# Patient Record
Sex: Female | Born: 1990 | Race: Black or African American | Hispanic: No | Marital: Married | Smoking: Current every day smoker
Health system: Southern US, Community
[De-identification: ages and names within clinical notes are randomized; demographics above are authoritative.]

## PROBLEM LIST (undated history)

## (undated) ENCOUNTER — Inpatient Hospital Stay (HOSPITAL_COMMUNITY): Payer: Self-pay

## (undated) ENCOUNTER — Emergency Department (HOSPITAL_COMMUNITY): Admission: EM | Payer: Medicaid Other

## (undated) ENCOUNTER — Emergency Department (HOSPITAL_COMMUNITY): Payer: Medicaid Other

## (undated) DIAGNOSIS — O139 Gestational [pregnancy-induced] hypertension without significant proteinuria, unspecified trimester: Secondary | ICD-10-CM

## (undated) DIAGNOSIS — F32A Depression, unspecified: Secondary | ICD-10-CM

## (undated) DIAGNOSIS — Z8614 Personal history of Methicillin resistant Staphylococcus aureus infection: Secondary | ICD-10-CM

## (undated) DIAGNOSIS — F329 Major depressive disorder, single episode, unspecified: Secondary | ICD-10-CM

---

## 2013-05-08 ENCOUNTER — Emergency Department (INDEPENDENT_AMBULATORY_CARE_PROVIDER_SITE_OTHER)
Admission: EM | Admit: 2013-05-08 | Discharge: 2013-05-08 | Disposition: A | Discharge status: Home / Self Care | Source: Home / Self Care

## 2013-05-08 ENCOUNTER — Encounter (HOSPITAL_COMMUNITY): Payer: Self-pay | Admitting: Emergency Medicine

## 2013-05-08 DIAGNOSIS — L03317 Cellulitis of buttock: Secondary | ICD-10-CM

## 2013-05-08 DIAGNOSIS — L0231 Cutaneous abscess of buttock: Secondary | ICD-10-CM

## 2013-05-08 MED ORDER — HYDROMORPHONE HCL 1 MG/ML IJ SOLN
2.0000 mg | Freq: Once | INTRAMUSCULAR | Status: AC
  Administered 2013-05-08: 2 mg via INTRAMUSCULAR

## 2013-05-08 MED ORDER — HYDROMORPHONE HCL PF 1 MG/ML IJ SOLN
INTRAMUSCULAR | Status: AC
  Filled 2013-05-08: qty 2

## 2013-05-08 MED ORDER — OXYCODONE-ACETAMINOPHEN 10-325 MG PO TABS: 1.0000 | ORAL_TABLET | ORAL | Status: DC | PRN

## 2013-05-08 MED ORDER — SULFAMETHOXAZOLE-TRIMETHOPRIM 800-160 MG PO TABS: 1.0000 | ORAL_TABLET | Freq: Two times a day (BID) | ORAL | Status: AC

## 2013-05-08 MED ORDER — ONDANSETRON 4 MG PO TBDP
ORAL_TABLET | ORAL | Status: AC
  Filled 2013-05-08: qty 1

## 2013-05-08 MED ORDER — ONDANSETRON 4 MG PO TBDP
4.0000 mg | ORAL_TABLET | Freq: Once | ORAL | Status: AC
  Administered 2013-05-08: 4 mg via ORAL

## 2013-05-08 NOTE — ED Provider Notes (Signed)
CSN: [id number]     Arrival date & time 05/08/13  1017 History   First MD Initiated Contact with Patient 05/08/13 1142     Chief Complaint  Patient presents with  . Recurrent Skin Infections   (Consider location/radiation/quality/duration/timing/severity/associated sxs/prior Treatment) HPI Comments: 23 year old female complains of a boil on left buttock for 3 days. Gradually getting worse to clear the last 12 hours. The pain is rated 10 out of 10 she is crying due to the pain.   History reviewed. No pertinent past medical history. History reviewed. No pertinent past surgical history. History reviewed. No pertinent family history. History  Substance Use Topics  . Smoking status: Never Smoker   . Smokeless tobacco: Not on file  . Alcohol Use: No   OB History   Grav Para Term Preterm Abortions TAB SAB Ect Mult Living                 Review of Systems  Constitutional: Positive for activity change. Negative for fever.  HENT: Negative.   Respiratory: Negative.   Genitourinary: Negative.   Skin:       Abscess to the left buttock as above.  Neurological: Negative.     Allergies  Review of patient's allergies indicates no known allergies.  Home Medications   Current Outpatient Rx  Name  Route  Sig  Dispense  Refill  . oxyCODONE-acetaminophen (PERCOCET) 10-325 MG per tablet   Oral   Take 1 tablet by mouth every 4 (four) hours as needed for pain.   15 tablet   0   . sulfamethoxazole-trimethoprim (BACTRIM DS,SEPTRA DS) 800-160 MG per tablet   Oral   Take 1 tablet by mouth 2 (two) times daily.   14 tablet   0    BP 141/89  Pulse 132  Temp(Src) 98.6 F (37 C) (Oral)  Resp 20  SpO2 100%  LMP 05/01/2013 Physical Exam  Vitals reviewed. Constitutional: She is oriented to person, place, and time. She appears well-developed and well-nourished.  Neck: Normal range of motion. Neck supple.  Pulmonary/Chest: Effort normal. No respiratory distress.  Musculoskeletal: She  exhibits no edema and no tenderness.  Neurological: She is alert and oriented to person, place, and time. She exhibits normal muscle tone.  Skin: Skin is warm and dry.  Left buttock wound located adjacent to the anus. It does not involve the anus or extend into the gluteal cleft. There is swelling, severe tenderness measuring 7 x 4 cm.  Psychiatric: She has a normal mood and affect.    ED Course  INCISION AND DRAINAGE Date/Time: 05/08/2013 12:20 PM Performed by: Phineas RealMABE, Joshva Labreck Authorized by: Leslee HomeKELLER, Krystol Rocco C Consent: Verbal consent obtained. Risks and benefits: risks, benefits and alternatives were discussed Consent given by: patient Patient understanding: patient states understanding of the procedure being performed Patient identity confirmed: verbally with patient Type: abscess Body area: lower extremity Location details: left buttock Anesthesia: local infiltration Local anesthetic: lidocaine 2% without epinephrine Anesthetic total: 10 ml Patient sedated: yes Analgesia: hydromorphone Scalpel size: 11 Incision type: single with marsupialization Complexity: complex Drainage: purulent Drainage amount: copious Wound treatment: drain placed Packing material: 1/4 in gauze Comments: This is a large deep abscess under tension that produced a large amount of purulence. Most of the purulence was evacuated under tension and compression. Minor to moderate marsupialization was created with blunt hemostat. The wound was then packed and a dressing applied.   (including critical care time) Labs Review Labs Reviewed  CULTURE, ROUTINE-ABSCESS   Imaging Review  No results found.    MDM   1. Abscess of buttock, left       Incision and drainage of a large abscess Wound culture obtained Percocet 10 mg one every 4-6 hours when necessary pain #15 Septra DS one twice a day for 7 days Warm compresses Try not to pull the packing out. Return to urgent care 2 days for wound check and packing  removal. For worsening new symptoms or problems sooner than 2 days return promptly.    Hayden Rasmussen, NP 05/08/13 1245

## 2013-05-08 NOTE — ED Notes (Signed)
Pt   Reports      Pain       Boil    l   Thigh        X  3     Days  With  Pain  /  Swelling         Present

## 2013-05-08 NOTE — ED Provider Notes (Signed)
Medical screening examination/treatment/procedure(s) were performed by non-physician practitioner and as supervising physician I was immediately available for consultation/collaboration.  Anamaria Dusenbury, M.D.   Merrie Epler C Tanicia Wolaver, MD 05/08/13 1529 

## 2013-05-08 NOTE — Discharge Instructions (Signed)
Abscess °An abscess is an infected area that contains a collection of pus and debris. It can occur in almost any part of the body. An abscess is also known as a furuncle or boil. °CAUSES  °An abscess occurs when tissue gets infected. This can occur from blockage of oil or sweat glands, infection of hair follicles, or a minor injury to the skin. As the body tries to fight the infection, pus collects in the area and creates pressure under the skin. This pressure causes pain. People with weakened immune systems have difficulty fighting infections and get certain abscesses more often.  °SYMPTOMS °Usually an abscess develops on the skin and becomes a painful mass that is red, warm, and tender. If the abscess forms under the skin, you may feel a moveable soft area under the skin. Some abscesses break open (rupture) on their own, but most will continue to get worse without care. The infection can spread deeper into the body and eventually into the bloodstream, causing you to feel ill.  °DIAGNOSIS  °Your caregiver will take your medical history and perform a physical exam. A sample of fluid may also be taken from the abscess to determine what is causing your infection. °TREATMENT  °Your caregiver may prescribe antibiotic medicines to fight the infection. However, taking antibiotics alone usually does not cure an abscess. Your caregiver may need to make a small cut (incision) in the abscess to drain the pus. In some cases, gauze is packed into the abscess to reduce pain and to continue draining the area. °HOME CARE INSTRUCTIONS  °· Only take over-the-counter or prescription medicines for pain, discomfort, or fever as directed by your caregiver. °· If you were prescribed antibiotics, take them as directed. Finish them even if you start to feel better. °· If gauze is used, follow your caregiver's directions for changing the gauze. °· To avoid spreading the infection: °· Keep your draining abscess covered with a  bandage. °· Wash your hands well. °· Do not share personal care items, towels, or whirlpools with others. °· Avoid skin contact with others. °· Keep your skin and clothes clean around the abscess. °· Keep all follow-up appointments as directed by your caregiver. °SEEK MEDICAL CARE IF:  °· You have increased pain, swelling, redness, fluid drainage, or bleeding. °· You have muscle aches, chills, or a general ill feeling. °· You have a fever. °MAKE SURE YOU:  °· Understand these instructions. °· Will watch your condition. °· Will get help right away if you are not doing well or get worse. °Document Released: 01/25/2005 Document Revised: 10/17/2011 Document Reviewed: 06/30/2011 °ExitCare® Patient Information ©2014 [employer]. ° °Community-Associated MRSA °CA-MRSA stands for community-associated methicillin-resistant Staphylococcus aureus. MRSA is a type of bacteria that is resistant to some common antibiotics. It can cause infections in the skin and many other places in the body. Staphylococcus aureus, often called "staph," is a bacteria that normally lives on the skin or in the nose. Staph on the surface of the skin or in the nose does not cause problems. However, if the staph enters the body through a cut, wound, or break in the skin, an infection can happen. °Up until recently, infections with the MRSA type of staph mainly occurred in hospitals and other healthcare settings. There are now increasing problems with MRSA infections in the community as well. Infections with MRSA may be very serious or even life-threatening. °CA-MRSA is becoming more common. It is known to spread in crowded settings, in jails and prisons, and   in situations where there is close skin-to-skin contact, such as during sporting events or in locker rooms. MRSA can be spread through shared items, such as children's toys, razors, towels, or sports equipment.  °CAUSES °All staph, including MRSA, are normally harmless unless they enter the body  through a scratch, cut, or wound, such as with surgery. All staph, including MRSA, can be spread from person-to-person by touching contaminated objects or through direct contact. °· MRSA now causes illness in people who have not been in hospitals or other healthcare facilities. Cases of MRSA diseases in the community have been associated with: °· Recent antibiotic use. °· Sharing contaminated towels or clothes. °· Having active skin diseases. °· Participating in contact sports. °· Living in crowded settings. °· Intravenous (IV) drug use. °· Community-associated MRSA infections are usually skin infections, but may cause other severe illnesses. °· Staph bacteria are one of the most common causes of skin infection. However, they are also a common cause of pneumonia, bone or joint infections, and bloodstream infections. °DIAGNOSIS °Diagnosis of MRSA is done by cultures of fluid samples that may come from: °· Swabs taken from cuts or wounds in infected areas. °· Nasal swabs. °· Saliva or deep cough specimens from the lungs (sputum). °· Urine. °· Blood. °Many people are "colonized" with MRSA but have no signs of infection. This means that people carry the MRSA germ on their skin or in their nose and may never develop MRSA infection.  °TREATMENT  °Treatment varies and is based on how serious, how deep, or how extensive the infection is. For example: °· Some skin infections, such as a small boil or abscess, may be treated by draining yellowish-white fluid (pus) from the site of the infection. °· Deeper or more widespread soft tissue infections are usually treated with surgery to drain pus and with antibiotic medicine given by vein or by mouth. This may be recommended even if you are pregnant. °· Serious infections may require a hospital stay. °If antibiotics are given, they may be needed for several weeks. °PREVENTION °Because many people are colonized with staph, including MRSA, preventing the spread of the bacteria from  person-to-person is most important. The best way to prevent the spread of bacteria and other germs is through proper hand washing or by using alcohol-based hand disinfectants. The following are other ways to help prevent MRSA infection within community settings.  °· Wash your hands frequently with soap and water for at least 15 seconds. Otherwise, use alcohol-based hand disinfectants when soap and water is not available. °· Make sure people who live with you wash their hands often, too. °· Do not share personal items. For example, avoid sharing razors and other personal hygiene items, towels, clothing, and athletic equipment. °· Wash and dry your clothes and bedding at the warmest temperatures recommended on the labels. °· Keep wounds covered. Pus from infected sores may contain MRSA and other bacteria. Keep cuts and abrasions clean and covered with germ-free (sterile), dry bandages until they are healed. °· If you have a wound that appears infected, ask your caregiver if a culture for MRSA and other bacteria should be done. °· If you are breastfeeding, talk to your caregiver about MRSA. You may be asked to temporarily stop breastfeeding. °HOME CARE INSTRUCTIONS  °· Take your antibiotics as directed. Finish them even if you start to feel better. °· Avoid close contact with those around you as much as possible. Do not use towels, razors, toothbrushes, bedding, or other items that   will be used by others. °· To fight the infection, follow your caregiver's instructions for wound care. Wash your hands before and after changing your bandages. °· If you have an intravascular device, such as a catheter, make sure you know how to care for it. °· Be sure to tell any healthcare providers that you have MRSA so they are aware of your infection. °SEEK IMMEDIATE MEDICAL CARE IF: °· The infection appears to be getting worse. Signs include: °· Increased warmth, redness, or tenderness around the wound site. °· A red line that extends  from the infection site. °· A dark color in the area around the infection. °· Wound drainage that is tan, yellow, or green. °· A bad smell coming from the wound. °· You feel sick to your stomach (nauseous) and throw up (vomit) or cannot keep medicine down. °· You have a fever. °· Your baby is older than 3 months with a rectal temperature of 102° F (38.9° C) or higher. °· Your baby is 3 months old or younger with a rectal temperature of 100.4° F (38° C) or higher. °· You have difficulty breathing. °MAKE SURE YOU:  °· Understand these instructions. °· Will watch your condition. °· Will get help right away if you are not doing well or get worse. °Document Released: 07/21/2005 Document Revised: 07/10/2011 Document Reviewed: 07/21/2010 °ExitCare® Patient Information ©2014 [employer]. ° °

## 2013-05-10 ENCOUNTER — Encounter (HOSPITAL_COMMUNITY): Payer: Self-pay | Admitting: Emergency Medicine

## 2013-05-10 ENCOUNTER — Emergency Department (INDEPENDENT_AMBULATORY_CARE_PROVIDER_SITE_OTHER)
Admission: EM | Admit: 2013-05-10 | Discharge: 2013-05-10 | Disposition: A | Discharge status: Home / Self Care | Source: Home / Self Care | Attending: Family Medicine | Admitting: Family Medicine

## 2013-05-10 DIAGNOSIS — Z4800 Encounter for change or removal of nonsurgical wound dressing: Secondary | ICD-10-CM

## 2013-05-10 DIAGNOSIS — L03317 Cellulitis of buttock: Secondary | ICD-10-CM

## 2013-05-10 DIAGNOSIS — L0231 Cutaneous abscess of buttock: Secondary | ICD-10-CM

## 2013-05-10 LAB — CULTURE, ROUTINE-ABSCESS

## 2013-05-10 NOTE — Discharge Instructions (Signed)
Thank you for coming in today. Continue antibiotics. Remove the packing material in the bathtub in 2-3 days. It is okay if it comes out sooner. Come back if getting worse. If not getting better please followup with central  surgery  Abscess Care After An abscess (also called a boil or furuncle) is an infected area that contains a collection of pus. Signs and symptoms of an abscess include pain, tenderness, redness, or hardness, or you may feel a moveable soft area under your skin. An abscess can occur anywhere in the body. The infection may spread to surrounding tissues causing cellulitis. A cut (incision) by the surgeon was made over your abscess and the pus was drained out. Gauze may have been packed into the space to provide a drain that will allow the cavity to heal from the inside outwards. The boil may be painful for 5 to 7 days. Most people with a boil do not have high fevers. Your abscess, if seen early, may not have localized, and may not have been lanced. If not, another appointment may be required for this if it does not get better on its own or with medications. HOME CARE INSTRUCTIONS   Only take over-the-counter or prescription medicines for pain, discomfort, or fever as directed by your caregiver.  When you bathe, soak and then remove gauze or iodoform packs at least daily or as directed by your caregiver. You may then wash the wound gently with mild soapy water. Repack with gauze or do as your caregiver directs. SEEK IMMEDIATE MEDICAL CARE IF:   You develop increased pain, swelling, redness, drainage, or bleeding in the wound site.  You develop signs of generalized infection including muscle aches, chills, fever, or a general ill feeling.  An oral temperature above 102 F (38.9 C) develops, not controlled by medication. See your caregiver for a recheck if you develop any of the symptoms described above. If medications (antibiotics) were prescribed, take them as  directed. Document Released: 11/03/2004 Document Revised: 07/10/2011 Document Reviewed: 07/01/2007 [facility] Patient Information 2014 [place]Care, [place].

## 2013-05-10 NOTE — ED Notes (Signed)
Pt is here for a f/u on abscess on left glut Reports drainage; taking all meds and tolerating well Alert w/no signs of acute distress.

## 2013-05-10 NOTE — ED Provider Notes (Signed)
[name] Young is a 23 y.o. female who presents to Urgent Care today for followup left buttocks abscess. This was incised drained and packed 2 days ago. Culture is pending however preliminary results show staph aureus. Patient notes that her pain is much better however still present. She denies any fevers chills nausea vomiting or diarrhea. She has not changed the dressing herself yet.   History reviewed. No pertinent past medical history. History  Substance Use Topics  . Smoking status: Never Smoker   . Smokeless tobacco: Not on file  . Alcohol Use: No   ROS as above Medications reviewed. No current facility-administered medications for this encounter.   Current Outpatient Prescriptions  Medication Sig Dispense Refill  . oxyCODONE-acetaminophen (PERCOCET) 10-325 MG per tablet Take 1 tablet by mouth every 4 (four) hours as needed for pain.  15 tablet  0  . sulfamethoxazole-trimethoprim (BACTRIM DS,SEPTRA DS) 800-160 MG per tablet Take 1 tablet by mouth 2 (two) times daily.  14 tablet  0    Exam:  BP 137/81  Pulse 93  Temp(Src) 98.1 F (36.7 C) (Oral)  Resp 18  SpO2 100%  LMP 05/01/2013 Gen: Well NAD LEFT BUTTOCKS:  Incision present. Packing material removed in a small amount of pus was expressed as well. The surrounding skin is mildly indurated and erythematous and moderately tender. The abscess was loosely packed with about 3 inches of quarter inch packing material. Patient tolerated procedure well  Assessment and Plan: 23 y.o. female with abscess incision and drainage followup. Patient is doing well but not completely better. Plan to continue antibiotics. The wound is repacked today. Recommend patient remove the packing material herself in 2-3 days. If not getting better recommend followup with [facility] or [facility]   Discussed warning signs or symptoms. Please see discharge instructions. Patient expresses understanding.    Rodolph BongEvan S  Kamry Faraci, MD 05/10/13 [phone]

## 2013-05-14 ENCOUNTER — Telehealth (HOSPITAL_COMMUNITY): Payer: Self-pay | Admitting: *Deleted

## 2013-05-14 NOTE — ED Notes (Signed)
Abscess culture L buttocks: Mod. MRSA. Pt. adequately treated with Bactrim DS.  I called and left a message to call.  Call 1.

## 2013-05-18 NOTE — ED Notes (Signed)
Unable to reach pt. X 3.  Letter sent with result and [facility] MRSA instructions. Vassie MoselleYork, Kohle Winner M 05/18/2013

## 2013-08-14 ENCOUNTER — Encounter: Payer: Self-pay | Admitting: Obstetrics & Gynecology

## 2013-08-14 ENCOUNTER — Ambulatory Visit (INDEPENDENT_AMBULATORY_CARE_PROVIDER_SITE_OTHER): Admitting: Obstetrics & Gynecology

## 2013-08-14 VITALS — BP 135/62 | Temp 98.3°F | Ht 63.0 in | Wt 124.0 lb

## 2013-08-14 DIAGNOSIS — Z3400 Encounter for supervision of normal first pregnancy, unspecified trimester: Secondary | ICD-10-CM

## 2013-08-14 LAB — POCT URINALYSIS DIPSTICK
Bilirubin, UA: NEGATIVE
Glucose, UA: NEGATIVE
KETONES UA: NEGATIVE
Leukocytes, UA: NEGATIVE
Nitrite, UA: NEGATIVE
PH UA: 6
PROTEIN UA: NEGATIVE
RBC UA: NEGATIVE
Spec Grav, UA: 1.02
UROBILINOGEN UA: NEGATIVE

## 2013-08-14 LAB — POCT URINE PREGNANCY: Preg Test, Ur: POSITIVE

## 2013-08-14 NOTE — Progress Notes (Deleted)
, °

## 2013-08-14 NOTE — Progress Notes (Signed)
Pulse- 79  Subjective:    [name] Young is being seen today for her first obstetrical visit.  This is not a planned pregnancy. She is at 8261w4d gestation. Her obstetrical history is significant for none. Relationship with FOB: not together, unsure of involvment. Patient does not intend to breast feed. Pregnancy history fully reviewed.  The information documented in the HPI was reviewed and verified.  Menstrual History: OB History   Grav Para Term Preterm Abortions TAB SAB Ect Mult Living   1               Last Pap: 2013 WNL Menarche age: [age]  Patient's last menstrual period was 06/29/2013.    Past Medical History  Diagnosis Date  . Medical history non-contributory     Past Surgical History  Procedure Laterality Date  . No past surgeries      No current outpatient prescriptions on file.   No current facility-administered medications for this visit.   No Known Allergies  History  Substance Use Topics  . Smoking status: Never Smoker   . Smokeless tobacco: Never Used  . Alcohol Use: No    Family History  Problem Relation Age of Onset  . Hypertension Mother   . Hypertension Father      Review of Systems Constitutional: negative for weight loss Gastrointestinal: negative for vomiting Genitourinary:negative for genital lesions and vaginal discharge and dysuria Musculoskeletal:negative for back pain Behavioral/Psych: negative for abusive relationship, depression, illegal drug usage and tobacco use    Objective:     General Appearance:    Alert, cooperative, no distress, appears stated age  Head:    Normocephalic, without obvious abnormality, atraumatic  Eyes:    PERRL, conjunctiva/corneas clear, EOM's intact, fundi    benign, both eyes  Ears:    Normal TM's and external ear canals, both ears  Nose:   Nares normal, septum midline, mucosa normal, no drainage    or sinus tenderness  Throat:   Lips, mucosa, and tongue normal; teeth and gums normal  Neck:   Supple,  symmetrical, trachea midline, no adenopathy;    thyroid:  no enlargement/tenderness/nodules; no carotid   bruit or JVD  Back:     Symmetric, no curvature, ROM normal, no CVA tenderness  Lungs:     Clear to auscultation bilaterally, respirations unlabored  Chest Wall:    No tenderness or deformity   Heart:    Regular rate and rhythm, S1 and S2 normal, no murmur, rub   or gallop  Breast Exam:    No tenderness, masses, or nipple abnormality  Abdomen:     Soft, non-tender, bowel sounds active all four quadrants,    no masses, no organomegaly  Genitalia:    Normal female without lesion, discharge or tenderness  Extremities:   Extremities normal, atraumatic, no cyanosis or edema  Pulses:   2+ and symmetric all extremities  Skin:   Skin color, texture, turgor normal, no rashes or lesions  Lymph nodes:   Cervical, supraclavicular, and axillary nodes normal  Neurologic:   CNII-XII intact, normal strength, sensation and reflexes    throughout    Informal U/S: cardiac activity present/CRL: 932w1d  Lab Review Urine pregnancy test Labs reviewed no Radiologic studies reviewed yes Assessment:    Pregnancy at 2361w4d weeks    Plan:      Orders Placed This Encounter  Procedures  . Culture, OB Urine  . WET PREP BY MOLECULAR PROBE  . GC/Chlamydia Probe Amp  . Obstetric panel  .  HIV antibody  . Hemoglobinopathy evaluation  . Varicella zoster antibody, IgG  . Vit D  25 hydroxy (rtn osteoporosis monitoring)  . POCT urine pregnancy  . POCT urinalysis dipstick    Prenatal vitamins.  Counseling provided regarding continued use of seat belts, cessation of alcohol consumption, smoking or use of illicit drugs; infection precautions i.e., influenza/TDAP immunizations, toxoplasmosis,CMV, parvovirus, listeria and varicella; workplace safety, exercise during pregnancy; routine dental care, safe medications, sexual activity, hot tubs, saunas, pools, travel, caffeine use, fish and methlymercury, potential  toxins, hair treatments, varicose veins Weight gain recommendations per IOM guidelines reviewed:  normal weight/BMI 18.5 - 24.9--> gain 25 - 35 lbs Problem list reviewed and updated. FIRST/CF mutation testing/QUAD SCREEN/fragile X/Ashkenazi Jewish population testing/Spinal muscular atrophy discussed Role of ultrasound in pregnancy discussed Amniocentesis discussed: not indicated. Follow up in 4 weeks.

## 2013-08-15 DIAGNOSIS — Z3400 Encounter for supervision of normal first pregnancy, unspecified trimester: Secondary | ICD-10-CM | POA: Insufficient documentation

## 2013-08-15 LAB — OBSTETRIC PANEL
Antibody Screen: NEGATIVE
Basophils Absolute: 0.1 10*3/uL (ref 0.0–0.1)
Basophils Relative: 1 % (ref 0–1)
EOS ABS: 0.1 10*3/uL (ref 0.0–0.7)
EOS PCT: 2 % (ref 0–5)
HCT: 33.5 % — ABNORMAL LOW (ref 36.0–46.0)
HEMOGLOBIN: 11.1 g/dL — AB (ref 12.0–15.0)
HEP B S AG: NEGATIVE
LYMPHS ABS: 1.9 10*3/uL (ref 0.7–4.0)
LYMPHS PCT: 29 % (ref 12–46)
MCH: 26.8 pg (ref 26.0–34.0)
MCHC: 33.1 g/dL (ref 30.0–36.0)
MCV: 80.9 fL (ref 78.0–100.0)
Monocytes Absolute: 0.6 10*3/uL (ref 0.1–1.0)
Monocytes Relative: 9 % (ref 3–12)
Neutro Abs: 3.8 10*3/uL (ref 1.7–7.7)
Neutrophils Relative %: 59 % (ref 43–77)
PLATELETS: 328 10*3/uL (ref 150–400)
RBC: 4.14 MIL/uL (ref 3.87–5.11)
RDW: 14.2 % (ref 11.5–15.5)
RUBELLA: 0.81 {index} (ref <0.90)
Rh Type: NEGATIVE
WBC: 6.4 10*3/uL (ref 4.0–10.5)

## 2013-08-15 LAB — CULTURE, OB URINE
Colony Count: NO GROWTH
ORGANISM ID, BACTERIA: NO GROWTH

## 2013-08-15 LAB — WET PREP BY MOLECULAR PROBE
Candida species: NEGATIVE
Gardnerella vaginalis: POSITIVE — AB
Trichomonas vaginosis: NEGATIVE

## 2013-08-15 LAB — GC/CHLAMYDIA PROBE AMP
CT PROBE, AMP APTIMA: NEGATIVE
GC Probe RNA: NEGATIVE

## 2013-08-15 LAB — HIV ANTIBODY (ROUTINE TESTING W REFLEX): HIV: NONREACTIVE

## 2013-08-15 LAB — VITAMIN D 25 HYDROXY (VIT D DEFICIENCY, FRACTURES): VIT D 25 HYDROXY: 10 ng/mL — AB (ref 30–89)

## 2013-08-15 LAB — VARICELLA ZOSTER ANTIBODY, IGG: Varicella IgG: 125.5 Index (ref <135.00)

## 2013-08-15 NOTE — Patient Instructions (Signed)
Pregnancy - First Trimester  During sexual intercourse, millions of sperm go into the vagina. Only 1 sperm will penetrate and fertilize the female egg while it is in the Fallopian tube. One week later, the fertilized egg implants into the wall of the uterus. An embryo begins to develop into a baby. At 6 to 8 weeks, the eyes and face are formed and the heartbeat can be seen on ultrasound. At the end of 12 weeks (first trimester), all the baby's organs are formed. Now that you are pregnant, you will want to do everything you can to have a healthy baby. Two of the most important things are to get good prenatal care and follow your caregiver's instructions. Prenatal care is all the medical care you receive before the baby's birth. It is given to prevent, find, and treat problems during the pregnancy and childbirth.  PRENATAL EXAMS  · During prenatal visits, your weight, blood pressure, and urine are checked. This is done to make sure you are healthy and progressing normally during the pregnancy.  · A pregnant woman should gain 25 to 35 pounds during the pregnancy. However, if you are overweight or underweight, your caregiver will advise you regarding your weight.  · Your caregiver will ask and answer questions for you.  · Blood work, cervical cultures, other necessary tests, and a Pap test are done during your prenatal exams. These tests are done to check on your health and the probable health of your baby. Tests are strongly recommended and done for HIV with your permission. This is the virus that causes AIDS. These tests are done because medicines can be given to help prevent your baby from being born with this infection should you have been infected without knowing it. Blood work is also used to find out your blood type, previous infections, and follow your blood levels (hemoglobin).  · Low hemoglobin (anemia) is common during pregnancy. Iron and vitamins are given to help prevent this. Later in the pregnancy, blood  tests for diabetes will be done along with any other tests if any problems develop.  · You may need other tests to make sure you and the baby are doing well.  CHANGES DURING THE FIRST TRIMESTER   Your body goes through many changes during pregnancy. They vary from person to person. Talk to your caregiver about changes you notice and are concerned about. Changes can include:  · Your menstrual period stops.  · The egg and sperm carry the genes that determine what you look like. Genes from you and your partner are forming a baby. The female genes determine whether the baby is a boy or a girl.  · Your body increases in girth and you may feel bloated.  · Feeling sick to your stomach (nauseous) and throwing up (vomiting). If the vomiting is uncontrollable, call your caregiver.  · Your breasts will begin to enlarge and become tender.  · Your nipples may stick out more and become darker.  · The need to urinate more. Painful urination may mean you have a bladder infection.  · Tiring easily.  · Loss of appetite.  · Cravings for certain kinds of food.  · At first, you may gain or lose a couple of pounds.  · You may have changes in your emotions from day to day (excited to be pregnant or concerned something may go wrong with the pregnancy and baby).  · You may have more vivid and strange dreams.  HOME CARE INSTRUCTIONS   ·   It is very important to avoid all smoking, alcohol and non-prescribed drugs during your pregnancy. These affect the formation and growth of the baby. Avoid chemicals while pregnant to ensure the delivery of a healthy infant.  · Start your prenatal visits by the 12th week of pregnancy. They are usually scheduled monthly at first, then more often in the last 2 months before delivery. Keep your caregiver's appointments. Follow your caregiver's instructions regarding medicine use, blood and lab tests, exercise, and diet.  · During pregnancy, you are providing food for you and your baby. Eat regular, well-balanced  meals. Choose foods such as meat, fish, milk and other low fat dairy products, vegetables, fruits, and whole-grain breads and cereals. Your caregiver will tell you of the ideal weight gain.  · You can help morning sickness by keeping soda crackers at the bedside. Eat a couple before arising in the morning. You may want to use the crackers without salt on them.  · Eating 4 to 5 small meals rather than 3 large meals a day also may help the nausea and vomiting.  · Drinking liquids between meals instead of during meals also seems to help nausea and vomiting.  · A physical sexual relationship may be continued throughout pregnancy if there are no other problems. Problems may be early (premature) leaking of amniotic fluid from the membranes, vaginal bleeding, or belly (abdominal) pain.  · Exercise regularly if there are no restrictions. Check with your caregiver or physical therapist if you are unsure of the safety of some of your exercises. Greater weight gain will occur in the last 2 trimesters of pregnancy. Exercising will help:  · Control your weight.  · Keep you in shape.  · Prepare you for labor and delivery.  · Help you lose your pregnancy weight after you deliver your baby.  · Wear a good support or jogging bra for breast tenderness during pregnancy. This may help if worn during sleep too.  · Ask when prenatal classes are available. Begin classes when they are offered.  · Do not use hot tubs, steam rooms, or saunas.  · Wear your seat belt when driving. This protects you and your baby if you are in an accident.  · Avoid raw meat, uncooked cheese, cat litter boxes, and soil used by cats throughout the pregnancy. These carry germs that can cause birth defects in the baby.  · The first trimester is a good time to visit your dentist for your dental health. Getting your teeth cleaned is okay. Use a softer toothbrush and brush gently during pregnancy.  · Ask for help if you have financial, counseling, or nutritional needs  during pregnancy. Your caregiver will be able to offer counseling for these needs as well as refer you for other special needs.  · Do not take any medicines or herbs unless told by your caregiver.  · Inform your caregiver if there is any mental or physical domestic violence.  · Make a list of emergency phone numbers of family, friends, hospital, and police and fire departments.  · Write down your questions. Take them to your prenatal visit.  · Do not douche.  · Do not cross your legs.  · If you have to stand for long periods of time, rotate you feet or take small steps in a circle.  · You may have more vaginal secretions that may require a sanitary pad. Do not use tampons or scented sanitary pads.  MEDICINES AND DRUG USE IN PREGNANCY  ·   Take prenatal vitamins as directed. The vitamin should contain 1 milligram of folic acid. Keep all vitamins out of reach of children. Only a couple vitamins or tablets containing iron may be fatal to a baby or young child when ingested.  · Avoid use of all medicines, including herbs, over-the-counter medicines, not prescribed or suggested by your caregiver. Only take over-the-counter or prescription medicines for pain, discomfort, or fever as directed by your caregiver. Do not use aspirin, ibuprofen, or naproxen unless directed by your caregiver.  · Let your caregiver also know about herbs you may be using.  · Alcohol is related to a number of birth defects. This includes fetal alcohol syndrome. All alcohol, in any form, should be avoided completely. Smoking will cause low birth rate and premature babies.  · Street or illegal drugs are very harmful to the baby. They are absolutely forbidden. A baby born to an addicted mother will be addicted at birth. The baby will go through the same withdrawal an adult does.  · Let your caregiver know about any medicines that you have to take and for what reason you take them.  SEEK MEDICAL CARE IF:   You have any concerns or worries during your  pregnancy. It is better to call with your questions if you feel they cannot wait, rather than worry about them.  SEEK IMMEDIATE MEDICAL CARE IF:   · An unexplained oral temperature above 102° F (38.9° C) develops, or as your caregiver suggests.  · You have leaking of fluid from the vagina (birth canal). If leaking membranes are suspected, take your temperature and inform your caregiver of this when you call.  · There is vaginal spotting or bleeding. Notify your caregiver of the amount and how many pads are used.  · You develop a bad smelling vaginal discharge with a change in the color.  · You continue to feel sick to your stomach (nauseated) and have no relief from remedies suggested. You vomit blood or coffee ground-like materials.  · You lose more than 2 pounds of weight in 1 week.  · You gain more than 2 pounds of weight in 1 week and you notice swelling of your face, hands, feet, or legs.  · You gain 5 pounds or more in 1 week (even if you do not have swelling of your hands, face, legs, or feet).  · You get exposed to German measles and have never had them.  · You are exposed to fifth disease or chickenpox.  · You develop belly (abdominal) pain. Round ligament discomfort is a common non-cancerous (benign) cause of abdominal pain in pregnancy. Your caregiver still must evaluate this.  · You develop headache, fever, diarrhea, pain with urination, or shortness of breath.  · You fall or are in a car accident or have any kind of trauma.  · There is mental or physical violence in your home.  Document Released: 04/11/2001 Document Revised: 01/10/2012 Document Reviewed: 10/13/2008  [employer]® Patient Information ©2014 [employer].

## 2013-08-18 LAB — HEMOGLOBINOPATHY EVALUATION
HEMOGLOBIN OTHER: 0 %
HGB S QUANTITAION: 0 %
Hgb A2 Quant: 2.6 % (ref 2.2–3.2)
Hgb A: 97 % (ref 96.8–97.8)
Hgb F Quant: 0.4 % (ref 0.0–2.0)

## 2013-08-22 ENCOUNTER — Encounter: Payer: Self-pay | Admitting: Advanced Practice Midwife

## 2013-08-24 ENCOUNTER — Encounter: Payer: Self-pay | Admitting: Obstetrics & Gynecology

## 2013-08-24 DIAGNOSIS — O26899 Other specified pregnancy related conditions, unspecified trimester: Secondary | ICD-10-CM | POA: Insufficient documentation

## 2013-08-24 DIAGNOSIS — E559 Vitamin D deficiency, unspecified: Secondary | ICD-10-CM | POA: Insufficient documentation

## 2013-08-24 DIAGNOSIS — Z6791 Unspecified blood type, Rh negative: Secondary | ICD-10-CM

## 2013-08-24 NOTE — Progress Notes (Signed)
Quick Note:  Needs PNV w/vitamin D. RH neg ______

## 2013-08-29 ENCOUNTER — Other Ambulatory Visit: Payer: Self-pay | Admitting: *Deleted

## 2013-08-29 DIAGNOSIS — N760 Acute vaginitis: Secondary | ICD-10-CM

## 2013-08-29 DIAGNOSIS — Z3400 Encounter for supervision of normal first pregnancy, unspecified trimester: Secondary | ICD-10-CM

## 2013-08-29 DIAGNOSIS — B9689 Other specified bacterial agents as the cause of diseases classified elsewhere: Secondary | ICD-10-CM

## 2013-08-29 MED ORDER — OB COMPLETE ONE 50-1-476 MG PO CAPS: 1.0000 | ORAL_CAPSULE | Freq: Every day | ORAL | Status: DC

## 2013-08-29 MED ORDER — METRONIDAZOLE 500 MG PO TABS: 500.0000 mg | ORAL_TABLET | Freq: Two times a day (BID) | ORAL | Status: DC

## 2013-09-04 ENCOUNTER — Emergency Department (HOSPITAL_COMMUNITY): Payer: Medicaid Other

## 2013-09-04 ENCOUNTER — Encounter (HOSPITAL_COMMUNITY): Payer: Self-pay | Admitting: Emergency Medicine

## 2013-09-04 ENCOUNTER — Emergency Department (HOSPITAL_COMMUNITY)
Admission: EM | Admit: 2013-09-04 | Discharge: 2013-09-04 | Disposition: A | Discharge status: Home / Self Care | Payer: Medicaid Other | Attending: Emergency Medicine | Admitting: Emergency Medicine

## 2013-09-04 DIAGNOSIS — O2390 Unspecified genitourinary tract infection in pregnancy, unspecified trimester: Secondary | ICD-10-CM | POA: Insufficient documentation

## 2013-09-04 DIAGNOSIS — R109 Unspecified abdominal pain: Secondary | ICD-10-CM | POA: Insufficient documentation

## 2013-09-04 DIAGNOSIS — O9989 Other specified diseases and conditions complicating pregnancy, childbirth and the puerperium: Secondary | ICD-10-CM | POA: Insufficient documentation

## 2013-09-04 DIAGNOSIS — Z3490 Encounter for supervision of normal pregnancy, unspecified, unspecified trimester: Secondary | ICD-10-CM

## 2013-09-04 DIAGNOSIS — O26899 Other specified pregnancy related conditions, unspecified trimester: Secondary | ICD-10-CM

## 2013-09-04 DIAGNOSIS — Z6791 Unspecified blood type, Rh negative: Secondary | ICD-10-CM

## 2013-09-04 DIAGNOSIS — R630 Anorexia: Secondary | ICD-10-CM | POA: Insufficient documentation

## 2013-09-04 DIAGNOSIS — B9689 Other specified bacterial agents as the cause of diseases classified elsewhere: Secondary | ICD-10-CM | POA: Insufficient documentation

## 2013-09-04 DIAGNOSIS — A499 Bacterial infection, unspecified: Secondary | ICD-10-CM | POA: Insufficient documentation

## 2013-09-04 DIAGNOSIS — R110 Nausea: Secondary | ICD-10-CM | POA: Insufficient documentation

## 2013-09-04 DIAGNOSIS — N760 Acute vaginitis: Secondary | ICD-10-CM | POA: Insufficient documentation

## 2013-09-04 LAB — BASIC METABOLIC PANEL
BUN: 8 mg/dL (ref 6–23)
CALCIUM: 9 mg/dL (ref 8.4–10.5)
CO2: 23 mEq/L (ref 19–32)
Chloride: 107 mEq/L (ref 96–112)
Creatinine, Ser: 0.71 mg/dL (ref 0.50–1.10)
GFR calc Af Amer: >90 mL/min (ref >90)
Glucose, Bld: 61 mg/dL — ABNORMAL LOW (ref 70–99)
Potassium: 4.2 mEq/L (ref 3.7–5.3)
SODIUM: 141 meq/L (ref 137–147)

## 2013-09-04 LAB — CBC WITH DIFFERENTIAL/PLATELET
BASOS ABS: 0 10*3/uL (ref 0.0–0.1)
Basophils Relative: 0 % (ref 0–1)
EOS PCT: 1 % (ref 0–5)
Eosinophils Absolute: 0.1 10*3/uL (ref 0.0–0.7)
HCT: 34 % — ABNORMAL LOW (ref 36.0–46.0)
Hemoglobin: 10.9 g/dL — ABNORMAL LOW (ref 12.0–15.0)
LYMPHS PCT: 23 % (ref 12–46)
Lymphs Abs: 1.6 10*3/uL (ref 0.7–4.0)
MCH: 26.5 pg (ref 26.0–34.0)
MCHC: 32.1 g/dL (ref 30.0–36.0)
MCV: 82.5 fL (ref 78.0–100.0)
Monocytes Absolute: 0.7 10*3/uL (ref 0.1–1.0)
Monocytes Relative: 10 % (ref 3–12)
NEUTROS PCT: 66 % (ref 43–77)
Neutro Abs: 4.5 10*3/uL (ref 1.7–7.7)
Platelets: 299 10*3/uL (ref 150–400)
RBC: 4.12 MIL/uL (ref 3.87–5.11)
RDW: 13.5 % (ref 11.5–15.5)
WBC: 6.9 10*3/uL (ref 4.0–10.5)

## 2013-09-04 LAB — WET PREP, GENITAL
Trich, Wet Prep: NONE SEEN
Yeast Wet Prep HPF POC: NONE SEEN

## 2013-09-04 LAB — HEPATIC FUNCTION PANEL
ALBUMIN: 3.4 g/dL — AB (ref 3.5–5.2)
ALK PHOS: 48 U/L (ref 39–117)
ALT: 7 U/L (ref 0–35)
AST: 11 U/L (ref 0–37)
Bilirubin, Direct: <0.2 mg/dL (ref 0.0–0.3)
Total Bilirubin: 0.2 mg/dL — ABNORMAL LOW (ref 0.3–1.2)
Total Protein: 6.2 g/dL (ref 6.0–8.3)

## 2013-09-04 LAB — URINALYSIS, ROUTINE W REFLEX MICROSCOPIC
Bilirubin Urine: NEGATIVE
Glucose, UA: NEGATIVE mg/dL
HGB URINE DIPSTICK: NEGATIVE
Ketones, ur: NEGATIVE mg/dL
LEUKOCYTES UA: NEGATIVE
NITRITE: NEGATIVE
Protein, ur: NEGATIVE mg/dL
Specific Gravity, Urine: 1.034 — ABNORMAL HIGH (ref 1.005–1.030)
UROBILINOGEN UA: 0.2 mg/dL (ref 0.0–1.0)
pH: 5.5 (ref 5.0–8.0)

## 2013-09-04 LAB — POC URINE PREG, ED: PREG TEST UR: POSITIVE — AB

## 2013-09-04 LAB — HIV ANTIBODY (ROUTINE TESTING W REFLEX): HIV 1&2 Ab, 4th Generation: NONREACTIVE

## 2013-09-04 LAB — ABO/RH
ABO/RH(D): B NEG
ANTIBODY SCREEN: NEGATIVE

## 2013-09-04 LAB — HCG, QUANTITATIVE, PREGNANCY: HCG, BETA CHAIN, QUANT, S: 46577 m[IU]/mL — AB (ref <5)

## 2013-09-04 LAB — LIPASE, BLOOD: LIPASE: 29 U/L (ref 11–59)

## 2013-09-04 LAB — RPR

## 2013-09-04 MED ORDER — METRONIDAZOLE 500 MG PO TABS: 500.0000 mg | ORAL_TABLET | Freq: Two times a day (BID) | ORAL | Status: DC

## 2013-09-04 NOTE — Discharge Instructions (Signed)
Bacterial Vaginosis Bacterial vaginosis is a vaginal infection that occurs when the normal balance of bacteria in the vagina is disrupted. It results from an overgrowth of certain bacteria. This is the most common vaginal infection in women of childbearing age. Treatment is important to prevent complications, especially in pregnant women, as it can cause a premature delivery. CAUSES  Bacterial vaginosis is caused by an increase in harmful bacteria that are normally present in smaller amounts in the vagina. Several different kinds of bacteria can cause bacterial vaginosis. However, the reason that the condition develops is not fully understood. RISK FACTORS Certain activities or behaviors can put you at an increased risk of developing bacterial vaginosis, including:  Having a new sex partner or multiple sex partners.  Douching.  Using an intrauterine device (IUD) for contraception. Women do not get bacterial vaginosis from toilet seats, bedding, swimming pools, or contact with objects around them. SIGNS AND SYMPTOMS  Some women with bacterial vaginosis have no signs or symptoms. Common symptoms include:  Grey vaginal discharge.  A fishlike odor with discharge, especially after sexual intercourse.  Itching or burning of the vagina and vulva.  Burning or pain with urination. DIAGNOSIS  Your health care provider will take a medical history and examine the vagina for signs of bacterial vaginosis. A sample of vaginal fluid may be taken. Your health care provider will look at this sample under a microscope to check for bacteria and abnormal cells. A vaginal pH test may also be done.  TREATMENT  Bacterial vaginosis may be treated with antibiotic medicines. These may be given in the form of a pill or a vaginal cream. A second round of antibiotics may be prescribed if the condition comes back after treatment.  HOME CARE INSTRUCTIONS   Only take over-the-counter or prescription medicines as  directed by your health care provider.  If antibiotic medicine was prescribed, take it as directed. Make sure you finish it even if you start to feel better.  Do not have sex until treatment is completed.  Tell all sexual partners that you have a vaginal infection. They should see their health care provider and be treated if they have problems, such as a mild rash or itching.  Practice safe sex by using condoms and only having one sex partner. SEEK MEDICAL CARE IF:   Your symptoms are not improving after 3 days of treatment.  You have increased discharge or pain.  You have a fever. MAKE SURE YOU:   Understand these instructions.  Will watch your condition.  Will get help right away if you are not doing well or get worse. FOR MORE INFORMATION  Centers for Disease Control and Prevention, Division of STD Prevention: [employer] American Sexual Health Association (ASHA): www.ashastd.org  Document Released: 04/17/2005 Document Revised: 02/05/2013 Document Reviewed: 11/27/2012 [facility] Patient Information 2014 [place].  Pregnancy - First Trimester During sexual intercourse, millions of sperm go into the vagina. Only 1 sperm will penetrate and fertilize the female egg while it is in the Fallopian tube. One week later, the fertilized egg implants into the wall of the uterus. An embryo begins to develop into a baby. At 6 to 8 weeks, the eyes and face are formed and the heartbeat can be seen on ultrasound. At the end of 12 weeks (first trimester), all the baby's organs are formed. Now that you are pregnant, you will want to do everything you can to have a healthy baby. Two of the most important things are to get good  prenatal care and follow your caregiver's instructions. Prenatal care is all the medical care you receive before the baby's birth. It is given to prevent, find, and treat problems during the pregnancy and childbirth. PRENATAL EXAMS  During prenatal visits, your weight,  blood pressure, and urine are checked. This is done to make sure you are healthy and progressing normally during the pregnancy.  A pregnant woman should gain 25 to 35 pounds during the pregnancy. However, if you are overweight or underweight, your caregiver will advise you regarding your weight.  Your caregiver will ask and answer questions for you.  Blood work, cervical cultures, other necessary tests, and a Pap test are done during your prenatal exams. These tests are done to check on your health and the probable health of your baby. Tests are strongly recommended and done for HIV with your permission. This is the virus that causes AIDS. These tests are done because medicines can be given to help prevent your baby from being born with this infection should you have been infected without knowing it. Blood work is also used to find out your blood type, previous infections, and follow your blood levels (hemoglobin).  Low hemoglobin (anemia) is common during pregnancy. Iron and vitamins are given to help prevent this. Later in the pregnancy, blood tests for diabetes will be done along with any other tests if any problems develop.  You may need other tests to make sure you and the baby are doing well. CHANGES DURING THE FIRST TRIMESTER  Your body goes through many changes during pregnancy. They vary from person to person. Talk to your caregiver about changes you notice and are concerned about. Changes can include:  Your menstrual period stops.  The egg and sperm carry the genes that determine what you look like. Genes from you and your partner are forming a baby. The female genes determine whether the baby is a boy or a girl.  Your body increases in girth and you may feel bloated.  Feeling sick to your stomach (nauseous) and throwing up (vomiting). If the vomiting is uncontrollable, call your caregiver.  Your breasts will begin to enlarge and become tender.  Your nipples may stick out more and  become darker.  The need to urinate more. Painful urination may mean you have a bladder infection.  Tiring easily.  Loss of appetite.  Cravings for certain kinds of food.  At first, you may gain or lose a couple of pounds.  You may have changes in your emotions from day to day (excited to be pregnant or concerned something may go wrong with the pregnancy and baby).  You may have more vivid and strange dreams. HOME CARE INSTRUCTIONS   It is very important to avoid all smoking, alcohol and non-prescribed drugs during your pregnancy. These affect the formation and growth of the baby. Avoid chemicals while pregnant to ensure the delivery of a healthy infant.  Start your prenatal visits by the 12th week of pregnancy. They are usually scheduled monthly at first, then more often in the last 2 months before delivery. Keep your caregiver's appointments. Follow your caregiver's instructions regarding medicine use, blood and lab tests, exercise, and diet.  During pregnancy, you are providing food for you and your baby. Eat regular, well-balanced meals. Choose foods such as meat, fish, milk and other low fat dairy products, vegetables, fruits, and whole-grain breads and cereals. Your caregiver will tell you of the ideal weight gain.  You can help morning sickness by keeping  soda crackers at the bedside. Eat a couple before arising in the morning. You may want to use the crackers without salt on them.  Eating 4 to 5 small meals rather than 3 large meals a day also may help the nausea and vomiting.  Drinking liquids between meals instead of during meals also seems to help nausea and vomiting.  A physical sexual relationship may be continued throughout pregnancy if there are no other problems. Problems may be early (premature) leaking of amniotic fluid from the membranes, vaginal bleeding, or belly (abdominal) pain.  Exercise regularly if there are no restrictions. Check with your caregiver or  physical therapist if you are unsure of the safety of some of your exercises. Greater weight gain will occur in the last 2 trimesters of pregnancy. Exercising will help:  Control your weight.  Keep you in shape.  Prepare you for labor and delivery.  Help you lose your pregnancy weight after you deliver your baby.  Wear a good support or jogging bra for breast tenderness during pregnancy. This may help if worn during sleep too.  Ask when prenatal classes are available. Begin classes when they are offered.  Do not use hot tubs, steam rooms, or saunas.  Wear your seat belt when driving. This protects you and your baby if you are in an accident.  Avoid raw meat, uncooked cheese, cat litter boxes, and soil used by cats throughout the pregnancy. These carry germs that can cause birth defects in the baby.  The first trimester is a good time to visit your dentist for your dental health. Getting your teeth cleaned is okay. Use a softer toothbrush and brush gently during pregnancy.  Ask for help if you have financial, counseling, or nutritional needs during pregnancy. Your caregiver will be able to offer counseling for these needs as well as refer you for other special needs.  Do not take any medicines or herbs unless told by your caregiver.  Inform your caregiver if there is any mental or physical domestic violence.  Make a list of emergency phone numbers of family, friends, hospital, and police and fire departments.  Write down your questions. Take them to your prenatal visit.  Do not douche.  Do not cross your legs.  If you have to stand for long periods of time, rotate you feet or take small steps in a circle.  You may have more vaginal secretions that may require a sanitary pad. Do not use tampons or scented sanitary pads. MEDICINES AND DRUG USE IN PREGNANCY  Take prenatal vitamins as directed. The vitamin should contain 1 milligram of folic acid. Keep all vitamins out of reach of  children. Only a couple vitamins or tablets containing iron may be fatal to a baby or young child when ingested.  Avoid use of all medicines, including herbs, over-the-counter medicines, not prescribed or suggested by your caregiver. Only take over-the-counter or prescription medicines for pain, discomfort, or fever as directed by your caregiver. Do not use aspirin, ibuprofen, or naproxen unless directed by your caregiver.  Let your caregiver also know about herbs you may be using.  Alcohol is related to a number of birth defects. This includes fetal alcohol syndrome. All alcohol, in any form, should be avoided completely. Smoking will cause low birth rate and premature babies.  Street or illegal drugs are very harmful to the baby. They are absolutely forbidden. A baby born to an addicted mother will be addicted at birth. The baby will go through the  same withdrawal an adult does.  Let your caregiver know about any medicines that you have to take and for what reason you take them. SEEK MEDICAL CARE IF:  You have any concerns or worries during your pregnancy. It is better to call with your questions if you feel they cannot wait, rather than worry about them. SEEK IMMEDIATE MEDICAL CARE IF:   An unexplained oral temperature above 102 F (38.9 C) develops, or as your caregiver suggests.  You have leaking of fluid from the vagina (birth canal). If leaking membranes are suspected, take your temperature and inform your caregiver of this when you call.  There is vaginal spotting or bleeding. Notify your caregiver of the amount and how many pads are used.  You develop a bad smelling vaginal discharge with a change in the color.  You continue to feel sick to your stomach (nauseated) and have no relief from remedies suggested. You vomit blood or coffee ground-like materials.  You lose more than 2 pounds of weight in 1 week.  You gain more than 2 pounds of weight in 1 week and you notice swelling of  your face, hands, feet, or legs.  You gain 5 pounds or more in 1 week (even if you do not have swelling of your hands, face, legs, or feet).  You get exposed to [place] measles and have never had them.  You are exposed to fifth disease or chickenpox.  You develop belly (abdominal) pain. Round ligament discomfort is a common non-cancerous (benign) cause of abdominal pain in pregnancy. Your caregiver still must evaluate this.  You develop headache, fever, diarrhea, pain with urination, or shortness of breath.  You fall or are in a car accident or have any kind of trauma.  There is mental or physical violence in your home. Document Released: 04/11/2001 Document Revised: 01/10/2012 Document Reviewed: 10/13/2008 [facility] Patient Information 2014 [place] [employer], [place].  Medicines During Pregnancy During pregnancy, there are medicines that are either safe or unsafe to take. Medicines include prescriptions from your caregiver, over-the-counter medicines, topical creams applied to the skin, and all herbal substances. Medicines are put into either Class A, B, C, or D. Class A and B medicines have been shown to be safe in pregnancy. Class C medicines are also considered to be safe in pregnancy, but these medicines should only be used when necessary. Class D medicines should not be used at all in pregnancy. They can be harmful to a baby.  It is best to take as little medicine as possible while pregnant. However, some medicines are necessary to take for the mother and baby's health. Sometimes, it is more dangerous to stop taking certain medicines than to stay on them. This is often the case for people with long-term (chronic) conditions such as asthma, diabetes, or high blood pressure (hypertension). If you are pregnant and have a chronic illness, call your caregiver right away. Bring a list of your medicines and their doses to your appointments. If you are planning to become pregnant, schedule a doctor's  appointment and discuss your medicines with your caregiver. Lastly, write down the phone number to your pharmacist. They can answer questions regarding a medicine's class and safety. They cannot give advice as to whether you should or should not be on a medicine.  SAFE AND UNSAFE MEDICINES There is a long list of medicines that are considered safe in pregnancy. Below is a shorter list. For specific medicines, ask your caregiver.  AllergyMedicines Loratadine, cetirizine, and chlorpheniramine are safe  to take. Certain nasal steroid sprays are safe. Talk to your caregiver about specific brands that are safe. Analgesics Acetaminophen and acetaminophen with codeine are safe to take. All other nonsteroidal anti-inflammatory drugs (NSAIDS) are not safe. This includes ibuprofen.  Antacids Many over-the-counter antacids are safe to take. Talk to your caregiver about specific brands that are safe. Famotidine, ranitidine, and lansoprazole are safe. Omepresole is considered safe to take in the second trimester. Antibiotic Medicines There are several antibiotics to avoid. These include, but are not limited to, tetracyline, quinolones, and sulfa medications. Talk to your caregiver before taking any antibiotic.  Antihistamines Talk to your caregiver about specific brands that are safe.  Asthma Medicines Most asthma steroid inhalers are safe to take. Talk to your caregiver for specific details. Calcium Calcium supplements are safe to take. Do not take oyster shell calcium.  Cough and Cold Medicines It is safe to take products with guaifenesin or dextromethorphan. Talk to your caregiver about specific brands that are safe. It is not safe to take products that contain aspirin or ibuprofen. Decongestant Medicines Pseudoephedrine-containing products are safe to take in the second and third trimester.  Depression Medicines Talk about these medicines with your caregiver.  Antidiarrheal Medicines It is  safe to take loperamide. Talk to your caregiver about specific brands that are safe. It is not safe to take any antidiarrheal medicine that contains bismuth. Eyedrops Allergy eyedrops should be limited.  Iron It is safe to use certain iron-containing medicines for anemia in pregnancy. They require a prescription.  Antinausea Medicines It is safe to take doxylamine and vitamin B6 as directed. There are other prescription medicines available, if needed.  Sleep aids It is safe to take diphenhydramine and acetaminophen with diphenhydramine.  Steroids Hydrocortisone creams are safe to use as directed. Oral steroids require a prescription. It is not safe to take any hemorrhoid cream with pramoxine or phenylephrine. Stool softener It is safe to take stool softener medicines. Avoid daily or prolonged use of stool softeners. Thyroid Medicine It is important to stay on this thyroid medicine. It needs to be followed by your caregiver.  Vaginal Medicines Your caregiver will prescribe a medicine to you if you have a vaginal infection. Certain antifungal medicines are safe to use if you have a sexually transmitted infection (STI). Talk to your caregiver.  Document Released: 04/17/2005 Document Revised: 07/10/2011 Document Reviewed: 04/18/2011 [facility] Patient Information 2014 [place]Care, [place].

## 2013-09-04 NOTE — ED Provider Notes (Signed)
CSN: [id number]     Arrival date & time 09/04/13  1157 History   First MD Initiated Contact with Patient 09/04/13 1511     Chief Complaint  Patient presents with  . Abdominal Cramping     (Consider location/radiation/quality/duration/timing/severity/associated sxs/prior Treatment) HPI Comments: Patient is 23 year old female who is [redacted] weeks pregnant with her first pregnancy.  She states that over the past 3 days she has been having lower abdominal/suprapubic cramping.  She called her OB's office and they offered to see her but she states that the pain would ease off.  Over the course of the day today she has noted increased pain greater on the left side than right.  She reports nausea but no vomiting which she has had since the beginning of her pregnancy.  She states that today she wiped and noted a "pink tinge" on the toilet paper.  She reports a previous history of chlamydia but no other STD's.  She denies fever, chills, vomiting, upper abdominal pain, vaginal discharge.  Patient is a 23 y.o. female presenting with cramps. The history is provided by the patient. No language interpreter was used.  Abdominal Cramping Pain location:  Suprapubic Pain quality: cramping   Pain radiates to:  Does not radiate Pain severity:  Moderate Onset quality:  Gradual Duration:  3 days Timing:  Intermittent Progression:  Worsening Chronicity:  New Relieved by:  Nothing Worsened by:  Nothing tried Ineffective treatments:  None tried Associated symptoms: anorexia, nausea and vaginal bleeding   Associated symptoms: no vaginal discharge     Past Medical History  Diagnosis Date  . Medical history non-contributory    Past Surgical History  Procedure Laterality Date  . No past surgeries     Family History  Problem Relation Age of Onset  . Hypertension Mother   . Hypertension Father    History  Substance Use Topics  . Smoking status: Never Smoker   . Smokeless tobacco: Never Used  . Alcohol Use:  No   OB History   Grav Para Term Preterm Abortions TAB SAB Ect Mult Living   1    0   0       Review of Systems  Gastrointestinal: Positive for nausea and anorexia.  Genitourinary: Positive for vaginal bleeding. Negative for vaginal discharge.  All other systems reviewed and are negative.     Allergies  Review of patient's allergies indicates no known allergies.  Home Medications   Prior to Admission medications   Not on File   BP 114/72  Pulse 88  Temp(Src) 98.3 F (36.8 C) (Oral)  Resp 18  Ht 5\' 3"  (1.6 m)  Wt 125 lb 11.2 oz (57.017 kg)  BMI 22.27 kg/m2  SpO2 100%  LMP 06/29/2013 Physical Exam  Nursing note and vitals reviewed. Constitutional: She is oriented to person, place, and time. She appears well-developed and well-nourished. No distress.  HENT:  Head: Normocephalic and atraumatic.  Right Ear: External ear normal.  Left Ear: External ear normal.  Nose: Nose normal.  Mouth/Throat: Oropharynx is clear and moist. No oropharyngeal exudate.  Eyes: Conjunctivae are normal. Pupils are equal, round, and reactive to light. No scleral icterus.  Neck: Normal range of motion.  Cardiovascular: Normal rate, regular rhythm and normal heart sounds.   Pulmonary/Chest: Effort normal and breath sounds normal. No respiratory distress. She has no wheezes. She has no rales. She exhibits no tenderness.  Abdominal: Soft. Bowel sounds are normal. She exhibits no distension. There is tenderness.  There is no rebound and no guarding.  Genitourinary: Pelvic exam was performed with patient supine. There is no rash or tenderness on the right labia. There is no rash or tenderness on the left labia. Uterus is not enlarged and not tender. Cervix exhibits discharge. Cervix exhibits no motion tenderness. Right adnexum displays no mass and no tenderness. Left adnexum displays no mass and no tenderness. No tenderness or bleeding around the vagina.  Scant thin white discharge  Musculoskeletal:  Normal range of motion. She exhibits no edema and no tenderness.  Neurological: She is alert and oriented to person, place, and time. She exhibits normal muscle tone. Coordination normal.  Skin: Skin is warm and dry. No rash noted. No erythema. No pallor.  Psychiatric: She has a normal mood and affect. Her behavior is normal. Judgment and thought content normal.    ED Course  Procedures (including critical care time) Labs Review Labs Reviewed  URINALYSIS, ROUTINE W REFLEX MICROSCOPIC - Abnormal; Notable for the following:    APPearance HAZY (*)    Specific Gravity, Urine 1.034 (*)    All other components within normal limits  CBC WITH DIFFERENTIAL - Abnormal; Notable for the following:    Hemoglobin 10.9 (*)    HCT 34.0 (*)    All other components within normal limits  BASIC METABOLIC PANEL - Abnormal; Notable for the following:    Glucose, Bld 61 (*)    All other components within normal limits  HEPATIC FUNCTION PANEL - Abnormal; Notable for the following:    Albumin 3.4 (*)    Total Bilirubin 0.2 (*)    All other components within normal limits  HCG, QUANTITATIVE, PREGNANCY - Abnormal; Notable for the following:    hCG, Beta Chain, Quant, S [id number] (*)    All other components within normal limits  POC URINE PREG, ED - Abnormal; Notable for the following:    Preg Test, Ur POSITIVE (*)    All other components within normal limits  GC/CHLAMYDIA PROBE AMP  WET PREP, GENITAL  LIPASE, BLOOD  RPR  HIV ANTIBODY (ROUTINE TESTING)  ABO/RH    Imaging Review No results found.   EKG Interpretation None     Results for orders placed during the hospital encounter of 09/04/13  WET PREP, GENITAL      Result Value Ref Range   Yeast Wet Prep HPF POC NONE SEEN  NONE SEEN   Trich, Wet Prep NONE SEEN  NONE SEEN   Clue Cells Wet Prep HPF POC FEW (*) NONE SEEN   WBC, Wet Prep HPF POC MODERATE (*) NONE SEEN  URINALYSIS, ROUTINE W REFLEX MICROSCOPIC      Result Value Ref Range   Color,  Urine YELLOW  YELLOW   APPearance HAZY (*) CLEAR   Specific Gravity, Urine 1.034 (*) 1.005 - 1.030   pH 5.5  5.0 - 8.0   Glucose, UA NEGATIVE  NEGATIVE mg/dL   Hgb urine dipstick NEGATIVE  NEGATIVE   Bilirubin Urine NEGATIVE  NEGATIVE   Ketones, ur NEGATIVE  NEGATIVE mg/dL   Protein, ur NEGATIVE  NEGATIVE mg/dL   Urobilinogen, UA 0.2  0.0 - 1.0 mg/dL   Nitrite NEGATIVE  NEGATIVE   Leukocytes, UA NEGATIVE  NEGATIVE  CBC WITH DIFFERENTIAL      Result Value Ref Range   WBC 6.9  4.0 - 10.5 K/uL   RBC 4.12  3.87 - 5.11 MIL/uL   Hemoglobin 10.9 (*) 12.0 - 15.0 g/dL   HCT 16.1 (*) 09.6 -  46.0 %   MCV 82.5  78.0 - 100.0 fL   MCH 26.5  26.0 - 34.0 pg   MCHC 32.1  30.0 - 36.0 g/dL   RDW 14.713.5  82.911.5 - 56.215.5 %   Platelets 299  150 - 400 K/uL   Neutrophils Relative % 66  43 - 77 %   Neutro Abs 4.5  1.7 - 7.7 K/uL   Lymphocytes Relative 23  12 - 46 %   Lymphs Abs 1.6  0.7 - 4.0 K/uL   Monocytes Relative 10  3 - 12 %   Monocytes Absolute 0.7  0.1 - 1.0 K/uL   Eosinophils Relative 1  0 - 5 %   Eosinophils Absolute 0.1  0.0 - 0.7 K/uL   Basophils Relative 0  0 - 1 %   Basophils Absolute 0.0  0.0 - 0.1 K/uL  BASIC METABOLIC PANEL      Result Value Ref Range   Sodium 141  137 - 147 mEq/L   Potassium 4.2  3.7 - 5.3 mEq/L   Chloride 107  96 - 112 mEq/L   CO2 23  19 - 32 mEq/L   Glucose, Bld 61 (*) 70 - 99 mg/dL   BUN 8  6 - 23 mg/dL   Creatinine, Ser 1.300.71  0.50 - 1.10 mg/dL   Calcium 9.0  8.4 - 86.510.5 mg/dL   GFR calc non Af Amer >90  >90 mL/min   GFR calc Af Amer >90  >90 mL/min  HEPATIC FUNCTION PANEL      Result Value Ref Range   Total Protein 6.2  6.0 - 8.3 g/dL   Albumin 3.4 (*) 3.5 - 5.2 g/dL   AST 11  0 - 37 U/L   ALT 7  0 - 35 U/L   Alkaline Phosphatase 48  39 - 117 U/L   Total Bilirubin 0.2 (*) 0.3 - 1.2 mg/dL   Bilirubin, Direct <7.8<0.2  0.0 - 0.3 mg/dL   Indirect Bilirubin NOT CALCULATED  0.3 - 0.9 mg/dL  HCG, QUANTITATIVE, PREGNANCY      Result Value Ref Range   hCG, Beta  Chain, Quant, S [id number] (*) <5 mIU/mL  LIPASE, BLOOD      Result Value Ref Range   Lipase 29  11 - 59 U/L  POC URINE PREG, ED      Result Value Ref Range   Preg Test, Ur POSITIVE (*) NEGATIVE  ABO/RH      Result Value Ref Range   ABO/RH(D) B NEG     Antibody Screen NEG     [place] Ob Comp Less 14 Wks  09/04/2013   CLINICAL DATA:  Nine weeks pregnant. Vaginal bleeding. By LMP the patient is 9 weeks 4 days. EDC is 04/05/2014 by LMP.  EXAM: OBSTETRIC <14 WK US AND TRANSVAGINAL OB US  TECHNIQUE: Both transabdominal and transvaginal ultrasound examinations were performed for complete evaluation of the gestation as well as the maternal uterus, adnexal regions, and pelvic cul-de-sac. Transvaginal technique was performed to assess early pregnancy.  COMPARISON:  None.  FINDINGS: Intrauterine gestational sac: Visualized/normal in shape.  Yolk sac:  Present  Embryo:  Present  Cardiac Activity: Present  Heart Rate:  180 bpm  CRL:   24  mm   9 w 1 d                  US EDC: 04/08/2014  Maternal uterus/adnexae: Right corpus luteum cyst is 2.2 x 1.9 x 1.7 cm. Left ovary has  a normal appearance. No subchorionic hemorrhage identified. No free pelvic fluid.  IMPRESSION: 1. Single living intrauterine embryo. 2. Size correlates well with clinical dating. 3. Clinical EDC of 04/05/2014 is confirmed by this exam.   Electronically Signed   By: Rosalie GumsBeth  Brown M.D.   On: 09/04/2013 16:03   [place] Ob Transvaginal  09/04/2013   CLINICAL DATA:  Nine weeks pregnant. Vaginal bleeding. By LMP the patient is 9 weeks 4 days. EDC is 04/05/2014 by LMP.  EXAM: OBSTETRIC <14 WK US AND TRANSVAGINAL OB US  TECHNIQUE: Both transabdominal and transvaginal ultrasound examinations were performed for complete evaluation of the gestation as well as the maternal uterus, adnexal regions, and pelvic cul-de-sac. Transvaginal technique was performed to assess early pregnancy.  COMPARISON:  None.  FINDINGS: Intrauterine gestational sac: Visualized/normal in shape.   Yolk sac:  Present  Embryo:  Present  Cardiac Activity: Present  Heart Rate:  180 bpm  CRL:   24  mm   9 w 1 d                  US EDC: 04/08/2014  Maternal uterus/adnexae: Right corpus luteum cyst is 2.2 x 1.9 x 1.7 cm. Left ovary has a normal appearance. No subchorionic hemorrhage identified. No free pelvic fluid.  IMPRESSION: 1. Single living intrauterine embryo. 2. Size correlates well with clinical dating. 3. Clinical EDC of 04/05/2014 is confirmed by this exam.   Electronically Signed   By: Rosalie GumsBeth  Brown M.D.   On: 09/04/2013 16:03     MDM  BV IUP Rh negative  Patient here with bilateral lower abdominal pain and scant pink blush to vaginal discharge, no blood noted in vaginal vault and cervical os is closed.  Patient with single living IUP, suspect the pain is related to ligamentous pain.  Patient is Rh negative, and will give a copy of her lab results with her discharge.  She does have clue cells so I will treat for BV - do not suspect cervicitis at this time.  Patient to follow up with OB  Scarlette CalicoFrances C. Marisue HumbleSanford, [place] JerseyPA-C 09/04/13 1853

## 2013-09-04 NOTE — ED Notes (Signed)
Pt at U/S

## 2013-09-04 NOTE — ED Notes (Signed)
Pt reports constant bilateral lower abdominal cramping since yesterday afternoon that is progressively worsening. Pt reports 1 episode of light pink blood. Denies urinary frequency, dysuria. States she is appx [redacted] weeks pregnant. Reports nausea but states "that is normal with my pregnancy." Pt also reports "not eating" x 7 weeks dt lack of appetite.

## 2013-09-05 LAB — GC/CHLAMYDIA PROBE AMP
CT Probe RNA: NEGATIVE
GC Probe RNA: NEGATIVE

## 2013-09-08 NOTE — ED Provider Notes (Signed)
Medical screening examination/treatment/procedure(s) were performed by non-physician practitioner and as supervising physician I was immediately available for consultation/collaboration.   EKG Interpretation None        Shelda JakesScott W. Khadeeja Elden, MD 09/08/13 [phone]

## 2013-09-11 ENCOUNTER — Encounter: Admitting: Obstetrics & Gynecology

## 2013-09-18 ENCOUNTER — Encounter: Admitting: Obstetrics & Gynecology

## 2013-09-20 ENCOUNTER — Emergency Department (HOSPITAL_COMMUNITY)
Admission: EM | Admit: 2013-09-20 | Discharge: 2013-09-20 | Disposition: A | Discharge status: Home / Self Care | Payer: Medicaid Other | Attending: Emergency Medicine | Admitting: Emergency Medicine

## 2013-09-20 ENCOUNTER — Encounter (HOSPITAL_COMMUNITY): Payer: Self-pay | Admitting: Emergency Medicine

## 2013-09-20 ENCOUNTER — Emergency Department (HOSPITAL_COMMUNITY): Payer: Medicaid Other

## 2013-09-20 DIAGNOSIS — Z3490 Encounter for supervision of normal pregnancy, unspecified, unspecified trimester: Secondary | ICD-10-CM

## 2013-09-20 DIAGNOSIS — A499 Bacterial infection, unspecified: Secondary | ICD-10-CM | POA: Insufficient documentation

## 2013-09-20 DIAGNOSIS — N760 Acute vaginitis: Secondary | ICD-10-CM | POA: Insufficient documentation

## 2013-09-20 DIAGNOSIS — B9689 Other specified bacterial agents as the cause of diseases classified elsewhere: Secondary | ICD-10-CM | POA: Insufficient documentation

## 2013-09-20 DIAGNOSIS — O2390 Unspecified genitourinary tract infection in pregnancy, unspecified trimester: Secondary | ICD-10-CM | POA: Insufficient documentation

## 2013-09-20 DIAGNOSIS — Z792 Long term (current) use of antibiotics: Secondary | ICD-10-CM | POA: Insufficient documentation

## 2013-09-20 LAB — I-STAT CHEM 8, ED
BUN: 4 mg/dL — AB (ref 6–23)
CALCIUM ION: 1.24 mmol/L — AB (ref 1.12–1.23)
CREATININE: 0.8 mg/dL (ref 0.50–1.10)
Chloride: 103 mEq/L (ref 96–112)
GLUCOSE: 67 mg/dL — AB (ref 70–99)
HCT: 36 % (ref 36.0–46.0)
Hemoglobin: 12.2 g/dL (ref 12.0–15.0)
Potassium: 3.7 mEq/L (ref 3.7–5.3)
Sodium: 137 mEq/L (ref 137–147)
TCO2: 23 mmol/L (ref 0–100)

## 2013-09-20 LAB — CBC WITH DIFFERENTIAL/PLATELET
Basophils Absolute: 0 10*3/uL (ref 0.0–0.1)
Basophils Relative: 1 % (ref 0–1)
EOS PCT: 1 % (ref 0–5)
Eosinophils Absolute: 0.1 10*3/uL (ref 0.0–0.7)
HEMATOCRIT: 32.3 % — AB (ref 36.0–46.0)
Hemoglobin: 10.5 g/dL — ABNORMAL LOW (ref 12.0–15.0)
LYMPHS ABS: 1.5 10*3/uL (ref 0.7–4.0)
LYMPHS PCT: 27 % (ref 12–46)
MCH: 26.3 pg (ref 26.0–34.0)
MCHC: 32.5 g/dL (ref 30.0–36.0)
MCV: 80.8 fL (ref 78.0–100.0)
Monocytes Absolute: 0.4 10*3/uL (ref 0.1–1.0)
Monocytes Relative: 6 % (ref 3–12)
Neutro Abs: 3.7 10*3/uL (ref 1.7–7.7)
Neutrophils Relative %: 65 % (ref 43–77)
Platelets: 301 10*3/uL (ref 150–400)
RBC: 4 MIL/uL (ref 3.87–5.11)
RDW: 13 % (ref 11.5–15.5)
WBC: 5.6 10*3/uL (ref 4.0–10.5)

## 2013-09-20 LAB — WET PREP, GENITAL
TRICH WET PREP: NONE SEEN
Yeast Wet Prep HPF POC: NONE SEEN

## 2013-09-20 LAB — HCG, QUANTITATIVE, PREGNANCY: hCG, Beta Chain, Quant, S: 34289 m[IU]/mL — ABNORMAL HIGH (ref <5)

## 2013-09-20 MED ORDER — METRONIDAZOLE 1 % EX GEL: Freq: Every day | CUTANEOUS | Status: DC

## 2013-09-20 NOTE — Discharge Instructions (Signed)
Your ultrasound is normal. Apply gel as prescribed for bacterial vaginosis. Follow up with your OB/GYN doctor.   Bacterial Vaginosis Bacterial vaginosis is a vaginal infection that occurs when the normal balance of bacteria in the vagina is disrupted. It results from an overgrowth of certain bacteria. This is the most common vaginal infection in women of childbearing age. Treatment is important to prevent complications, especially in pregnant women, as it can cause a premature delivery. CAUSES  Bacterial vaginosis is caused by an increase in harmful bacteria that are normally present in smaller amounts in the vagina. Several different kinds of bacteria can cause bacterial vaginosis. However, the reason that the condition develops is not fully understood. RISK FACTORS Certain activities or behaviors can put you at an increased risk of developing bacterial vaginosis, including:  Having a new sex partner or multiple sex partners.  Douching.  Using an intrauterine device (IUD) for contraception. Women do not get bacterial vaginosis from toilet seats, bedding, swimming pools, or contact with objects around them. SIGNS AND SYMPTOMS  Some women with bacterial vaginosis have no signs or symptoms. Common symptoms include:  Grey vaginal discharge.  A fishlike odor with discharge, especially after sexual intercourse.  Itching or burning of the vagina and vulva.  Burning or pain with urination. DIAGNOSIS  Your health care provider will take a medical history and examine the vagina for signs of bacterial vaginosis. A sample of vaginal fluid may be taken. Your health care provider will look at this sample under a microscope to check for bacteria and abnormal cells. A vaginal pH test may also be done.  TREATMENT  Bacterial vaginosis may be treated with antibiotic medicines. These may be given in the form of a pill or a vaginal cream. A second round of antibiotics may be prescribed if the condition comes  back after treatment.  HOME CARE INSTRUCTIONS   Only take over-the-counter or prescription medicines as directed by your health care provider.  If antibiotic medicine was prescribed, take it as directed. Make sure you finish it even if you start to feel better.  Do not have sex until treatment is completed.  Tell all sexual partners that you have a vaginal infection. They should see their health care provider and be treated if they have problems, such as a mild rash or itching.  Practice safe sex by using condoms and only having one sex partner. SEEK MEDICAL CARE IF:   Your symptoms are not improving after 3 days of treatment.  You have increased discharge or pain.  You have a fever. MAKE SURE YOU:   Understand these instructions.  Will watch your condition.  Will get help right away if you are not doing well or get worse. FOR MORE INFORMATION  Centers for Disease Control and Prevention, Division of STD Prevention: [employer] American Sexual Health Association (ASHA): www.ashastd.org  Document Released: 04/17/2005 Document Revised: 02/05/2013 Document Reviewed: 11/27/2012 [facility] Patient Information 2014 [place], [place].

## 2013-09-20 NOTE — ED Notes (Signed)
Patient returned from Ultrasound. 

## 2013-09-20 NOTE — ED Provider Notes (Signed)
Medical screening examination/treatment/procedure(s) were performed by non-physician practitioner and as supervising physician I was immediately available for consultation/collaboration.   EKG Interpretation None       Elleanor Guyett, MD 09/20/13 1931 

## 2013-09-20 NOTE — ED Notes (Signed)
Pt was seen here on 5/7 for pregnancy with light vaginal bleeding. All tests came back normal, pt then having increase in vaginal bleeding and cramping this am. Denies heavy bleeding or large clots.

## 2013-09-20 NOTE — ED Notes (Signed)
Pelvic cart @ bedside ready. Pt undressed waist down.

## 2013-09-20 NOTE — ED Notes (Signed)
Patient transported to Ultrasound 

## 2013-09-20 NOTE — ED Provider Notes (Signed)
CSN: [id number]     Arrival date & time 09/20/13  1420 History   First MD Initiated Contact with Patient 09/20/13 1429     Chief Complaint  Patient presents with  . Vaginal Bleeding     (Consider location/radiation/quality/duration/timing/severity/associated sxs/prior Treatment) HPI [name] Young is a 23 y.o. female who presents to ED with complaint of vaginal bleeding. Pt states she is [redacted] wks pregnant, G1P0, with confirmed IUP. States noted some cramping yesterday in her lower abdomen. This morning states she saw bright red blood in her underwear. States bleeding is slowing down. She denies any current pain. States had similar episode 2 wks ago, only lighter bleeding at that time, was seen here and had US done which was normal. Denies any fever, chills, hematuria, problems with bowel movement. No other complaints.   Past Medical History  Diagnosis Date  . Medical history non-contributory    Past Surgical History  Procedure Laterality Date  . No past surgeries     Family History  Problem Relation Age of Onset  . Hypertension Mother   . Hypertension Father    History  Substance Use Topics  . Smoking status: Never Smoker   . Smokeless tobacco: Never Used  . Alcohol Use: No   OB History   Grav Para Term Preterm Abortions TAB SAB Ect Mult Living   1    0   0       Review of Systems  Constitutional: Negative for fever and chills.  Respiratory: Negative for cough, chest tightness and shortness of breath.   Cardiovascular: Negative for chest pain, palpitations and leg swelling.  Gastrointestinal: Negative for nausea, vomiting, abdominal pain and diarrhea.  Genitourinary: Positive for vaginal bleeding. Negative for dysuria, flank pain, vaginal discharge, vaginal pain and pelvic pain.  Musculoskeletal: Negative for arthralgias and myalgias.  Skin: Negative for rash.  Neurological: Negative for dizziness, weakness and headaches.  All other systems reviewed and are  negative.     Allergies  Review of patient's allergies indicates no known allergies.  Home Medications   Prior to Admission medications   Medication Sig Start Date End Date Taking? Authorizing Provider  metroNIDAZOLE (FLAGYL) 500 MG tablet Take 1 tablet (500 mg total) by mouth 2 (two) times daily. 09/04/13   Izola Price. Sanford, PA-C   BP 114/63  Pulse 78  Temp(Src) 98.8 F (37.1 C) (Oral)  Resp 18  Ht 5\' 3"  (1.6 m)  Wt 125 lb (56.7 kg)  BMI 22.15 kg/m2  SpO2 100%  LMP 06/29/2013 Physical Exam  Nursing note and vitals reviewed. Constitutional: She appears well-developed and well-nourished. No distress.  HENT:  Head: Normocephalic.  Eyes: Conjunctivae are normal.  Neck: Neck supple.  Cardiovascular: Normal rate, regular rhythm and normal heart sounds.   Pulmonary/Chest: Effort normal and breath sounds normal. No respiratory distress. She has no wheezes. She has no rales.  Abdominal: Soft. Bowel sounds are normal. She exhibits no distension. There is no tenderness. There is no rebound and no guarding.  Genitourinary:  Normal external genitalia. White thin vaginal discharge. No CMT, no uterine or adnexal tenderness.   Musculoskeletal: She exhibits no edema.  Neurological: She is alert.  Skin: Skin is warm and dry.  Psychiatric: She has a normal mood and affect. Her behavior is normal.    ED Course  Procedures (including critical care time) Labs Review Labs Reviewed  WET PREP, GENITAL - Abnormal; Notable for the following:    Clue Cells Wet Prep HPF POC MODERATE (*)  WBC, Wet Prep HPF POC FEW (*)    All other components within normal limits  CBC WITH DIFFERENTIAL - Abnormal; Notable for the following:    Hemoglobin 10.5 (*)    HCT 32.3 (*)    All other components within normal limits  HCG, QUANTITATIVE, PREGNANCY - Abnormal; Notable for the following:    hCG, Beta Chain, Quant, S [id number] (*)    All other components within normal limits  I-STAT CHEM 8, ED - Abnormal;  Notable for the following:    BUN 4 (*)    Glucose, Bld 67 (*)    Calcium, Ion 1.24 (*)    All other components within normal limits  GC/CHLAMYDIA PROBE AMP    Imaging Review US Ob Comp Less 14 Wks  09/20/2013   CLINICAL DATA:  Vaginal bleeding. Patient should be 11 weeks 6 days by LMP.  EXAM: OBSTETRIC <14 WK ULTRASOUND  TECHNIQUE: Transabdominal ultrasound was performed for evaluation of the gestation as well as the maternal uterus and adnexal regions.  COMPARISON:  Prior ultrasound 09/04/2013.  FINDINGS: Intrauterine gestational sac: Present  Yolk sac:  Present  Embryo:  Present  Cardiac Activity: Present  Heart Rate: 149 bpm  CRL:   51  mm   11 w 6 d                  [place] EDC: 04/05/2014  Maternal uterus/adnexae: Right ovary measures 4.3 x 2.7 x 2.9 cm. It contains a 1.9 cm cyst. The left ovary is not well visualized. No free fluid in the pelvis.  IMPRESSION: Single live intrauterine gestation 11 weeks 6 days by LMP.   Electronically Signed   By: Annia Belt M.D.   On: 09/20/2013 17:10   US Ob Transvaginal  09/20/2013   CLINICAL DATA:  Vaginal bleeding. Patient should be 11 weeks 6 days by LMP.  EXAM: OBSTETRIC <14 WK ULTRASOUND  TECHNIQUE: Transabdominal ultrasound was performed for evaluation of the gestation as well as the maternal uterus and adnexal regions.  COMPARISON:  Prior ultrasound 09/04/2013.  FINDINGS: Intrauterine gestational sac: Present  Yolk sac:  Present  Embryo:  Present  Cardiac Activity: Present  Heart Rate: 149 bpm  CRL:   51  mm   11 w 6 d                  [place] EDC: 04/05/2014  Maternal uterus/adnexae: Right ovary measures 4.3 x 2.7 x 2.9 cm. It contains a 1.9 cm cyst. The left ovary is not well visualized. No free fluid in the pelvis.  IMPRESSION: Single live intrauterine gestation 11 weeks 6 days by LMP.   Electronically Signed   By: Annia Belt M.D.   On: 09/20/2013 17:10     EKG Interpretation None      MDM   Final diagnoses:  Intrauterine pregnancy  BV (bacterial  vaginosis)    Pt with vaginal bleeding, [redacted] wks gestation. No abdominal pain at this time. No bleeding at this time on pelvic exam. Pt very worried, crying. Will get labs, [place]    5:17 PM [place] normal, showing live intrauterine gestation. Pt reassured. Metragel for BV. Will d/c home with pelvic rest and follow up with OB/GYN.    Filed Vitals:   09/20/13 1514 09/20/13 1600 09/20/13 1709 09/20/13 1730  BP: 114/63 112/64 115/83 112/82  Pulse: 78 62  96  Temp:      TempSrc:      Resp: 18 20 16 22   Height:  Weight:      SpO2: 100% 100% 100% 100%       Lottie Musselatyana A Alsha Meland, PA-C 09/20/13 1924

## 2013-09-22 LAB — GC/CHLAMYDIA PROBE AMP
CT Probe RNA: NEGATIVE
GC Probe RNA: NEGATIVE

## 2013-11-14 LAB — OB RESULTS CONSOLE GC/CHLAMYDIA
CHLAMYDIA, DNA PROBE: POSITIVE
Gonorrhea: NEGATIVE

## 2013-11-28 ENCOUNTER — Emergency Department (HOSPITAL_COMMUNITY)
Admission: EM | Admit: 2013-11-28 | Discharge: 2013-11-28 | Disposition: A | Discharge status: Home / Self Care | Payer: Medicaid Other | Attending: Emergency Medicine | Admitting: Emergency Medicine

## 2013-11-28 ENCOUNTER — Encounter (HOSPITAL_COMMUNITY): Payer: Self-pay | Admitting: Emergency Medicine

## 2013-11-28 DIAGNOSIS — B9689 Other specified bacterial agents as the cause of diseases classified elsewhere: Secondary | ICD-10-CM | POA: Insufficient documentation

## 2013-11-28 DIAGNOSIS — N949 Unspecified condition associated with female genital organs and menstrual cycle: Secondary | ICD-10-CM | POA: Insufficient documentation

## 2013-11-28 DIAGNOSIS — N760 Acute vaginitis: Secondary | ICD-10-CM | POA: Diagnosis not present

## 2013-11-28 DIAGNOSIS — N72 Inflammatory disease of cervix uteri: Secondary | ICD-10-CM | POA: Diagnosis not present

## 2013-11-28 DIAGNOSIS — A499 Bacterial infection, unspecified: Secondary | ICD-10-CM | POA: Insufficient documentation

## 2013-11-28 DIAGNOSIS — O9989 Other specified diseases and conditions complicating pregnancy, childbirth and the puerperium: Secondary | ICD-10-CM | POA: Insufficient documentation

## 2013-11-28 DIAGNOSIS — R102 Pelvic and perineal pain: Secondary | ICD-10-CM

## 2013-11-28 DIAGNOSIS — Z79899 Other long term (current) drug therapy: Secondary | ICD-10-CM | POA: Insufficient documentation

## 2013-11-28 DIAGNOSIS — O2390 Unspecified genitourinary tract infection in pregnancy, unspecified trimester: Secondary | ICD-10-CM | POA: Diagnosis not present

## 2013-11-28 DIAGNOSIS — Z3490 Encounter for supervision of normal pregnancy, unspecified, unspecified trimester: Secondary | ICD-10-CM

## 2013-11-28 LAB — COMPREHENSIVE METABOLIC PANEL
ALBUMIN: 2.9 g/dL — AB (ref 3.5–5.2)
ALT: 12 U/L (ref 0–35)
AST: 17 U/L (ref 0–37)
Alkaline Phosphatase: 47 U/L (ref 39–117)
Anion gap: 11 (ref 5–15)
BUN: 5 mg/dL — ABNORMAL LOW (ref 6–23)
CALCIUM: 8.7 mg/dL (ref 8.4–10.5)
CO2: 21 mEq/L (ref 19–32)
CREATININE: 0.59 mg/dL (ref 0.50–1.10)
Chloride: 105 mEq/L (ref 96–112)
GFR calc Af Amer: >90 mL/min (ref >90)
GFR calc non Af Amer: >90 mL/min (ref >90)
Glucose, Bld: 84 mg/dL (ref 70–99)
Potassium: 3.7 mEq/L (ref 3.7–5.3)
Sodium: 137 mEq/L (ref 137–147)
Total Bilirubin: <0.2 mg/dL — ABNORMAL LOW (ref 0.3–1.2)
Total Protein: 5.9 g/dL — ABNORMAL LOW (ref 6.0–8.3)

## 2013-11-28 LAB — CBC WITH DIFFERENTIAL/PLATELET
BASOS PCT: 0 % (ref 0–1)
Basophils Absolute: 0 10*3/uL (ref 0.0–0.1)
Eosinophils Absolute: 0.1 10*3/uL (ref 0.0–0.7)
Eosinophils Relative: 2 % (ref 0–5)
HEMATOCRIT: 31.4 % — AB (ref 36.0–46.0)
Hemoglobin: 10.2 g/dL — ABNORMAL LOW (ref 12.0–15.0)
Lymphocytes Relative: 19 % (ref 12–46)
Lymphs Abs: 1.2 10*3/uL (ref 0.7–4.0)
MCH: 27.9 pg (ref 26.0–34.0)
MCHC: 32.5 g/dL (ref 30.0–36.0)
MCV: 85.8 fL (ref 78.0–100.0)
MONO ABS: 0.3 10*3/uL (ref 0.1–1.0)
Monocytes Relative: 5 % (ref 3–12)
Neutro Abs: 4.7 10*3/uL (ref 1.7–7.7)
Neutrophils Relative %: 74 % (ref 43–77)
Platelets: 238 10*3/uL (ref 150–400)
RBC: 3.66 MIL/uL — ABNORMAL LOW (ref 3.87–5.11)
RDW: 14 % (ref 11.5–15.5)
WBC: 6.4 10*3/uL (ref 4.0–10.5)

## 2013-11-28 LAB — URINALYSIS, ROUTINE W REFLEX MICROSCOPIC
Bilirubin Urine: NEGATIVE
Glucose, UA: NEGATIVE mg/dL
Hgb urine dipstick: NEGATIVE
Ketones, ur: NEGATIVE mg/dL
Leukocytes, UA: NEGATIVE
Nitrite: NEGATIVE
PROTEIN: NEGATIVE mg/dL
Specific Gravity, Urine: 1.024 (ref 1.005–1.030)
UROBILINOGEN UA: 1 mg/dL (ref 0.0–1.0)
pH: 7.5 (ref 5.0–8.0)

## 2013-11-28 LAB — PREGNANCY, URINE: PREG TEST UR: POSITIVE — AB

## 2013-11-28 LAB — WET PREP, GENITAL
Trich, Wet Prep: NONE SEEN
Yeast Wet Prep HPF POC: NONE SEEN

## 2013-11-28 LAB — HIV ANTIBODY (ROUTINE TESTING W REFLEX): HIV 1&2 Ab, 4th Generation: NONREACTIVE

## 2013-11-28 LAB — RPR

## 2013-11-28 MED ORDER — CLINDAMYCIN HCL 150 MG PO CAPS: 300.0000 mg | ORAL_CAPSULE | Freq: Two times a day (BID) | ORAL | Status: AC

## 2013-11-28 MED ORDER — DEXTROSE 5 % IV SOLN
1.0000 g | Freq: Once | INTRAVENOUS | Status: AC
  Administered 2013-11-28: 1 g via INTRAVENOUS
  Filled 2013-11-28: qty 10

## 2013-11-28 MED ORDER — SODIUM CHLORIDE 0.9 % IV BOLUS (SEPSIS)
1000.0000 mL | Freq: Once | INTRAVENOUS | Status: AC
  Administered 2013-11-28: 1000 mL via INTRAVENOUS

## 2013-11-28 MED ORDER — AZITHROMYCIN 250 MG PO TABS
1000.0000 mg | ORAL_TABLET | Freq: Once | ORAL | Status: AC
  Administered 2013-11-28: 1000 mg via ORAL
  Filled 2013-11-28: qty 4

## 2013-11-28 NOTE — Progress Notes (Signed)
Called to see pt, G1P0 [id number] gestation [facility] C/O low abd cramping on and off with some "pressure" at times. Ext monitor applied, baby very active. FH 150 no contractions noted on ext monitor. No report ov vag bleeding but some white discharge at times.

## 2013-11-28 NOTE — ED Notes (Signed)
EDP and RROB RN at the bedside. TOCO monitor at the bedside. No distress noted.

## 2013-11-28 NOTE — ED Notes (Signed)
Pt presents with generalized abd cramping and abnormal white vaginal discharge x2 days. Pt denies abnormal vaginal bleeding. Pt is 5 months pregnant. Pt LNMP 06/2013

## 2013-11-28 NOTE — Discharge Instructions (Signed)
Abdominal Pain During Pregnancy °Belly (abdominal) pain is common during pregnancy. Most of the time, it is not a serious problem. Other times, it can be a sign that something is wrong with the pregnancy. Always tell your doctor if you have belly pain. °HOME CARE °Monitor your belly pain for any changes. The following actions may help you feel better: °· Do not have sex (intercourse) or put anything in your vagina until you feel better. °· Rest until your pain stops. °· Drink clear fluids if you feel sick to your stomach (nauseous). Do not eat solid food until you feel better. °· Only take medicine as told by your doctor. °· Keep all doctor visits as told. °GET HELP RIGHT AWAY IF:  °· You are bleeding, leaking fluid, or pieces of tissue come out of your vagina. °· You have more pain or cramping. °· You keep throwing up (vomiting). °· You have pain when you pee (urinate) or have blood in your pee. °· You have a fever. °· You do not feel your baby moving as much. °· You feel very weak or feel like passing out. °· You have trouble breathing, with or without belly pain. °· You have a very bad headache and belly pain. °· You have fluid leaking from your vagina and belly pain. °· You keep having watery poop (diarrhea). °· Your belly pain does not go away after resting, or the pain gets worse. °MAKE SURE YOU:  °· Understand these instructions. °· Will watch your condition. °· Will get help right away if you are not doing well or get worse. °Document Released: 04/05/2009 Document Revised: 12/18/2012 Document Reviewed: 11/14/2012 °[employer]® Patient Information ©2015 [employer]. This information is not intended to replace advice given to you by your health care provider. Make sure you discuss any questions you have with your health care provider. ° °

## 2013-11-28 NOTE — ED Provider Notes (Signed)
CSN: [id number]     Arrival date & time 11/28/13  1708 History   First MD Initiated Contact with Patient 11/28/13 1735     Chief Complaint  Patient presents with  . Abdominal Pain     (Consider location/radiation/quality/duration/timing/severity/associated sxs/prior Treatment) Patient is a 23 y.o. female presenting with abdominal pain.  Abdominal Pain  This is a 23 year old G2 P0 22 week 5 day gestation presents complaining of one to 2 days of crampy lower abdominal pain. Is waxing and waning in nature. It is in the suprapubic area and she states it feels like menstrual cramping. She has not noted any vaginal bleeding. She has noted some whitish discharge. She denies any history of sexual transmitted diseases or pelvic infections. She was seen here previously at the time that her pregnancy was patent had an ultrasound done in May which is what her dates are obtained from. She is receiving prenatal care at [facility].  She has had some increased frequency of urination but has not had any pain. She denies any fever or chills the Past Medical History  Diagnosis Date  . Medical history non-contributory    Past Surgical History  Procedure Laterality Date  . No past surgeries     Family History  Problem Relation Age of Onset  . Hypertension Mother   . Hypertension Father    History  Substance Use Topics  . Smoking status: Never Smoker   . Smokeless tobacco: Never Used  . Alcohol Use: No   OB History   Grav Para Term Preterm Abortions TAB SAB Ect Mult Living   1    0   0       Review of Systems  Gastrointestinal: Positive for abdominal pain.  All other systems reviewed and are negative.     Allergies  Review of patient's allergies indicates no known allergies.  Home Medications   Prior to Admission medications   Medication Sig Start Date End Date Taking? Authorizing Provider  Prenatal Vit-Fe Fumarate-FA (PRENATAL PO) Take 1 tablet by mouth daily.   Yes Historical Provider, MD    BP 134/89  Pulse 82  Temp(Src) 98.1 F (36.7 C) (Oral)  Resp 18  Wt 137 lb 1 oz (62.171 kg)  SpO2 99%  LMP 06/29/2013 Physical Exam  Nursing note and vitals reviewed. Constitutional: She appears well-developed and well-nourished.  HENT:  Head: Normocephalic and atraumatic.  Right Ear: External ear normal.  Left Ear: External ear normal.  Mouth/Throat: Oropharynx is clear and moist.  Eyes: Conjunctivae and EOM are normal. Pupils are equal, round, and reactive to light.  Neck: Normal range of motion. Neck supple.  Cardiovascular: Normal rate, regular rhythm, normal heart sounds and intact distal pulses.   Pulmonary/Chest: Effort normal and breath sounds normal.  Abdominal: Soft. Bowel sounds are normal.  Genitourinary: Uterus is enlarged. Cervix exhibits discharge. Right adnexum displays no mass and no tenderness. Left adnexum displays no mass and no tenderness. Vaginal discharge found.    ED Course  Procedures (including critical care time) Labs Review Labs Reviewed  WET PREP, GENITAL - Abnormal; Notable for the following:    Clue Cells Wet Prep HPF POC MODERATE (*)    WBC, Wet Prep HPF POC MANY (*)    All other components within normal limits  URINALYSIS, ROUTINE W REFLEX MICROSCOPIC - Abnormal; Notable for the following:    APPearance HAZY (*)    All other components within normal limits  PREGNANCY, URINE - Abnormal; Notable for the following:  Preg Test, Ur POSITIVE (*)    All other components within normal limits  CBC WITH DIFFERENTIAL - Abnormal; Notable for the following:    RBC 3.66 (*)    Hemoglobin 10.2 (*)    HCT 31.4 (*)    All other components within normal limits  COMPREHENSIVE METABOLIC PANEL - Abnormal; Notable for the following:    BUN 5 (*)    Total Protein 5.9 (*)    Albumin 2.9 (*)    Total Bilirubin <0.2 (*)    All other components within normal limits  GC/CHLAMYDIA PROBE AMP  RPR  HIV ANTIBODY (ROUTINE TESTING)    Imaging Review No  results found.   EKG Interpretation None      MDM   Final diagnoses:  Pregnancy  Pelvic cramping  BV (bacterial vaginosis)  Cervicitis    MAU nurse saw and evaluated patient on fetal monitor and consulted with the patient's obstetrician. No contractions were seen and fetal heart rate was as expected per RN report.  Patient with vaginal discharge and some cervical irritation noted GC and Chlamydia sent and patient treated with Rocephin and Zithromax. She is given IV fluids 1 L. She's noted to have clue cells on her wet prep and will be treated for bacterial vaginosis.   Filed Vitals:   11/28/13 1830 11/28/13 1845 11/28/13 1915 11/28/13 1945  BP: 125/77 134/89 136/94 109/66  Pulse: 86 82 77 75  Temp:      TempSrc:      Resp:      Weight:      SpO2: 100% 99% 98% 100%    Patient with several elevated bp- but normal at dishcarge- advised regarding follow up.   Hilario Quarryanielle S Adeyemi Hamad, MD 11/28/13 2001

## 2013-11-28 NOTE — Progress Notes (Signed)
Notified Dr Gaynell FaceMarshall of pt's complaints, status, and treatment plan. FH remains Cat 1, no vag discharge, no uterine activity noted on monitor or palpated. Baby remains very active. Pt cleared obstetrically. Reviewed signs and symptoms of PTL and ROM. [facility]. Instructed to keep regular appointments with OB office.

## 2013-11-29 LAB — GC/CHLAMYDIA PROBE AMP
CT Probe RNA: POSITIVE — AB
GC PROBE AMP APTIMA: NEGATIVE

## 2013-11-30 ENCOUNTER — Telehealth (HOSPITAL_BASED_OUTPATIENT_CLINIC_OR_DEPARTMENT_OTHER): Payer: Self-pay | Admitting: Emergency Medicine

## 2013-11-30 NOTE — Telephone Encounter (Signed)
+  Chlamydia. Patient treated with Rocephin and Zithromax. [employer] faxed. 

## 2013-12-07 ENCOUNTER — Encounter (HOSPITAL_COMMUNITY): Payer: Self-pay | Admitting: *Deleted

## 2013-12-07 ENCOUNTER — Inpatient Hospital Stay (HOSPITAL_COMMUNITY)
Admission: AD | Admit: 2013-12-07 | Discharge: 2013-12-07 | Disposition: A | Discharge status: Home / Self Care | Payer: Medicaid Other | Source: Ambulatory Visit | Attending: Obstetrics & Gynecology | Admitting: Obstetrics & Gynecology

## 2013-12-07 DIAGNOSIS — E559 Vitamin D deficiency, unspecified: Secondary | ICD-10-CM | POA: Diagnosis not present

## 2013-12-07 DIAGNOSIS — R109 Unspecified abdominal pain: Secondary | ICD-10-CM | POA: Insufficient documentation

## 2013-12-07 DIAGNOSIS — A5619 Other chlamydial genitourinary infection: Secondary | ICD-10-CM | POA: Insufficient documentation

## 2013-12-07 DIAGNOSIS — N949 Unspecified condition associated with female genital organs and menstrual cycle: Secondary | ICD-10-CM | POA: Insufficient documentation

## 2013-12-07 DIAGNOSIS — A749 Chlamydial infection, unspecified: Secondary | ICD-10-CM

## 2013-12-07 DIAGNOSIS — N739 Female pelvic inflammatory disease, unspecified: Secondary | ICD-10-CM | POA: Insufficient documentation

## 2013-12-07 DIAGNOSIS — O98319 Other infections with a predominantly sexual mode of transmission complicating pregnancy, unspecified trimester: Secondary | ICD-10-CM | POA: Insufficient documentation

## 2013-12-07 DIAGNOSIS — Z3402 Encounter for supervision of normal first pregnancy, second trimester: Secondary | ICD-10-CM

## 2013-12-07 DIAGNOSIS — O98812 Other maternal infectious and parasitic diseases complicating pregnancy, second trimester: Secondary | ICD-10-CM

## 2013-12-07 LAB — URINALYSIS, ROUTINE W REFLEX MICROSCOPIC
Bilirubin Urine: NEGATIVE
Glucose, UA: NEGATIVE mg/dL
Ketones, ur: 15 mg/dL — AB
Leukocytes, UA: NEGATIVE
Nitrite: NEGATIVE
PROTEIN: NEGATIVE mg/dL
Specific Gravity, Urine: 1.02 (ref 1.005–1.030)
Urobilinogen, UA: 0.2 mg/dL (ref 0.0–1.0)
pH: 6 (ref 5.0–8.0)

## 2013-12-07 LAB — URINE MICROSCOPIC-ADD ON

## 2013-12-07 MED ORDER — AZITHROMYCIN 250 MG PO TABS
1000.0000 mg | ORAL_TABLET | Freq: Once | ORAL | Status: AC
  Administered 2013-12-07: 1000 mg via ORAL
  Filled 2013-12-07: qty 4

## 2013-12-07 NOTE — MAU Note (Signed)
States hasn't had an ultrasound "in a while" & wants one today to check on baby b/c she has been intermittent sharp abdominal pain. Denies LOF or vaginal bleeding.

## 2013-12-07 NOTE — MAU Provider Note (Signed)
  History     CSN: [id number]  Arrival date and time: 12/07/13 2055   First Provider Initiated Contact with Patient 12/07/13 2140      Chief Complaint  Patient presents with  . Abdominal Pain   HPI  [name] Young is a ,23 y.o. G1P0000 at 2636w0d who presents today with sharp lower abdominal pain. She had this pain last week, and [facility] She was treated there with Rocephin, Azithromycin, and sent home with flagyl for bacterial vaginosis. Her results from the ED show a +Chlaymdia. The patient states that she has not had a phone call about [facility]. She states that she was told that she was having "growing pains". She states that she has continued to have the pain. She states that she is would like an ultrasound today because she was hoping to find out the gender of the baby. [facility], and doesn't have one until the end of this month. She denies any bleeding or LOF. She has been feeling fetal movement.   Patient states that she did not know about the +chlamydia, and has been having unprotected intercourse with the same partner since having treatment one week ago. He has not had treatment as he did not know.   Past Medical History  Diagnosis Date  . Medical history non-contributory     Past Surgical History  Procedure Laterality Date  . No past surgeries      Family History  Problem Relation Age of Onset  . Hypertension Mother   . Hypertension Father     History  Substance Use Topics  . Smoking status: Never Smoker   . Smokeless tobacco: Never Used  . Alcohol Use: No    Allergies: No Known Allergies  Prescriptions prior to admission  Medication Sig Dispense Refill  . Prenatal Vit-Fe Fumarate-FA (PRENATAL MULTIVITAMIN) TABS tablet Take 1 tablet by mouth daily at 12 noon.        ROS Physical Exam   Blood pressure 149/89, pulse 80, temperature 99.7 F (37.6 C), temperature source Oral, resp. rate 16, height 5'  3" (1.6 m), weight 60.51 kg (133 lb 6.4 oz), last menstrual period 06/29/2013, SpO2 100.00%.  Physical Exam  Nursing note and vitals reviewed. Constitutional: She is oriented to person, place, and time. She appears well-developed and well-nourished. No distress.  Cardiovascular: Normal rate.   Respiratory: Effort normal.  GI: Soft. There is no tenderness. There is no rebound.  Genitourinary:   Cervix: closed/thick/high   Neurological: She is alert and oriented to person, place, and time.  Skin: Skin is warm and dry.  Psychiatric: She has a normal mood and affect.   FHT: 145, moderate with 10x10 accels, no decels, appropriate for GA Toco: No UCs  MAU Course  Procedures   Assessment and Plan   1. Vitamin D deficiency   2. Encounter for supervision of normal first pregnancy in second trimester   3. Round ligament pain   4. Chlamydia infection affecting pregnancy in second trimester, antepartum    Treated for chlamydia again today due to likely treatment failure Discussed importance of partner treatment FU with the office as planned Return to MAU as needed  Follow-up Information   [facility]. (As scheduled)    Contact information:   [address] [place] [id number] [phone]       Tawnya CrookHogan, Heather Donovan 12/07/2013, 9:46 PM

## 2013-12-07 NOTE — Telephone Encounter (Signed)
Unable to contact patient via phone. Sent letter. °

## 2013-12-07 NOTE — Discharge Instructions (Signed)
Round Ligament Pain During Pregnancy Round ligament pain is a sharp pain or jabbing feeling often felt in the lower belly or groin area on one or both sides. It is one of the most common complaints during pregnancy and is considered a normal part of pregnancy. It is most often felt during the second trimester.  Here is what you need to know about round ligament pain, including some tips to help you feel better.  Causes of Round Ligament Pain  Several thick ligaments surround and support your womb (uterus) as it grows during pregnancy. One of them is called the round ligament.  The round ligament connects the front part of the womb to your groin, the area where your legs attach to your pelvis. The round ligament normally tightens and relaxes slowly.  As your baby and womb grow, the round ligament stretches. That makes it more likely to become strained.  Sudden movements can cause the ligament to tighten quickly, like a rubber band snapping. This causes a sudden and quick jabbing feeling.  Symptoms of Round Ligament Pain  Round ligament pain can be concerning and uncomfortable. But it is considered normal as your body changes during pregnancy.  The symptoms of round ligament pain include a sharp, sudden spasm in the belly. It usually affects the right side, but it may happen on both sides. The pain only lasts a few seconds.  Exercise may cause the pain, as will rapid movements such as:  sneezing coughing laughing rolling over in bed standing up too quickly  Treatment of Round Ligament Pain  Here are some tips that may help reduce your discomfort:  Pain relief. Take over-the-counter acetaminophen for pain, if necessary. Ask your doctor if this is OK.  Exercise. Get plenty of exercise to keep your stomach (core) muscles strong. Doing stretching exercises or prenatal yoga can be helpful. Ask your doctor which exercises are safe for you and your baby.  A helpful exercise involves  putting your hands and knees on the floor, lowering your head, and pushing your backside into the air.  Avoid sudden movements. Change positions slowly (such as standing up or sitting down) to avoid sudden movements that may cause stretching and pain.  Flex your hips. Bend and flex your hips before you cough, sneeze, or laugh to avoid pulling on the ligaments.  Apply warmth. A heating pad or warm bath may be helpful. Ask your doctor if this is OK. Extreme heat can be dangerous to the baby.  You should try to modify your daily activity level and avoid positions that may worsen the condition.  When to Call the Doctor/Midwife  Always tell your doctor or midwife about any type of pain you have during pregnancy. Round ligament pain is quick and doesn't last long.  Call your health care provider immediately if you have:  severe pain fever chills pain on urination difficulty walking  Belly pain during pregnancy can be due to many different causes. It is important for your doctor to rule out more serious conditions, including pregnancy complications such as placenta abruption or non-pregnancy illnesses such as:  inguinal hernia appendicitis stomach, liver, and kidney problems Preterm labor pains may sometimes be mistaken for round ligament pain. Chlamydia Chlamydia is an infection. It is spread through sexual contact. Chlamydia can be in different areas of the body. These areas include the cervix, urethra, throat, or rectum. You may not know you have chlamydia because many people never develop the symptoms. Chlamydia is not difficult to  treat once you know you have it. However, if it is left untreated, chlamydia can lead to more serious health problems.  CAUSES  Chlamydia is caused by bacteria. It is a sexually transmitted disease. It is passed from an infected partner during intimate contact. This contact could be with the genitals, mouth, or rectal area. Chlamydia can also be passed from  mothers to babies during birth. SIGNS AND SYMPTOMS  There may not be any symptoms. This is often the case early in the infection. If symptoms develop, they may include:  Mild pain and discomfort when urinating.  Redness, soreness, and swelling (inflammation) of the rectum.  Vaginal discharge.  Painful intercourse.  Abdominal pain.  Bleeding between menstrual periods. DIAGNOSIS  To diagnose this infection, your health care provider will do a pelvic exam. Cultures will be taken of the vagina, cervix, urine, and possibly the rectum to verify the diagnosis.  TREATMENT You will be given antibiotic medicines. If you are pregnant, certain types of antibiotics will need to be avoided. Any sexual partners should also be treated, even if they do not show symptoms.  HOME CARE INSTRUCTIONS   Take your antibiotic medicine as directed by your health care provider. Finish the antibiotic even if you start to feel better.  Take medicines only as directed by your health care provider.  Inform any sexual partners about the infection. They should also be treated.  Do not have sexual contact until your health care provider tells you it is okay.  Get plenty of rest.  Eat a well-balanced diet.  Drink enough fluids to keep your urine clear or pale yellow.  Keep all follow-up visits as directed by your health care provider. SEEK MEDICAL CARE IF:  You have painful urination.  You have abdominal pain.  You have vaginal discharge.  You have painful sexual intercourse.  You have bleeding between periods and after sex.  You have a fever. SEEK IMMEDIATE MEDICAL CARE IF:   You experience nausea or vomiting.  You experience excessive sweating (diaphoresis).  You have difficulty swallowing. MAKE SURE YOU:   Understand these instructions.  Will watch your condition.  Will get help right away if you are not doing well or get worse. Document Released: 01/25/2005 Document Revised: 09/01/2013  Document Reviewed: 12/23/2012 [facility] Patient Information 2015 [place], [place]. This information is not intended to replace advice given to you by your health care provider. Make sure you discuss any questions you have with your health care provider.

## 2013-12-08 LAB — URINE CULTURE: Colony Count: 30000

## 2014-01-01 ENCOUNTER — Telehealth (HOSPITAL_COMMUNITY): Payer: Self-pay

## 2014-01-01 NOTE — ED Notes (Signed)
Unable to contact pt by mail or telephone. Unable to communicate lab results or treatment changes. 

## 2014-02-15 ENCOUNTER — Encounter (HOSPITAL_COMMUNITY): Payer: Self-pay | Admitting: Emergency Medicine

## 2014-02-15 ENCOUNTER — Inpatient Hospital Stay (HOSPITAL_COMMUNITY)
Admission: EM | Admit: 2014-02-15 | Discharge: 2014-02-21 | DRG: 766 | Disposition: A | Discharge status: Home / Self Care | Payer: Medicaid Other | Attending: Obstetrics & Gynecology | Admitting: Obstetrics & Gynecology

## 2014-02-15 ENCOUNTER — Inpatient Hospital Stay (HOSPITAL_COMMUNITY): Payer: Medicaid Other

## 2014-02-15 DIAGNOSIS — O133 Gestational [pregnancy-induced] hypertension without significant proteinuria, third trimester: Secondary | ICD-10-CM

## 2014-02-15 DIAGNOSIS — R609 Edema, unspecified: Secondary | ICD-10-CM

## 2014-02-15 DIAGNOSIS — N289 Disorder of kidney and ureter, unspecified: Secondary | ICD-10-CM | POA: Diagnosis present

## 2014-02-15 DIAGNOSIS — O9081 Anemia of the puerperium: Secondary | ICD-10-CM | POA: Diagnosis present

## 2014-02-15 DIAGNOSIS — O0930 Supervision of pregnancy with insufficient antenatal care, unspecified trimester: Secondary | ICD-10-CM

## 2014-02-15 DIAGNOSIS — Z3A33 33 weeks gestation of pregnancy: Secondary | ICD-10-CM | POA: Diagnosis present

## 2014-02-15 DIAGNOSIS — O1490 Unspecified pre-eclampsia, unspecified trimester: Secondary | ICD-10-CM

## 2014-02-15 DIAGNOSIS — O0933 Supervision of pregnancy with insufficient antenatal care, third trimester: Secondary | ICD-10-CM

## 2014-02-15 DIAGNOSIS — O1493 Unspecified pre-eclampsia, third trimester: Secondary | ICD-10-CM

## 2014-02-15 DIAGNOSIS — O1414 Severe pre-eclampsia complicating childbirth: Secondary | ICD-10-CM | POA: Diagnosis present

## 2014-02-15 DIAGNOSIS — O1413 Severe pre-eclampsia, third trimester: Principal | ICD-10-CM | POA: Diagnosis present

## 2014-02-15 DIAGNOSIS — O09299 Supervision of pregnancy with other poor reproductive or obstetric history, unspecified trimester: Secondary | ICD-10-CM | POA: Diagnosis present

## 2014-02-15 HISTORY — DX: Gestational (pregnancy-induced) hypertension without significant proteinuria, unspecified trimester: O13.9

## 2014-02-15 LAB — URINALYSIS, ROUTINE W REFLEX MICROSCOPIC
Bilirubin Urine: NEGATIVE
GLUCOSE, UA: NEGATIVE mg/dL
KETONES UR: NEGATIVE mg/dL
LEUKOCYTES UA: NEGATIVE
Nitrite: NEGATIVE
Specific Gravity, Urine: 1.021 (ref 1.005–1.030)
UROBILINOGEN UA: 0.2 mg/dL (ref 0.0–1.0)
pH: 6 (ref 5.0–8.0)

## 2014-02-15 LAB — URINE MICROSCOPIC-ADD ON

## 2014-02-15 LAB — COMPREHENSIVE METABOLIC PANEL
ALK PHOS: 113 U/L (ref 39–117)
ALT: 12 U/L (ref 0–35)
ALT: 11 U/L (ref 0–35)
AST: 22 U/L (ref 0–37)
AST: 17 U/L (ref 0–37)
Albumin: 2.2 g/dL — ABNORMAL LOW (ref 3.5–5.2)
Albumin: 2.1 g/dL — ABNORMAL LOW (ref 3.5–5.2)
Alkaline Phosphatase: 120 U/L — ABNORMAL HIGH (ref 39–117)
Anion gap: 13 (ref 5–15)
Anion gap: 15 (ref 5–15)
BILIRUBIN TOTAL: 0.2 mg/dL — AB (ref 0.3–1.2)
BUN: 16 mg/dL (ref 6–23)
BUN: 17 mg/dL (ref 6–23)
CALCIUM: 7.7 mg/dL — AB (ref 8.4–10.5)
CO2: 18 meq/L — AB (ref 19–32)
CO2: 17 mEq/L — ABNORMAL LOW (ref 19–32)
CREATININE: 1.09 mg/dL (ref 0.50–1.10)
Calcium: 8.2 mg/dL — ABNORMAL LOW (ref 8.4–10.5)
Chloride: 106 mEq/L (ref 96–112)
Chloride: 107 mEq/L (ref 96–112)
Creatinine, Ser: 1.13 mg/dL — ABNORMAL HIGH (ref 0.50–1.10)
GFR calc Af Amer: 80 mL/min — ABNORMAL LOW (ref >90)
GFR, EST AFRICAN AMERICAN: 83 mL/min — AB (ref >90)
GFR, EST NON AFRICAN AMERICAN: 72 mL/min — AB (ref >90)
GFR, EST NON AFRICAN AMERICAN: 69 mL/min — AB (ref >90)
GLUCOSE: 65 mg/dL — AB (ref 70–99)
GLUCOSE: 131 mg/dL — AB (ref 70–99)
POTASSIUM: 3.5 meq/L — AB (ref 3.7–5.3)
Potassium: 4.2 mEq/L (ref 3.7–5.3)
Sodium: 137 mEq/L (ref 137–147)
Sodium: 139 mEq/L (ref 137–147)
Total Bilirubin: <0.2 mg/dL — ABNORMAL LOW (ref 0.3–1.2)
Total Protein: 5.8 g/dL — ABNORMAL LOW (ref 6.0–8.3)
Total Protein: 5.4 g/dL — ABNORMAL LOW (ref 6.0–8.3)

## 2014-02-15 LAB — CBC WITH DIFFERENTIAL/PLATELET
BASOS PCT: 1 % (ref 0–1)
Basophils Absolute: 0.1 10*3/uL (ref 0.0–0.1)
EOS PCT: 1 % (ref 0–5)
Eosinophils Absolute: 0.1 10*3/uL (ref 0.0–0.7)
HCT: 34 % — ABNORMAL LOW (ref 36.0–46.0)
Hemoglobin: 11.4 g/dL — ABNORMAL LOW (ref 12.0–15.0)
LYMPHS ABS: 1.7 10*3/uL (ref 0.7–4.0)
Lymphocytes Relative: 19 % (ref 12–46)
MCH: 27.4 pg (ref 26.0–34.0)
MCHC: 33.5 g/dL (ref 30.0–36.0)
MCV: 81.7 fL (ref 78.0–100.0)
MONOS PCT: 9 % (ref 3–12)
Monocytes Absolute: 0.8 10*3/uL (ref 0.1–1.0)
Neutro Abs: 6.2 10*3/uL (ref 1.7–7.7)
Neutrophils Relative %: 70 % (ref 43–77)
Platelets: ADEQUATE 10*3/uL (ref 150–400)
RBC: 4.16 MIL/uL (ref 3.87–5.11)
RDW: 13.7 % (ref 11.5–15.5)
WBC: 8.9 10*3/uL (ref 4.0–10.5)

## 2014-02-15 LAB — TYPE AND SCREEN
ABO/RH(D): B NEG
Antibody Screen: NEGATIVE

## 2014-02-15 LAB — CBC
HCT: 32.1 % — ABNORMAL LOW (ref 36.0–46.0)
HEMOGLOBIN: 10.7 g/dL — AB (ref 12.0–15.0)
MCH: 27.4 pg (ref 26.0–34.0)
MCHC: 33.3 g/dL (ref 30.0–36.0)
MCV: 82.1 fL (ref 78.0–100.0)
PLATELETS: 250 10*3/uL (ref 150–400)
RBC: 3.91 MIL/uL (ref 3.87–5.11)
RDW: 13.9 % (ref 11.5–15.5)
WBC: 10.7 10*3/uL — AB (ref 4.0–10.5)

## 2014-02-15 LAB — PLATELET COUNT: PLATELETS: 241 10*3/uL (ref 150–400)

## 2014-02-15 LAB — GLUCOSE TOLERANCE, 1 HOUR: Glucose, 1 Hour GTT: 132 mg/dL (ref 70–140)

## 2014-02-15 LAB — PROTEIN / CREATININE RATIO, URINE
Creatinine, Urine: 127.07 mg/dL
PROTEIN CREATININE RATIO: 15.56 — AB (ref 0.00–0.15)
Total Protein, Urine: 1977.6 mg/dL

## 2014-02-15 LAB — ABO/RH: ABO/RH(D): B NEG

## 2014-02-15 LAB — MAGNESIUM: Magnesium: 2 mg/dL (ref 1.5–2.5)

## 2014-02-15 MED ORDER — HYDRALAZINE HCL 20 MG/ML IJ SOLN
10.0000 mg | Freq: Once | INTRAMUSCULAR | Status: AC
  Administered 2014-02-15 (×2): 10 mg via INTRAVENOUS
  Filled 2014-02-15: qty 1

## 2014-02-15 MED ORDER — LABETALOL HCL 5 MG/ML IV SOLN
20.0000 mg | INTRAVENOUS | Status: DC | PRN
  Administered 2014-02-15 (×2): 20 mg via INTRAVENOUS
  Filled 2014-02-15 (×2): qty 4

## 2014-02-15 MED ORDER — LABETALOL HCL 5 MG/ML IV SOLN
20.0000 mg | Freq: Once | INTRAVENOUS | Status: AC
  Administered 2014-02-15: 20 mg via INTRAVENOUS
  Filled 2014-02-15: qty 4

## 2014-02-15 MED ORDER — LABETALOL HCL 200 MG PO TABS
200.0000 mg | ORAL_TABLET | Freq: Once | ORAL | Status: AC
  Administered 2014-02-15: 200 mg via ORAL
  Filled 2014-02-15: qty 1

## 2014-02-15 MED ORDER — ZOLPIDEM TARTRATE 5 MG PO TABS
5.0000 mg | ORAL_TABLET | Freq: Every evening | ORAL | Status: DC | PRN
Start: 2014-02-15 — End: 2014-02-17
  Administered 2014-02-16: 5 mg via ORAL
  Filled 2014-02-15: qty 1

## 2014-02-15 MED ORDER — LABETALOL HCL 200 MG PO TABS
200.0000 mg | ORAL_TABLET | Freq: Two times a day (BID) | ORAL | Status: DC
  Administered 2014-02-15 (×2): 200 mg via ORAL
  Filled 2014-02-15 (×3): qty 1

## 2014-02-15 MED ORDER — MAGNESIUM SULFATE 40 G IN LACTATED RINGERS - SIMPLE
2.0000 g/h | INTRAVENOUS | Status: DC
Start: 2014-02-15 — End: 2014-02-17
  Administered 2014-02-16 – 2014-02-17 (×2): 2 g/h via INTRAVENOUS
  Filled 2014-02-15 (×3): qty 500

## 2014-02-15 MED ORDER — BETAMETHASONE SOD PHOS & ACET 6 (3-3) MG/ML IJ SUSP
12.0000 mg | INTRAMUSCULAR | Status: AC
  Administered 2014-02-15 – 2014-02-16 (×2): 12 mg via INTRAMUSCULAR
  Filled 2014-02-15 (×2): qty 2

## 2014-02-15 MED ORDER — PRENATAL MULTIVITAMIN CH
1.0000 | ORAL_TABLET | Freq: Every day | ORAL | Status: DC
  Administered 2014-02-15: 1 via ORAL
  Filled 2014-02-15: qty 1

## 2014-02-15 MED ORDER — CALCIUM CARBONATE ANTACID 500 MG PO CHEW: 2.0000 | CHEWABLE_TABLET | ORAL | Status: DC | PRN

## 2014-02-15 MED ORDER — LABETALOL HCL 200 MG PO TABS
400.0000 mg | ORAL_TABLET | Freq: Two times a day (BID) | ORAL | Status: DC
  Administered 2014-02-16 – 2014-02-19 (×8): 400 mg via ORAL
  Filled 2014-02-15 (×9): qty 2

## 2014-02-15 MED ORDER — DOCUSATE SODIUM 100 MG PO CAPS
100.0000 mg | ORAL_CAPSULE | Freq: Every day | ORAL | Status: DC
  Administered 2014-02-15: 100 mg via ORAL
  Filled 2014-02-15: qty 1

## 2014-02-15 MED ORDER — LACTATED RINGERS IV SOLN
INTRAVENOUS | Status: DC
  Administered 2014-02-15 – 2014-02-17 (×6): via INTRAVENOUS

## 2014-02-15 MED ORDER — ACETAMINOPHEN 325 MG PO TABS: 650.0000 mg | ORAL_TABLET | ORAL | Status: DC | PRN

## 2014-02-15 MED ORDER — MAGNESIUM SULFATE BOLUS VIA INFUSION
4.0000 g | Freq: Once | INTRAVENOUS | Status: AC
  Administered 2014-02-15: 4 g via INTRAVENOUS
  Filled 2014-02-15: qty 500

## 2014-02-15 NOTE — ED Notes (Signed)
Report given to Nicole, RN

## 2014-02-15 NOTE — Progress Notes (Signed)
This note also relates to the following rows which could not be included: Pulse Rate - Cannot attach notes to unvalidated device data Resp - Cannot attach notes to unvalidated device data SpO2 - Cannot attach notes to unvalidated device data   fhr monitored on paper for about 10 minutes prior to capturing in obix

## 2014-02-15 NOTE — Progress Notes (Signed)
This note also relates to the following rows which could not be included: Pulse Rate - Cannot attach notes to unvalidated device data Resp - Cannot attach notes to unvalidated device data SpO2 - Cannot attach notes to unvalidated device data   RROB spoke with Dr Preston FleetingGlick and told about pt s/s; RROB called Dr Jolayne Pantheronstant and told of pt's s/s asked for orders, orders for cbc, cmet, urine protein/creatinine ratio, labetalol 20mg  ivp, pt to be transferred to [facility]

## 2014-02-15 NOTE — ED Notes (Signed)
OB rapid response RN's at bedside.

## 2014-02-15 NOTE — ED Provider Notes (Signed)
CSN: [id number]     Arrival date & time 02/15/14  0127 History   First MD Initiated Contact with Patient 02/15/14 [phone]     Chief Complaint  Patient presents with  . Leg Swelling     (Consider location/radiation/quality/duration/timing/severity/associated sxs/prior Treatment) The history is provided by the patient.   23 year old female who is [redacted] weeks pregnant G1, P0, A0 comes in because of leg swelling and difficulty breathing. She has noted foot swelling over about the last 6 weeks but over the last 2 weeks, it has spread to involve her lower leg. Over the last 3 days, she has noted that she gets out of breath if she lays flat and wakes up during the middle of the night with difficulty breathing. She denies any headache. There's been no nausea or vomiting. Of note, she has not been receiving prenatal care.  Past Medical History  Diagnosis Date  . Medical history non-contributory    Past Surgical History  Procedure Laterality Date  . No past surgeries     Family History  Problem Relation Age of Onset  . Hypertension Mother   . Hypertension Father    History  Substance Use Topics  . Smoking status: Never Smoker   . Smokeless tobacco: Never Used  . Alcohol Use: No   OB History   Grav Para Term Preterm Abortions TAB SAB Ect Mult Living   1    0   0       Review of Systems  All other systems reviewed and are negative.     Allergies  Review of patient's allergies indicates no known allergies.  Home Medications   Prior to Admission medications   Medication Sig Start Date End Date Taking? Authorizing Provider  Prenatal Vit-Fe Fumarate-FA (PRENATAL MULTIVITAMIN) TABS tablet Take 1 tablet by mouth daily at 12 noon.    Historical Provider, MD   BP 174/123  Pulse 92  Temp(Src) 98.2 F (36.8 C) (Oral)  Resp 23  Wt 140 lb (63.504 kg)  SpO2 100%  LMP 06/29/2013 Physical Exam  Nursing note and vitals reviewed.  23 year old female, resting comfortably and in no acute  distress. Vital signs are significant for severe hypertension, and tachypnea. Oxygen saturation is 100%, which is normal. Head is normocephalic and atraumatic. PERRLA, EOMI. Oropharynx is clear. Fundi are normal. Neck is nontender and supple without adenopathy or JVD. Back is nontender and there is no CVA tenderness. There is 2+ presacral edema. Lungs are clear without rales, wheezes, or rhonchi. Chest is nontender. Heart has regular rate and rhythm without murmur. Abdomen: Gravid uterus is present and nontender. There are no other masses or hepatosplenomegaly and peristalsis is normoactive. Extremities have 2-3+ edema, full range of motion is present. Skin is warm and dry without rash. Neurologic: Mental status is normal, cranial nerves are intact, there are no motor or sensory deficits.  ED Course  Procedures (including critical care time) Labs Review Results for orders placed during the hospital encounter of 02/15/14  CBC WITH DIFFERENTIAL      Result Value Ref Range   WBC 8.9  4.0 - 10.5 K/uL   RBC 4.16  3.87 - 5.11 MIL/uL   Hemoglobin 11.4 (*) 12.0 - 15.0 g/dL   HCT 44.0 (*) 34.7 - 42.5 %   MCV 81.7  78.0 - 100.0 fL   MCH 27.4  26.0 - 34.0 pg   MCHC 33.5  30.0 - 36.0 g/dL   RDW 95.6  38.7 - 56.4 %  Platelets    150 - 400 K/uL   Value: PLATELET CLUMPS NOTED ON SMEAR, COUNT APPEARS ADEQUATE   Neutrophils Relative % 70  43 - 77 %   Lymphocytes Relative 19  12 - 46 %   Monocytes Relative 9  3 - 12 %   Eosinophils Relative 1  0 - 5 %   Basophils Relative 1  0 - 1 %   Neutro Abs 6.2  1.7 - 7.7 K/uL   Lymphs Abs 1.7  0.7 - 4.0 K/uL   Monocytes Absolute 0.8  0.1 - 1.0 K/uL   Eosinophils Absolute 0.1  0.0 - 0.7 K/uL   Basophils Absolute 0.1  0.0 - 0.1 K/uL  COMPREHENSIVE METABOLIC PANEL      Result Value Ref Range   Sodium 137  137 - 147 mEq/L   Potassium 4.2  3.7 - 5.3 mEq/L   Chloride 106  96 - 112 mEq/L   CO2 18 (*) 19 - 32 mEq/L   Glucose, Bld 65 (*) 70 - 99 mg/dL   BUN  16  6 - 23 mg/dL   Creatinine, Ser 9.601.09  0.50 - 1.10 mg/dL   Calcium 8.2 (*) 8.4 - 10.5 mg/dL   Total Protein 5.8 (*) 6.0 - 8.3 g/dL   Albumin 2.2 (*) 3.5 - 5.2 g/dL   AST 22  0 - 37 U/L   ALT 12  0 - 35 U/L   Alkaline Phosphatase 120 (*) 39 - 117 U/L   Total Bilirubin 0.2 (*) 0.3 - 1.2 mg/dL   GFR calc non Af Amer 72 (*) >90 mL/min   GFR calc Af Amer 83 (*) >90 mL/min   Anion gap 13  5 - 15  URINALYSIS, ROUTINE W REFLEX MICROSCOPIC      Result Value Ref Range   Color, Urine YELLOW  YELLOW   APPearance CLOUDY (*) CLEAR   Specific Gravity, Urine 1.021  1.005 - 1.030   pH 6.0  5.0 - 8.0   Glucose, UA NEGATIVE  NEGATIVE mg/dL   Hgb urine dipstick MODERATE (*) NEGATIVE   Bilirubin Urine NEGATIVE  NEGATIVE   Ketones, ur NEGATIVE  NEGATIVE mg/dL   Protein, ur >454>300 (*) NEGATIVE mg/dL   Urobilinogen, UA 0.2  0.0 - 1.0 mg/dL   Nitrite NEGATIVE  NEGATIVE   Leukocytes, UA NEGATIVE  NEGATIVE  MAGNESIUM      Result Value Ref Range   Magnesium 2.0  1.5 - 2.5 mg/dL  URINE MICROSCOPIC-ADD ON      Result Value Ref Range   Squamous Epithelial / LPF FEW (*) RARE   WBC, UA 0-2  <3 WBC/hpf   RBC / HPF 3-6  <3 RBC/hpf   Bacteria, UA FEW (*) RARE   Casts HYALINE CASTS (*) NEGATIVE    EKG Interpretation   Date/Time:  Sunday February 15 2014 01:33:35 EDT Ventricular Rate:  93 PR Interval:  140 QRS Duration: 66 QT Interval:  386 QTC Calculation: 479 R Axis:   90 Text Interpretation:  Normal sinus rhythm Rightward axis Cannot rule out  Anterior infarct , age undetermined Abnormal ECG No old tracing to compare  Confirmed by [facility]  MD, Dorian Duval ([id number]) on 02/15/2014 2:25:34 AM      CRITICAL CARE Performed by: BJYNW,GNFAOGLICK,Josedaniel Haye Total critical care time: 35 minutes Critical care time was exclusive of separately billable procedures and treating other patients. Critical care was necessary to treat or prevent imminent or life-threatening deterioration. Critical care was time spent personally by me on  the following activities: development of treatment plan with patient and/or surrogate as well as nursing, discussions with consultants, evaluation of patient's response to treatment, examination of patient, obtaining history from patient or surrogate, ordering and performing treatments and interventions, ordering and review of laboratory studies, ordering and review of radiographic studies, pulse oximetry and re-evaluation of patient's condition.  MDM   Final diagnoses:  Pregnancy induced hypertension, third trimester  Peripheral edema    Pregnancy-induced hypertension with peripheral edema. Old records are reviewed and she had been seen on August 9 and July 31 at which time blood pressure was mildly elevated. Because of severity of her blood pressure and the fact that she has not had prenatal care, she will need to be observed in the surgical unit. The case has been discussed with Dr. Jolayne Pantheronstant [facility] who agrees to accept the patient in transfer. She is given a dose of labetalol in the ED prior to transfer.    Dione Boozeavid Marquisa Salih, MD 02/15/14 [phone]

## 2014-02-15 NOTE — Progress Notes (Signed)
[facility]. 162 via wc

## 2014-02-15 NOTE — ED Notes (Signed)
Patient placed on fetal monitor. 

## 2014-02-15 NOTE — ED Notes (Signed)
Pt [redacted] weeks pregnant. Complaining of bilateral lower leg swelling. Has not had regular prenatal care. Denies leg cramping or abdominal cramping. Rapid Response OB RN at bedside.

## 2014-02-15 NOTE — Plan of Care (Signed)
Problem: Phase I Progression Outcomes Goal: Other Phase I Outcomes/Goals Outcome: Progressing Patient has been admitted for  Pregnancy induced hypertension very swollen.  Patient on Labetalol and magnesium , monitoring blood pressure closely

## 2014-02-15 NOTE — Progress Notes (Signed)
Sharen CounterLisa Leftwich-Kirby CNM notified of pt's admission and status. [facility]ware [facility]. Aware of elevated B/Ps with swelling. [facility] with hx of swelling and difficulty breathing. Will see pt

## 2014-02-15 NOTE — Plan of Care (Signed)
Problem: Phase I Progression Outcomes Goal: Diabetic Assessment Outcome: Progressing Pt had 1 hour glucola today with education . Goal: Hemodynamically stable Discussed signs and symptoms of preeclampsia: worsening headache, blurred vision, epigastric pain. Pt encouraged to drink po fluids normally. She has been self restricting all water and po fluids because of her "swelling". Discussed nature and physiology of preeclampsia and that it is a disease of pregnancy that can occur at any gestation. Also discussed the reasons for magnesium and the plan to use labetalol to control bp's on a daily basis to gain more time in pregnancy. Pt states understanding.

## 2014-02-15 NOTE — H&P (Signed)
Attestation of Attending Supervision of Advanced Practitioner (CNM/NP): Evaluation and management procedures were performed by the Advanced Practitioner under my supervision and collaboration.  I have reviewed the Advanced Practitioner's note and chart, and I agree with the management and plan.  Arlys Scatena 02/15/2014 7:51 AM   

## 2014-02-15 NOTE — Plan of Care (Signed)
Problem: Phase I Progression Outcomes Goal: Other Phase I Outcomes/Goals Outcome: Progressing  Patient is 33 weeks .Magnesium started immediately patient got to the unit. 4 grams bolus and 2 grams / hour continuous. Labetalol 200 mg oral 2 times daily. Blood pressures  Monitored closely. Limited prenatal care

## 2014-02-15 NOTE — Plan of Care (Signed)
Problem: Consults Goal: Birthing Suites Patient Information Press F2 to bring up selections list   Pt < [redacted] weeks EGA, PIH (Pregnancy induced hypertension) and Antenatal Patient (< 37 weeks)

## 2014-02-15 NOTE — H&P (Signed)
[name] [name] is a 23 y.o. female G1P0000 @[redacted]w[redacted]d  [facility] leg/ankle swelling, and shortness of breath when lying down.  Upon arrival in ED, BPs found to be severe range 160s/100s.  BPs continued in severe range despite doses of IV Labetalol Pt has had limited prenatal care, with one initial visit with [facility] but was told that they no longer accept her [employer] insurance.  She applied for pregnancy Medicaid and was approved but was told they will not see her with Medicaid either.  She was given phone numbers for [facility] and did call but was told there were no appointments for 2 months.  She is concerned that she has not had care and worried about the pregnancy and the baby.  She reports good fetal movement, denies LOF, vaginal bleeding, vaginal itching/burning, urinary symptoms, h/a, epigastric pain, visual disturbances, dizziness, n/v, or fever/chills.     Maternal Medical History:  Reason for admission: Nausea.  Contractions: Frequency: rare.   Perceived severity is mild.    Fetal activity: Perceived fetal activity is normal.   Last perceived fetal movement was within the past hour.      OB History   Grav Para Term Preterm Abortions TAB SAB Ect Mult Living   1    0   0       Past Medical History  Diagnosis Date  . Medical history non-contributory   . Pregnancy induced hypertension    Past Surgical History  Procedure Laterality Date  . No past surgeries     Family History: family history includes Hypertension in her father and mother. Social History:  reports that she has never smoked. She has never used smokeless tobacco. She reports that she does not drink alcohol or use illicit drugs.   Prenatal Transfer Tool  Maternal Diabetes: No Genetic Screening: Declined Maternal Ultrasounds/Referrals: Abnormal:  Findings:   Other: Fetal Ultrasounds or other Referrals:  None Maternal Substance Abuse:  No Significant Maternal Medications:  None Significant Maternal Lab  Results:  Lab values include: Other:  Other Comments:  No second or third trimester U/S in pregnancy, Limited prenatal care, GBS unknown  Review of Systems  Constitutional: Negative for fever, chills and malaise/fatigue.  Eyes: Negative for blurred vision.  Respiratory: Positive for shortness of breath. Negative for cough.   Cardiovascular: Positive for leg swelling. Negative for chest pain.  Gastrointestinal: Negative for heartburn, nausea, vomiting and abdominal pain.  Genitourinary: Negative for dysuria, urgency and frequency.  Musculoskeletal: Negative.   Neurological: Negative for dizziness and headaches.  Psychiatric/Behavioral: Negative for depression.      Blood pressure 150/101, pulse 103, temperature 97.7 F (36.5 C), temperature source Oral, resp. rate 20, weight 63.504 kg (140 lb), last menstrual period 06/29/2013, SpO2 100.00%. Maternal Exam:  Uterine Assessment: Contraction frequency is rare.      Fetal Exam Fetal Monitor Review: Mode: ultrasound.   Baseline rate: 135.  Variability: moderate (6-25 bpm).   Pattern: accelerations present and no decelerations.    Fetal State Assessment: Category I - tracings are normal.     Physical Exam  Nursing note and vitals reviewed. Constitutional: She is oriented to person, place, and time. She appears well-developed and well-nourished.  Neck: Normal range of motion.  Cardiovascular: Normal rate, regular rhythm and normal heart sounds.   Respiratory: Effort normal and breath sounds normal.  GI: Soft.  Musculoskeletal: Normal range of motion.  Neurological: She is alert and oriented to person, place, and time. She displays abnormal reflex.  Brisk reflexes  Skin: Skin is warm and dry.  Psychiatric: She has a normal mood and affect. Her behavior is normal. Judgment and thought content normal.    Prenatal labs: ABO, Rh: --/--/B NEG (05/07 1524) Antibody: NEG (05/07 1524) Rubella: 0.81 (04/16 1426) RPR: NON REAC (07/31  1750)  HBsAg: NEGATIVE (04/16 1426)  HIV: NONREACTIVE (07/31 1750)  GBS:   Unknown  Assessment/Plan: 23 y.o. G1P0000 @[redacted]w[redacted]d   1. Pregnancy induced hypertension, third trimester   2. Peripheral edema   3. Limited prenatal care, unspecified trimester   4. Preeclampsia, third trimester    Consult Dr Jolayne Pantheronstant Admit to antepartum Mag sulfate 4g bolus then 2g/hour 1 hour glucose screen before BMZ BMZ x 2 in 24 hours Labetalol IV PRN Labetalol PO 200 mg BID scheduled Anatomy U/S in am  [name], [name] [name] 02/15/2014, 5:08 AM

## 2014-02-15 NOTE — Progress Notes (Signed)
This note also relates to the following rows which could not be included: Pulse Rate - Cannot attach notes to unvalidated device data Resp - Cannot attach notes to unvalidated device data SpO2 - Cannot attach notes to unvalidated device data   20mg  labetalol ivp

## 2014-02-15 NOTE — Progress Notes (Signed)
RROB updated Dr Jolayne Pantheronstant on pt's most recent bp- orders received

## 2014-02-15 NOTE — Plan of Care (Signed)
Problem: Consults Goal: Birthing Suites Patient Information Press F2 to bring up selections list  Outcome: Progressing  PIH (Pregnancy induced hypertension)

## 2014-02-15 NOTE — Plan of Care (Signed)
Problem: Phase I Progression Outcomes Goal: Hemodynamically stable PREECLAMPSIA handout given to patient.

## 2014-02-15 NOTE — ED Notes (Addendum)
C/o feet calf and thigh swelling. Worse when lying down. "seems to come up to my chest, back and neck when lying down. Lasts about 2 hrs after getting up. Also reports nausea. Last [facility] 2 hrs ago(soft[facility] Last appt in July. EDD 12/6. Alert, NAD, calm, interactive, resps e/u, speaking in clear complete sentences.  Last felt baby move 1 hr ago.

## 2014-02-16 ENCOUNTER — Encounter (HOSPITAL_COMMUNITY): Payer: Self-pay | Admitting: *Deleted

## 2014-02-16 LAB — OB RESULTS CONSOLE GBS: GBS: NEGATIVE

## 2014-02-16 LAB — GROUP B STREP BY PCR: Group B strep by PCR: NEGATIVE

## 2014-02-16 LAB — MRSA PCR SCREENING: MRSA BY PCR: NEGATIVE

## 2014-02-16 MED ORDER — OXYCODONE-ACETAMINOPHEN 5-325 MG PO TABS: 2.0000 | ORAL_TABLET | ORAL | Status: DC | PRN

## 2014-02-16 MED ORDER — CITRIC ACID-SODIUM CITRATE 334-500 MG/5ML PO SOLN
30.0000 mL | Freq: Once | ORAL | Status: AC
  Administered 2014-02-16: 30 mL via ORAL
  Filled 2014-02-16: qty 15

## 2014-02-16 MED ORDER — ACETAMINOPHEN 325 MG PO TABS: 650.0000 mg | ORAL_TABLET | ORAL | Status: DC | PRN

## 2014-02-16 MED ORDER — OXYTOCIN 40 UNITS IN LACTATED RINGERS INFUSION - SIMPLE MED: 62.5000 mL/h | INTRAVENOUS | Status: DC

## 2014-02-16 MED ORDER — ONDANSETRON HCL 4 MG/2ML IJ SOLN: 4.0000 mg | Freq: Four times a day (QID) | INTRAMUSCULAR | Status: DC | PRN

## 2014-02-16 MED ORDER — FLEET ENEMA 7-19 GM/118ML RE ENEM: 1.0000 | ENEMA | RECTAL | Status: DC | PRN

## 2014-02-16 MED ORDER — OXYTOCIN BOLUS FROM INFUSION: 500.0000 mL | INTRAVENOUS | Status: DC

## 2014-02-16 MED ORDER — TERBUTALINE SULFATE 1 MG/ML IJ SOLN: 0.2500 mg | Freq: Once | INTRAMUSCULAR | Status: AC | PRN

## 2014-02-16 MED ORDER — LIDOCAINE HCL (PF) 1 % IJ SOLN: 30.0000 mL | INTRAMUSCULAR | Status: DC | PRN

## 2014-02-16 MED ORDER — LACTATED RINGERS IV SOLN
INTRAVENOUS | Status: DC
  Administered 2014-02-16: 21:00:00 via INTRAVENOUS

## 2014-02-16 MED ORDER — LACTATED RINGERS IV SOLN: 500.0000 mL | INTRAVENOUS | Status: DC | PRN

## 2014-02-16 MED ORDER — OXYCODONE-ACETAMINOPHEN 5-325 MG PO TABS: 1.0000 | ORAL_TABLET | ORAL | Status: DC | PRN

## 2014-02-16 MED ORDER — MISOPROSTOL 25 MCG QUARTER TABLET
50.0000 ug | ORAL_TABLET | ORAL | Status: DC | PRN
  Administered 2014-02-16 – 2014-02-17 (×4): 50 ug via ORAL
  Filled 2014-02-16 (×2): qty 0.5
  Filled 2014-02-16: qty 1
  Filled 2014-02-16: qty 0.5

## 2014-02-16 NOTE — Progress Notes (Signed)
LABOR PROGRESS NOTE  [name] Young is a 23 y.o. G1P0000 at 511w1d  admitted for induction of labor due to severe pre-eclampsia with worsening renal function.  Subjective: No complaints. Mild cramping since placement of cytotec. Denies headache, scotoma, chest pain, shortness of breath, RUQ abdominal pain, edema or other concerning symptoms.   Objective: BP 157/95  Pulse 93  Temp(Src) 98.2 F (36.8 C) (Oral)  Resp 18  Ht 5\' 3"  (1.6 m)  Wt 140 lb (63.504 kg)  BMI 24.81 kg/m2  SpO2 98%  LMP 06/29/2013 or  Filed Vitals:   02/16/14 1141 02/16/14 1230 02/16/14 1353 02/16/14 1505  BP:  157/95    Pulse:  93    Temp:  98.2 F (36.8 C)    TempSrc:  Oral    Resp: 20 18 20 18   Height:      Weight:      SpO2:        Total I/O In: 1360 [P.O.:360; I.V.:1000] Out: 400 [Urine:400]  FHT:  FHR: 140 bpm, variability: moderate,  accelerations:  Present,  decelerations:  Absent UC:   Rare SVE:   Dilation: Fingertip Effacement (%): Thick Station: -3 Exam by:: J.Cox, RN  Dilation: Fingertip Effacement (%): Thick Cervical Position: Posterior Station: -3 Presentation: Vertex Exam by:: J.Cox, RN  Labs: Lab Results  Component Value Date   WBC 10.7* 02/15/2014   HGB 10.7* 02/15/2014   HCT 32.1* 02/15/2014   MCV 82.1 02/15/2014   PLT 250 02/15/2014   PLT 241 02/15/2014    Assessment / Plan: Induction of labor due to preeclampsia  Labor: Staged induction of labor. Cytotec x1. Continue every 4 hours until cervix ripe enough for FB placement.  Severe Preeclamsia with worsening creatinine: Blood pressure stable. Continue magnesium. Continue to monitor blood pressures, urine output. Fetal Wellbeing:  Category I Pain Control:  Labor support without medications. Considering epidural.  Anticipated MOD:  NSVD  [name] Young, [name] Dilday, MD 02/16/2014, 4:08 PM

## 2014-02-16 NOTE — Progress Notes (Signed)
Patient ID: [name] Young, female   DOB: Oct 16, 1990, 23 y.o.   MRN: [id number] S: denies headache, blurred vision or epigastric pain. O: BP diastolic in 90's, heart RRR, LCTAB, abd soft, gravid and non tender. FHR pattern reassurring, cat 1 tracing. DTR's 2+, no clonus. No calf tenderness, sweeling or pain in lower extremities. A: preg at 33.1 wks pre-eclampsia P: Give 2nd dose of BMX and use expectant management

## 2014-02-16 NOTE — Plan of Care (Signed)
Phone call received from Infection Prevention stating pt is postive for MRSA. This was not flagged on pt chart. Infection Control aware. Stated just to do MRSA PCR swab and then place pt on proper precautions if comes back postive.

## 2014-02-16 NOTE — Consult Note (Signed)
Asked by Dr Adrian BlackwaterStinson to speak to Ms Berline ChoughByas to discuss outcome of preterm pregnancy at 33 weeks. Chart reviewed. She is 33 1/7 weeks, with intact membranes. She was admitted for IOL for preeclampsia.  She has received 2 dose of betamethasone. Currently on Labetalol. EFW based on FUS was 1502 gms, 11%, with echogenic focus on LV.  I discussed presence of NICU Team at delivery and resuscitation.  I also talked about good survival rate and common morbidities associated with this gestation such as possible RDS/TTN with possible need for respiratory support.  Discussed need for temp support, IVF and gavage feeding initially. I also discussed estimated LOS.   She has not decided on breastfeeding but I encouraged her and talked about the benefits of this especially to a preterm baby.   I answered her questions to her satisfaction.   Thank you for inviting [facility] to counsel this mom before birth.   I spent 20 minutes with this consult.   Lucillie Garfinkelita Q Amar Sippel, MD  Neonatologist

## 2014-02-17 ENCOUNTER — Encounter (HOSPITAL_COMMUNITY): Payer: Self-pay | Admitting: Anesthesiology

## 2014-02-17 ENCOUNTER — Inpatient Hospital Stay (HOSPITAL_COMMUNITY): Payer: Medicaid Other | Admitting: Anesthesiology

## 2014-02-17 ENCOUNTER — Encounter (HOSPITAL_COMMUNITY): Payer: Medicaid Other | Admitting: Anesthesiology

## 2014-02-17 ENCOUNTER — Encounter (HOSPITAL_COMMUNITY)
Admission: EM | Disposition: A | Discharge status: Home / Self Care | Payer: Self-pay | Source: Home / Self Care | Attending: Obstetrics & Gynecology

## 2014-02-17 DIAGNOSIS — Z3A33 33 weeks gestation of pregnancy: Secondary | ICD-10-CM

## 2014-02-17 DIAGNOSIS — O1413 Severe pre-eclampsia, third trimester: Secondary | ICD-10-CM

## 2014-02-17 DIAGNOSIS — O1414 Severe pre-eclampsia complicating childbirth: Secondary | ICD-10-CM | POA: Diagnosis present

## 2014-02-17 LAB — COMPREHENSIVE METABOLIC PANEL
ALT: 14 U/L (ref 0–35)
AST: 22 U/L (ref 0–37)
Albumin: 1.8 g/dL — ABNORMAL LOW (ref 3.5–5.2)
Alkaline Phosphatase: 103 U/L (ref 39–117)
Anion gap: 11 (ref 5–15)
BUN: 23 mg/dL (ref 6–23)
CALCIUM: 7.6 mg/dL — AB (ref 8.4–10.5)
CO2: 19 mEq/L (ref 19–32)
Chloride: 105 mEq/L (ref 96–112)
Creatinine, Ser: 1.14 mg/dL — ABNORMAL HIGH (ref 0.50–1.10)
GFR, EST AFRICAN AMERICAN: 79 mL/min — AB (ref >90)
GFR, EST NON AFRICAN AMERICAN: 68 mL/min — AB (ref >90)
GLUCOSE: 103 mg/dL — AB (ref 70–99)
Potassium: 4.9 mEq/L (ref 3.7–5.3)
Sodium: 135 mEq/L — ABNORMAL LOW (ref 137–147)
Total Bilirubin: <0.2 mg/dL — ABNORMAL LOW (ref 0.3–1.2)
Total Protein: 5 g/dL — ABNORMAL LOW (ref 6.0–8.3)

## 2014-02-17 LAB — CBC
HCT: 31.1 % — ABNORMAL LOW (ref 36.0–46.0)
HEMOGLOBIN: 10.4 g/dL — AB (ref 12.0–15.0)
MCH: 27.9 pg (ref 26.0–34.0)
MCHC: 33.4 g/dL (ref 30.0–36.0)
MCV: 83.4 fL (ref 78.0–100.0)
PLATELETS: 315 10*3/uL (ref 150–400)
RBC: 3.73 MIL/uL — ABNORMAL LOW (ref 3.87–5.11)
RDW: 14.3 % (ref 11.5–15.5)
WBC: 16.3 10*3/uL — ABNORMAL HIGH (ref 4.0–10.5)

## 2014-02-17 LAB — RPR

## 2014-02-17 SURGERY — Surgical Case
Anesthesia: Spinal

## 2014-02-17 MED ORDER — DIPHENHYDRAMINE HCL 50 MG/ML IJ SOLN
12.5000 mg | INTRAMUSCULAR | Status: DC | PRN
  Administered 2014-02-18: 12.5 mg via INTRAVENOUS
  Filled 2014-02-17: qty 1

## 2014-02-17 MED ORDER — SIMETHICONE 80 MG PO CHEW
80.0000 mg | CHEWABLE_TABLET | ORAL | Status: DC
  Administered 2014-02-17 – 2014-02-20 (×4): 80 mg via ORAL
  Filled 2014-02-17 (×4): qty 1

## 2014-02-17 MED ORDER — NALBUPHINE HCL 10 MG/ML IJ SOLN: 5.0000 mg | Freq: Once | INTRAMUSCULAR | Status: AC | PRN

## 2014-02-17 MED ORDER — DEXTROSE 5 % IV SOLN
2.0000 g | Freq: Once | INTRAVENOUS | Status: AC
  Administered 2014-02-17: 2 g via INTRAVENOUS
  Filled 2014-02-17: qty 2

## 2014-02-17 MED ORDER — FENTANYL CITRATE 0.05 MG/ML IJ SOLN
25.0000 ug | INTRAMUSCULAR | Status: DC | PRN
  Administered 2014-02-17: 50 ug via INTRAVENOUS
  Administered 2014-02-17: 25 ug via INTRAVENOUS

## 2014-02-17 MED ORDER — ONDANSETRON HCL 4 MG/2ML IJ SOLN
INTRAMUSCULAR | Status: AC
  Filled 2014-02-17: qty 2

## 2014-02-17 MED ORDER — FUROSEMIDE 10 MG/ML IJ SOLN
40.0000 mg | Freq: Four times a day (QID) | INTRAMUSCULAR | Status: AC
  Administered 2014-02-17 – 2014-02-18 (×2): 40 mg via INTRAVENOUS
  Filled 2014-02-17 (×3): qty 4

## 2014-02-17 MED ORDER — HYDRALAZINE HCL 20 MG/ML IJ SOLN
10.0000 mg | INTRAMUSCULAR | Status: DC | PRN
  Administered 2014-02-17: 10 mg via INTRAVENOUS
  Filled 2014-02-17: qty 1

## 2014-02-17 MED ORDER — MEASLES, MUMPS & RUBELLA VAC ~~LOC~~ INJ
0.5000 mL | INJECTION | Freq: Once | SUBCUTANEOUS | Status: DC
Start: 2014-02-18 — End: 2014-02-21
  Filled 2014-02-17: qty 0.5

## 2014-02-17 MED ORDER — METOCLOPRAMIDE HCL 5 MG/ML IJ SOLN: 10.0000 mg | Freq: Once | INTRAMUSCULAR | Status: DC | PRN

## 2014-02-17 MED ORDER — LACTATED RINGERS IV SOLN: INTRAVENOUS | Status: DC

## 2014-02-17 MED ORDER — ONDANSETRON HCL 4 MG/2ML IJ SOLN: 4.0000 mg | INTRAMUSCULAR | Status: DC | PRN

## 2014-02-17 MED ORDER — FENTANYL CITRATE 0.05 MG/ML IJ SOLN
INTRAMUSCULAR | Status: AC
  Filled 2014-02-17: qty 2

## 2014-02-17 MED ORDER — BUPIVACAINE HCL (PF) 0.5 % IJ SOLN
INTRAMUSCULAR | Status: DC | PRN
  Administered 2014-02-17: 30 mL

## 2014-02-17 MED ORDER — MEPERIDINE HCL 25 MG/ML IJ SOLN: 6.2500 mg | INTRAMUSCULAR | Status: DC | PRN

## 2014-02-17 MED ORDER — OXYTOCIN 40 UNITS IN LACTATED RINGERS INFUSION - SIMPLE MED: 62.5000 mL/h | INTRAVENOUS | Status: AC

## 2014-02-17 MED ORDER — DIPHENHYDRAMINE HCL 25 MG PO CAPS: 25.0000 mg | ORAL_CAPSULE | ORAL | Status: DC | PRN

## 2014-02-17 MED ORDER — MAGNESIUM HYDROXIDE 400 MG/5ML PO SUSP
30.0000 mL | ORAL | Status: DC | PRN
  Filled 2014-02-17: qty 30

## 2014-02-17 MED ORDER — DEXAMETHASONE SODIUM PHOSPHATE 4 MG/ML IJ SOLN
INTRAMUSCULAR | Status: AC
  Filled 2014-02-17: qty 1

## 2014-02-17 MED ORDER — MAGNESIUM SULFATE 40 G IN LACTATED RINGERS - SIMPLE
2.0000 g/h | INTRAVENOUS | Status: AC
  Administered 2014-02-18: 2 g/h via INTRAVENOUS
  Filled 2014-02-17 (×2): qty 500

## 2014-02-17 MED ORDER — SCOPOLAMINE 1 MG/3DAYS TD PT72
MEDICATED_PATCH | TRANSDERMAL | Status: AC
  Filled 2014-02-17: qty 1

## 2014-02-17 MED ORDER — FUROSEMIDE 10 MG/ML IJ SOLN
40.0000 mg | Freq: Three times a day (TID) | INTRAMUSCULAR | Status: DC
  Administered 2014-02-17: 40 mg via INTRAVENOUS
  Filled 2014-02-17: qty 4

## 2014-02-17 MED ORDER — OXYCODONE-ACETAMINOPHEN 5-325 MG PO TABS
2.0000 | ORAL_TABLET | ORAL | Status: DC | PRN
Start: 2014-02-17 — End: 2014-02-20

## 2014-02-17 MED ORDER — SODIUM CHLORIDE 0.9 % IJ SOLN: 3.0000 mL | INTRAMUSCULAR | Status: DC | PRN

## 2014-02-17 MED ORDER — OXYCODONE-ACETAMINOPHEN 5-325 MG PO TABS
1.0000 | ORAL_TABLET | ORAL | Status: DC | PRN
  Administered 2014-02-19 – 2014-02-20 (×2): 1 via ORAL
  Filled 2014-02-17 (×2): qty 1

## 2014-02-17 MED ORDER — POTASSIUM CHLORIDE CRYS ER 20 MEQ PO TBCR
40.0000 meq | EXTENDED_RELEASE_TABLET | Freq: Two times a day (BID) | ORAL | Status: DC
  Administered 2014-02-17 – 2014-02-18 (×2): 40 meq via ORAL
  Filled 2014-02-17 (×4): qty 2

## 2014-02-17 MED ORDER — NALBUPHINE HCL 10 MG/ML IJ SOLN: 5.0000 mg | INTRAMUSCULAR | Status: DC | PRN

## 2014-02-17 MED ORDER — FENTANYL CITRATE 0.05 MG/ML IJ SOLN
INTRAMUSCULAR | Status: DC | PRN
  Administered 2014-02-17: 12.5 ug via INTRATHECAL
  Administered 2014-02-17: 87.5 ug via INTRAVENOUS

## 2014-02-17 MED ORDER — PHENYLEPHRINE 8 MG IN D5W 100 ML (0.08MG/ML) PREMIX OPTIME
INJECTION | INTRAVENOUS | Status: AC
  Filled 2014-02-17: qty 100

## 2014-02-17 MED ORDER — MORPHINE SULFATE (PF) 0.5 MG/ML IJ SOLN
INTRAMUSCULAR | Status: DC | PRN
  Administered 2014-02-17: .25 mg via INTRATHECAL

## 2014-02-17 MED ORDER — SCOPOLAMINE 1 MG/3DAYS TD PT72
1.0000 | MEDICATED_PATCH | Freq: Once | TRANSDERMAL | Status: DC
  Filled 2014-02-17: qty 1

## 2014-02-17 MED ORDER — FENTANYL CITRATE 0.05 MG/ML IJ SOLN
INTRAMUSCULAR | Status: AC
  Administered 2014-02-17: 25 ug via INTRAVENOUS
  Filled 2014-02-17: qty 2

## 2014-02-17 MED ORDER — IBUPROFEN 600 MG PO TABS
600.0000 mg | ORAL_TABLET | Freq: Four times a day (QID) | ORAL | Status: DC
  Administered 2014-02-17 – 2014-02-21 (×14): 600 mg via ORAL
  Filled 2014-02-17 (×14): qty 1

## 2014-02-17 MED ORDER — ZOLPIDEM TARTRATE 5 MG PO TABS
5.0000 mg | ORAL_TABLET | Freq: Every evening | ORAL | Status: DC | PRN
  Administered 2014-02-19 (×2): 5 mg via ORAL
  Filled 2014-02-17 (×2): qty 1

## 2014-02-17 MED ORDER — DIPHENHYDRAMINE HCL 25 MG PO CAPS: 25.0000 mg | ORAL_CAPSULE | Freq: Four times a day (QID) | ORAL | Status: DC | PRN

## 2014-02-17 MED ORDER — WITCH HAZEL-GLYCERIN EX PADS: 1.0000 "application " | MEDICATED_PAD | CUTANEOUS | Status: DC | PRN

## 2014-02-17 MED ORDER — NALOXONE HCL 0.4 MG/ML IJ SOLN: 0.4000 mg | INTRAMUSCULAR | Status: DC | PRN

## 2014-02-17 MED ORDER — IBUPROFEN 600 MG PO TABS: 600.0000 mg | ORAL_TABLET | Freq: Four times a day (QID) | ORAL | Status: DC

## 2014-02-17 MED ORDER — ONDANSETRON HCL 4 MG PO TABS: 4.0000 mg | ORAL_TABLET | ORAL | Status: DC | PRN

## 2014-02-17 MED ORDER — BUPIVACAINE IN DEXTROSE 0.75-8.25 % IT SOLN
INTRATHECAL | Status: DC | PRN
  Administered 2014-02-17: 1.6 mL via INTRATHECAL

## 2014-02-17 MED ORDER — LANOLIN HYDROUS EX OINT: 1.0000 "application " | TOPICAL_OINTMENT | CUTANEOUS | Status: DC | PRN

## 2014-02-17 MED ORDER — PHENYLEPHRINE 8 MG IN D5W 100 ML (0.08MG/ML) PREMIX OPTIME
INJECTION | INTRAVENOUS | Status: DC | PRN
  Administered 2014-02-17: 60 ug/min via INTRAVENOUS

## 2014-02-17 MED ORDER — ONDANSETRON HCL 4 MG/2ML IJ SOLN
INTRAMUSCULAR | Status: DC | PRN
  Administered 2014-02-17: 4 mg via INTRAVENOUS

## 2014-02-17 MED ORDER — SCOPOLAMINE 1 MG/3DAYS TD PT72
MEDICATED_PATCH | TRANSDERMAL | Status: DC | PRN
  Administered 2014-02-17: 1 via TRANSDERMAL

## 2014-02-17 MED ORDER — OXYTOCIN 10 UNIT/ML IJ SOLN
40.0000 [IU] | INTRAVENOUS | Status: DC | PRN
  Administered 2014-02-17: 40 [IU] via INTRAVENOUS

## 2014-02-17 MED ORDER — BUPIVACAINE HCL (PF) 0.5 % IJ SOLN
INTRAMUSCULAR | Status: AC
  Filled 2014-02-17: qty 30

## 2014-02-17 MED ORDER — MORPHINE SULFATE 0.5 MG/ML IJ SOLN
INTRAMUSCULAR | Status: AC
  Filled 2014-02-17: qty 10

## 2014-02-17 MED ORDER — TETANUS-DIPHTH-ACELL PERTUSSIS 5-2.5-18.5 LF-MCG/0.5 IM SUSP
0.5000 mL | Freq: Once | INTRAMUSCULAR | Status: DC
  Filled 2014-02-17: qty 0.5

## 2014-02-17 MED ORDER — CITRIC ACID-SODIUM CITRATE 334-500 MG/5ML PO SOLN
30.0000 mL | ORAL | Status: DC | PRN
  Administered 2014-02-17: 30 mL via ORAL
  Filled 2014-02-17 (×2): qty 15

## 2014-02-17 MED ORDER — PRENATAL MULTIVITAMIN CH
1.0000 | ORAL_TABLET | Freq: Every day | ORAL | Status: DC
  Administered 2014-02-18 – 2014-02-20 (×3): 1 via ORAL
  Filled 2014-02-17 (×3): qty 1

## 2014-02-17 MED ORDER — MENTHOL 3 MG MT LOZG: 1.0000 | LOZENGE | OROMUCOSAL | Status: DC | PRN

## 2014-02-17 MED ORDER — ONDANSETRON HCL 4 MG/2ML IJ SOLN: 4.0000 mg | Freq: Three times a day (TID) | INTRAMUSCULAR | Status: DC | PRN

## 2014-02-17 MED ORDER — NALOXONE HCL 1 MG/ML IJ SOLN
1.0000 ug/kg/h | INTRAVENOUS | Status: DC | PRN
  Filled 2014-02-17: qty 2

## 2014-02-17 MED ORDER — SENNOSIDES-DOCUSATE SODIUM 8.6-50 MG PO TABS
2.0000 | ORAL_TABLET | ORAL | Status: DC
  Administered 2014-02-17 – 2014-02-20 (×3): 2 via ORAL
  Filled 2014-02-17 (×4): qty 2

## 2014-02-17 MED ORDER — DIBUCAINE 1 % RE OINT: 1.0000 "application " | TOPICAL_OINTMENT | RECTAL | Status: DC | PRN

## 2014-02-17 MED ORDER — OXYTOCIN 10 UNIT/ML IJ SOLN
INTRAMUSCULAR | Status: AC
  Filled 2014-02-17: qty 4

## 2014-02-17 MED ORDER — SIMETHICONE 80 MG PO CHEW
80.0000 mg | CHEWABLE_TABLET | ORAL | Status: DC | PRN
  Administered 2014-02-19 (×2): 80 mg via ORAL
  Filled 2014-02-17: qty 1

## 2014-02-17 SURGICAL SUPPLY — 36 items
CLAMP CORD UMBIL (MISCELLANEOUS) IMPLANT
CLOSURE WOUND 1/2 X4 (GAUZE/BANDAGES/DRESSINGS) ×1
CLOTH BEACON ORANGE TIMEOUT ST (SAFETY) ×3 IMPLANT
DRAPE SHEET LG 3/4 BI-LAMINATE (DRAPES) IMPLANT
DRSG OPSITE POSTOP 4X10 (GAUZE/BANDAGES/DRESSINGS) ×3 IMPLANT
DRSG TELFA 3X8 NADH (GAUZE/BANDAGES/DRESSINGS) ×6 IMPLANT
DURAPREP 26ML APPLICATOR (WOUND CARE) ×3 IMPLANT
ELECT REM PT RETURN 9FT ADLT (ELECTROSURGICAL) ×3
ELECTRODE REM PT RTRN 9FT ADLT (ELECTROSURGICAL) ×1 IMPLANT
EXTRACTOR VACUUM M CUP 4 TUBE (SUCTIONS) IMPLANT
EXTRACTOR VACUUM M CUP 4' TUBE (SUCTIONS)
GLOVE BIOGEL PI IND STRL 7.0 (GLOVE) ×1 IMPLANT
GLOVE BIOGEL PI INDICATOR 7.0 (GLOVE) ×2
GLOVE ECLIPSE 7.0 STRL STRAW (GLOVE) ×3 IMPLANT
GOWN STRL REUS W/TWL LRG LVL3 (GOWN DISPOSABLE) ×6 IMPLANT
KIT ABG SYR 3ML LUER SLIP (SYRINGE) IMPLANT
NEEDLE HYPO 22GX1.5 SAFETY (NEEDLE) ×3 IMPLANT
NEEDLE HYPO 25X5/8 SAFETYGLIDE (NEEDLE) ×3 IMPLANT
NS IRRIG 1000ML POUR BTL (IV SOLUTION) ×3 IMPLANT
PACK C SECTION WH (CUSTOM PROCEDURE TRAY) ×3 IMPLANT
PAD ABD 7.5X8 STRL (GAUZE/BANDAGES/DRESSINGS) ×3 IMPLANT
PAD OB MATERNITY 4.3X12.25 (PERSONAL CARE ITEMS) ×3 IMPLANT
RTRCTR C-SECT PINK 25CM LRG (MISCELLANEOUS) IMPLANT
STAPLER VISISTAT 35W (STAPLE) IMPLANT
STRIP CLOSURE SKIN 1/2X4 (GAUZE/BANDAGES/DRESSINGS) ×2 IMPLANT
SUT PDS AB 0 CT1 27 (SUTURE) IMPLANT
SUT PDS AB 0 CTX 36 PDP370T (SUTURE) IMPLANT
SUT PLAIN 2 0 XLH (SUTURE) IMPLANT
SUT VIC AB 0 CT1 36 (SUTURE) ×6 IMPLANT
SUT VIC AB 0 CTX 36 (SUTURE) ×4
SUT VIC AB 0 CTX36XBRD ANBCTRL (SUTURE) ×2 IMPLANT
SUT VIC AB 4-0 KS 27 (SUTURE) ×6 IMPLANT
SYR CONTROL 10ML LL (SYRINGE) ×3 IMPLANT
TOWEL OR 17X24 6PK STRL BLUE (TOWEL DISPOSABLE) ×3 IMPLANT
TRAY FOLEY CATH 14FR (SET/KITS/TRAYS/PACK) ×3 IMPLANT
WATER STERILE IRR 1000ML POUR (IV SOLUTION) ×3 IMPLANT

## 2014-02-17 NOTE — Progress Notes (Signed)
Dr Alycia RossettiKoch updated on critical Mag level of 7.1. No new orders received.  Continue current POC.

## 2014-02-17 NOTE — Progress Notes (Signed)
UR chart review completed.  

## 2014-02-17 NOTE — Anesthesia Preprocedure Evaluation (Signed)
Anesthesia Evaluation  Patient identified by MRN, date of birth, ID band Patient awake    Reviewed: Allergy & Precautions, H&P , NPO status , Patient's Chart, lab work & pertinent test results, reviewed documented beta blocker date and time   Airway Mallampati: II TM Distance: >3 FB Neck ROM: Full    Dental no notable dental hx. (+) Teeth Intact   Pulmonary neg pulmonary ROS,  breath sounds clear to auscultation  Pulmonary exam normal       Cardiovascular hypertension, Pt. on medications and Pt. on home beta blockers Rhythm:Regular Rate:Normal     Neuro/Psych  Neuromuscular disease negative psych ROS   GI/Hepatic negative GI ROS, Neg liver ROS, GERD-  Medicated,  Endo/Other  negative endocrine ROS  Renal/GU negative Renal ROS  negative genitourinary   Musculoskeletal negative musculoskeletal ROS (+)   Abdominal   Peds  Hematology  (+) anemia ,   Anesthesia Other Findings   Reproductive/Obstetrics (+) Pregnancy Pre eclampsia Non reassuring FHR tracing [redacted] weeks gestation                           Anesthesia Physical Anesthesia Plan  ASA: III and emergent  Anesthesia Plan: Spinal   Post-op Pain Management:    Induction:   Airway Management Planned: Natural Airway  Additional Equipment:   Intra-op Plan:   Post-operative Plan:   Informed Consent: I have reviewed the patients History and Physical, chart, labs and discussed the procedure including the risks, benefits and alternatives for the proposed anesthesia with the patient or authorized representative who has indicated his/her understanding and acceptance.     Plan Discussed with: CRNA, Anesthesiologist and Surgeon  Anesthesia Plan Comments:         Anesthesia Quick Evaluation

## 2014-02-17 NOTE — Progress Notes (Signed)
Pt in bathroom attempting to have a BM.  On phone.  Encourage pt to get off toilet so this RN can place her back on the monitor to assess fetal status.

## 2014-02-17 NOTE — Op Note (Signed)
[name] Young PROCEDURE DATE: 02/17/2014  PREOPERATIVE DIAGNOSES: Intrauterine pregnancy at 1026w2d weeks gestation; non-reassuring fetal status and severe preeclampsia  POSTOPERATIVE DIAGNOSES: The same  PROCEDURE: Primary Low Transverse Cesarean Section  SURGEON:  Dr. Jaynie CollinsUgonna Clarity Ciszek  ASSISTANT:  Dr. Fredirick LatheKristy Acosta  ANESTHESIOLOGIST: Dr. Leilani AbleFranklin Hatchett  INDICATIONS: [name] Young is a 23 y.o. G1P0100 at 4726w2d here for cesarean section secondary to the indications listed under preoperative diagnoses; please see preoperative note for further details.  The risks of cesarean section were discussed with the patient including but were not limited to: bleeding which may require transfusion or reoperation; infection which may require antibiotics; injury to bowel, bladder, ureters or other surrounding organs; injury to the fetus; need for additional procedures including hysterectomy in the event of a life-threatening hemorrhage; placental abnormalities wth subsequent pregnancies, incisional problems, thromboembolic phenomenon and other postoperative/anesthesia complications.   The patient concurred with the proposed plan, giving informed written consent for the procedure.    FINDINGS:  Viable female infant in cephalic presentation.  Apgars 2/5/7, weight 1300 g. Arterial cord pH 7.33.  Clear amniotic fluid.  Intact placenta, three vessel cord.  Normal uterus, fallopian tubes and ovaries bilaterally.  ANESTHESIA: Spinal INTRAVENOUS FLUIDS: 1200 ml ESTIMATED BLOOD LOSS: 600 ml URINE OUTPUT:  100 ml SPECIMENS: Placenta sent to pathology COMPLICATIONS: None immediate  PROCEDURE IN DETAIL:  The patient preoperatively received intravenous antibiotics and had sequential compression devices applied to her lower extremities.  She was then taken to the operating room where spinal anesthesia was administered and was found to be adequate. She was then placed in a dorsal supine position with a leftward tilt, and  prepped and draped in a sterile manner.  A foley catheter was placed into her bladder and attached to constant gravity.  After an adequate timeout was performed, a Pfannenstiel skin incision was made with scalpel and carried through to the underlying layer of fascia. The fascia was incised in the midline, and this incision was extended bilaterally using the Mayo scissors.  Kocher clamps were applied to the superior aspect of the fascial incision and the underlying rectus muscles were dissected off bluntly. A similar process was carried out on the inferior aspect of the fascial incision. The rectus muscles were separated in the midline bluntly and the peritoneum was entered bluntly. Attention was turned to the lower uterine segment where a low transverse hysterotomy was made with a scalpel and extended bilaterally bluntly.  The infant was successfully delivered, the cord was clamped and cut and the infant was handed over to awaiting neonatology team. Uterine massage was then administered, and the placenta delivered intact with a three-vessel cord. The uterus was then cleared of clot and debris.  The hysterotomy was closed with 0 Vicryl in a running locked fashion, and an imbricating layer was also placed with 0 Vicryl. A few figure of eight stitches were placed on the serosal layer for hemostasis. The pelvis was cleared of all clot and debris. Hemostasis was confirmed on all surfaces.  The peritoneum and the muscles were reapproximated using 0 Vicryl interrupted stitches. The fascia was then closed using 0 Vicryl in a running fashion.  The subcutaneous layer was irrigated, and 30 ml of 0.5% Marcaine was injected subcutaneously around the incision.  The skin was closed with a 4-0 Vicryl subcuticular stitch. The patient tolerated the procedure well. Sponge, lap, instrument and needle counts were correct x 2.  She was taken to the recovery room in stable condition.    [name] Young  [name] LargeANYANWU, MD, FACOG Attending  Obstetrician & Gynecologist Faculty Practice, [facility] [place]

## 2014-02-17 NOTE — Progress Notes (Signed)
LABOR PROGRESS NOTE  [name] Young is a 23 y.o. G1P0000 at 3224w1d  admitted for induction of labor due to severe pre-eclampsia with worsening renal function.  Subjective: No complaints. Mild cramping. Denies headache, scotoma, chest pain, shortness of breath, RUQ abdominal pain, edema or other concerning symptoms.   Objective: BP 130/83  Pulse 95  Temp(Src) 98.3 F (36.8 C) (Oral)  Resp 20  Ht 5\' 3"  (1.6 m)  Wt 140 lb (63.504 kg)  BMI 24.81 kg/m2  SpO2 98%  LMP 06/29/2013 or  Filed Vitals:   02/16/14 1950 02/16/14 2107 02/16/14 2217 02/16/14 2328  BP: 153/98 165/101 146/84 130/83  Pulse: 92 91 91 95  Temp: 98.1 F (36.7 C)   98.3 F (36.8 C)  TempSrc: Oral   Oral  Resp: 20 20 18 20   Height:      Weight:      SpO2:        Total I/O In: 620 [P.O.:120; I.V.:500] Out: 275 [Urine:275]  FHT:  FHR: 140 bpm, variability: moderate,  accelerations:  Abscent,  decelerations:  Absent UC:   Rare SVE:   Dilation: Fingertip Effacement (%): Thick Station: -3 Exam by:: h stone rnc  Dilation: Fingertip Effacement (%): Thick Cervical Position: Posterior Station: -3 Presentation: Vertex Exam by:: h stone rnc  Labs: Lab Results  Component Value Date   WBC 10.7* 02/15/2014   HGB 10.7* 02/15/2014   HCT 32.1* 02/15/2014   MCV 82.1 02/15/2014   PLT 250 02/15/2014   PLT 241 02/15/2014    Assessment / Plan: Induction of labor due to preeclampsia  Labor: Staged induction of labor. Cytotec x3. Cervix softening, but minimal change otherwise. Foley bulb placed 0000.  Severe Preeclamsia with worsening creatinine: Blood pressure stable. Continue home labetalol. Continue magnesium. Continue to monitor blood pressures, urine output. Fetal Wellbeing:  Category I and Category II Pain Control:  Labor support without medications. Considering epidural.  Anticipated MOD:  NSVD  William DaltonMcEachern, [name] Votaw, MD 02/17/2014, 12:14 AM

## 2014-02-17 NOTE — Progress Notes (Signed)
CRITICAL VALUE ALERT  Critical value received:  Magnesium 7.1  Date of notification:  02/17/14  Time of notification:  1028  Critical value read back:Yes.    Nurse who received alert:  Ferne CoeS Jadalyn Oliveri [facility]  MD notified (1st page):  1028  Time of first page: 1028  MD notified (2nd page):  Time of second page:  Responding MD:  Alycia RossettiKoch  Time MD responded:  1 min

## 2014-02-17 NOTE — Progress Notes (Signed)
LABOR PROGRESS NOTE  [name] Young is Young 23 y.o. G1P0000 at 3316w2d  admitted for induction of labor due to severe pre-eclampsia with worsening renal function.  Subjective: No complaints. Mild cramping. Denies headache, scotoma, chest pain, shortness of breath, RUQ abdominal pain, edema or other concerning symptoms.   Objective: BP 138/97  Pulse 81  Temp(Src) 98.7 F (37.1 C) (Oral)  Resp 18  Ht 5\' 3"  (1.6 m)  Wt 140 lb (63.504 kg)  BMI 24.81 kg/m2  SpO2 100%  LMP 06/29/2013 or  Filed Vitals:   02/17/14 0906 02/17/14 1000 02/17/14 1100 02/17/14 1200  BP: 153/127 148/100 138/97   Pulse: 97 84 81   Temp:      TempSrc:      Resp: 20 20 20 18   Height:      Weight:      SpO2:        Total I/O In: 615 [P.O.:240; I.V.:375] Out: 350 [Urine:350]  FHT:  FHR: 140 bpm, variability: minimal,  accelerations:  absent,  decelerations:  absent UC:   Rare  Dilation: Fingertip Effacement (%): Thick Cervical Position: Posterior Station: -3 Presentation: Vertex Exam by:: h stone rnc  Labs: Results for orders placed during the hospital encounter of 02/15/14 (from the past 24 hour(s))  MRSA PCR SCREENING     Status: None   Collection Time    02/16/14 12:35 PM      Result Value Ref Range   MRSA by PCR NEGATIVE  NEGATIVE  GROUP B STREP BY PCR     Status: None   Collection Time    02/16/14 12:35 PM      Result Value Ref Range   Group B strep by PCR NEGATIVE  NEGATIVE  CBC     Status: Abnormal   Collection Time    02/17/14  5:30 AM      Result Value Ref Range   WBC 16.3 (*) 4.0 - 10.5 K/uL   RBC 3.73 (*) 3.87 - 5.11 MIL/uL   Hemoglobin 10.4 (*) 12.0 - 15.0 g/dL   HCT 13.031.1 (*) 86.536.0 - 78.446.0 %   MCV 83.4  78.0 - 100.0 fL   MCH 27.9  26.0 - 34.0 pg   MCHC 33.4  30.0 - 36.0 g/dL   RDW 69.614.3  29.511.5 - 28.415.5 %   Platelets 315  150 - 400 K/uL  COMPREHENSIVE METABOLIC PANEL     Status: Abnormal   Collection Time    02/17/14  5:30 AM      Result Value Ref Range   Sodium 135 (*) 137 - 147  mEq/L   Potassium 4.9  3.7 - 5.3 mEq/L   Chloride 105  96 - 112 mEq/L   CO2 19  19 - 32 mEq/L   Glucose, Bld 103 (*) 70 - 99 mg/dL   BUN 23  6 - 23 mg/dL   Creatinine, Ser 1.321.14 (*) 0.50 - 1.10 mg/dL   Calcium 7.6 (*) 8.4 - 10.5 mg/dL   Total Protein 5.0 (*) 6.0 - 8.3 g/dL   Albumin 1.8 (*) 3.5 - 5.2 g/dL   AST 22  0 - 37 U/L   ALT 14  0 - 35 U/L   Alkaline Phosphatase 103  39 - 117 U/L   Total Bilirubin <0.2 (*) 0.3 - 1.2 mg/dL   GFR calc non Af Amer 68 (*) >90 mL/min   GFR calc Af Amer 79 (*) >90 mL/min   Anion gap 11  5 - 15  MAGNESIUM  Status: Abnormal   Collection Time    02/17/14  5:30 AM      Result Value Ref Range   Magnesium 7.1 (*) 1.5 - 2.5 mg/dL    Assessment / Plan: Induction of labor due to severe preeclampsia at 6924w2d with elevated Cr.  Patient has had minimal variability for several hours, nonreassuring overall FHR rhythm, and very remote from vagina delivery. Recommended cesarean delivery with patient, FOB and her mother.  The risks of cesarean section discussed with the patient included but were not limited to: bleeding which may require transfusion or reoperation; infection which may require antibiotics; injury to bowel, bladder, ureters or other surrounding organs; injury to the fetus; need for additional procedures including hysterectomy in the event of Young life-threatening hemorrhage; placental abnormalities wth subsequent pregnancies, incisional problems, thromboembolic phenomenon and other postoperative/anesthesia complications. The patient concurred with the proposed plan, giving informed written consent for the procedure.   Patient has been NPO since last night she will remain NPO for procedure. Anesthesia and OR aware. Preoperative prophylactic antibiotics and SCDs ordered on call to the OR.  To OR when ready.   Tereso NewcomerANYANWU,[name] Everett A, MD 02/17/2014, 12:32 PM

## 2014-02-17 NOTE — Anesthesia Procedure Notes (Signed)
Spinal  Patient location during procedure: OR Start time: 02/17/2014 1:15 PM End time: 02/17/2014 1:20 PM Preanesthetic Checklist Completed: patient identified, surgical consent, pre-op evaluation, timeout performed, IV checked, risks and benefits discussed and monitors and equipment checked Spinal Block Patient position: sitting Prep: DuraPrep Patient monitoring: heart rate, cardiac monitor, continuous pulse ox and blood pressure Approach: midline Location: L3-4 Injection technique: single-shot Needle Needle type: Pencan  Needle gauge: 24 G Needle length: 9 cm Assessment Sensory level: T4

## 2014-02-17 NOTE — Progress Notes (Signed)
In O.R. 

## 2014-02-17 NOTE — Progress Notes (Signed)
POC for urgent C/S initiated.  Consent obtained.

## 2014-02-17 NOTE — Transfer of Care (Addendum)
Immediate Anesthesia Transfer of Care Note  Patient: [name] Young  Procedure(s) Performed: Procedure(s): CESAREAN SECTION (N/A)  Patient Location: PACU  Anesthesia Type:Spinal  Level of Consciousness: awake, alert , oriented and patient cooperative  Airway & Oxygen Therapy: Patient Spontanous Breathing  Post-op Assessment: Report given to PACU RN and Post -op Vital signs reviewed and stable  Post vital signs: Reviewed and stable  Complications: No apparent anesthesia complications

## 2014-02-17 NOTE — Anesthesia Postprocedure Evaluation (Signed)
Anesthesia Post Note  Patient: [name] Young  Procedure(s) Performed: Procedure(s) (LRB): CESAREAN SECTION (N/A)  Anesthesia type: Spinal  Patient location: PACU  Post pain: Pain level controlled  Post assessment: Post-op Vital signs reviewed  Last Vitals:  Filed Vitals:   02/17/14 1449  BP: 117/78  Pulse: 71  Temp: 36.4 C  Resp: 17    Post vital signs: Reviewed  Level of consciousness: awake  Complications: No apparent anesthesia complications

## 2014-02-18 ENCOUNTER — Encounter (HOSPITAL_COMMUNITY): Payer: Self-pay | Admitting: Obstetrics & Gynecology

## 2014-02-18 LAB — CBC
HEMATOCRIT: 30.6 % — AB (ref 36.0–46.0)
HEMOGLOBIN: 10 g/dL — AB (ref 12.0–15.0)
MCH: 27.4 pg (ref 26.0–34.0)
MCHC: 32.7 g/dL (ref 30.0–36.0)
MCV: 83.8 fL (ref 78.0–100.0)
Platelets: 345 10*3/uL (ref 150–400)
RBC: 3.65 MIL/uL — AB (ref 3.87–5.11)
RDW: 14.2 % (ref 11.5–15.5)
WBC: 16.8 10*3/uL — AB (ref 4.0–10.5)

## 2014-02-18 LAB — COMPREHENSIVE METABOLIC PANEL
ALBUMIN: 2 g/dL — AB (ref 3.5–5.2)
ALK PHOS: 103 U/L (ref 39–117)
ALT: 16 U/L (ref 0–35)
ANION GAP: 12 (ref 5–15)
AST: 23 U/L (ref 0–37)
BUN: 26 mg/dL — ABNORMAL HIGH (ref 6–23)
CHLORIDE: 101 meq/L (ref 96–112)
CO2: 20 mEq/L (ref 19–32)
Calcium: 7.5 mg/dL — ABNORMAL LOW (ref 8.4–10.5)
Creatinine, Ser: 1.21 mg/dL — ABNORMAL HIGH (ref 0.50–1.10)
GFR calc Af Amer: 74 mL/min — ABNORMAL LOW (ref >90)
GFR calc non Af Amer: 63 mL/min — ABNORMAL LOW (ref >90)
GLUCOSE: 122 mg/dL — AB (ref 70–99)
POTASSIUM: 5.5 meq/L — AB (ref 3.7–5.3)
SODIUM: 133 meq/L — AB (ref 137–147)
Total Protein: 5.1 g/dL — ABNORMAL LOW (ref 6.0–8.3)

## 2014-02-18 LAB — MAGNESIUM
MAGNESIUM: 7.1 mg/dL — AB (ref 1.5–2.5)
Magnesium: 7.7 mg/dL (ref 1.5–2.5)

## 2014-02-18 MED ORDER — LACTATED RINGERS IV SOLN
INTRAVENOUS | Status: DC
  Administered 2014-02-18: 20 mL via INTRAVENOUS

## 2014-02-18 MED ORDER — FUROSEMIDE 10 MG/ML IJ SOLN: 40.0000 mg | Freq: Four times a day (QID) | INTRAMUSCULAR | Status: DC

## 2014-02-18 MED ORDER — FUROSEMIDE 10 MG/ML IJ SOLN
40.0000 mg | Freq: Four times a day (QID) | INTRAMUSCULAR | Status: AC
  Administered 2014-02-18 – 2014-02-19 (×2): 40 mg via INTRAVENOUS
  Filled 2014-02-18 (×2): qty 4

## 2014-02-18 MED ORDER — AMLODIPINE BESYLATE 10 MG PO TABS
10.0000 mg | ORAL_TABLET | Freq: Every day | ORAL | Status: DC
  Administered 2014-02-18 – 2014-02-20 (×3): 10 mg via ORAL
  Filled 2014-02-18 (×6): qty 1

## 2014-02-18 NOTE — Progress Notes (Signed)
Subjective: Postpartum Day 1: Cesarean Delivery at 33w2 for [facility] in setting of severe preeclampsia and worsening renal function  Patient reports incisional pain, tolerating PO and + flatus.  Denies headache, scotoma, chest pain, shortness of breath, RUQ abdominal pain, edema or other concerning symptoms.  Required one dose of hydralazine overnight. Baby is doing well in NICU.  Patient has been requiring arterial sticks to obtain blood given 3-4+ extremity edema.  Stable CBC last night, checked 2/2 increased incisional serosanguinous drainage.  Objective: Vital signs in last 24 hours: Temp:  [97.5 F (36.4 C)-98.7 F (37.1 C)] 98.1 F (36.7 C) (10/21 0404) Pulse Rate:  [58-97] 74 (10/21 0515) Resp:  [14-20] 16 (10/21 0515) BP: (117-156)/(67-127) 125/75 mmHg (10/21 0515) SpO2:  [95 %-100 %] 100 % (10/21 0515) Weight:  [194 lb 14.4 oz (88.406 kg)] 194 lb 14.4 oz (88.406 kg) (10/20 1651) 10/20 0701 - 10/21 0700 In: 5070.1 [P.O.:1420; I.V.:3650.1] Out: 3100 [Urine:2500; Blood:600]  Physical Exam:  General: alert and no distress Lungs: CTAB Heart: RRR Lochia: appropriate Uterine Fundus: firm Incision: slight clear drainage present on dressing, present bowel sounds, mild distention DVT Evaluation: No evidence of DVT seen on physical exam. Negative Homan's sign.  Results for orders placed during the hospital encounter of 02/15/14 (from the past 48 hour(s))  MRSA PCR SCREENING     Status: None   Collection Time    02/16/14 12:35 PM      Result Value Ref Range   MRSA by PCR NEGATIVE  NEGATIVE   Comment:            The GeneXpert MRSA Assay (FDA     approved for NASAL specimens     only), is one component of a     comprehensive MRSA colonization     surveillance program. It is not     intended to diagnose MRSA     infection nor to guide or     monitor treatment for     MRSA infections.  GROUP B STREP BY PCR     Status: None   Collection Time    02/16/14 12:35 PM      Result  Value Ref Range   Group B strep by PCR NEGATIVE  NEGATIVE  CBC     Status: Abnormal   Collection Time    02/17/14  5:30 AM      Result Value Ref Range   WBC 16.3 (*) 4.0 - 10.5 K/uL   RBC 3.73 (*) 3.87 - 5.11 MIL/uL   Hemoglobin 10.4 (*) 12.0 - 15.0 g/dL   HCT 31.1 (*) 36.0 - 46.0 %   MCV 83.4  78.0 - 100.0 fL   MCH 27.9  26.0 - 34.0 pg   MCHC 33.4  30.0 - 36.0 g/dL   RDW 14.3  11.5 - 15.5 %   Platelets 315  150 - 400 K/uL   Comment: DELTA CHECK NOTED     SPECIMEN CHECKED FOR CLOTS     REPEATED TO VERIFY  COMPREHENSIVE METABOLIC PANEL     Status: Abnormal   Collection Time    02/17/14  5:30 AM      Result Value Ref Range   Sodium 135 (*) 137 - 147 mEq/L   Potassium 4.9  3.7 - 5.3 mEq/L   Comment: DELTA CHECK NOTED     REPEATED TO VERIFY     SLIGHT HEMOLYSIS   Chloride 105  96 - 112 mEq/L   CO2 19  19 - 32 mEq/L  Glucose, Bld 103 (*) 70 - 99 mg/dL   BUN 23  6 - 23 mg/dL   Creatinine, Ser 1.14 (*) 0.50 - 1.10 mg/dL   Calcium 7.6 (*) 8.4 - 10.5 mg/dL   Total Protein 5.0 (*) 6.0 - 8.3 g/dL   Albumin 1.8 (*) 3.5 - 5.2 g/dL   AST 22  0 - 37 U/L   ALT 14  0 - 35 U/L   Alkaline Phosphatase 103  39 - 117 U/L   Total Bilirubin <0.2 (*) 0.3 - 1.2 mg/dL   GFR calc non Af Amer 68 (*) >90 mL/min   GFR calc Af Amer 79 (*) >90 mL/min   Comment: (NOTE)     The eGFR has been calculated using the CKD EPI equation.     This calculation has not been validated in all clinical situations.     eGFR's persistently <90 mL/min signify possible Chronic Kidney     Disease.   Anion gap 11  5 - 15  MAGNESIUM     Status: Abnormal   Collection Time    02/17/14  5:30 AM      Result Value Ref Range   Magnesium 7.1 (*) 1.5 - 2.5 mg/dL   Comment: CRITICAL RESULT CALLED TO, READ BACK BY AND VERIFIED WITH:     EARL,S. _0  ON 02/17/2014 BY BOVELL,A.  CBC     Status: Abnormal   Collection Time    02/17/14 11:55 PM      Result Value Ref Range   WBC 16.8 (*) 4.0 - 10.5 K/uL   RBC 3.65 (*) 3.87 -  5.11 MIL/uL   Hemoglobin 10.0 (*) 12.0 - 15.0 g/dL   HCT 30.6 (*) 36.0 - 46.0 %   MCV 83.8  78.0 - 100.0 fL   MCH 27.4  26.0 - 34.0 pg   MCHC 32.7  30.0 - 36.0 g/dL   RDW 14.2  11.5 - 15.5 %   Platelets 345  150 - 400 K/uL  COMPREHENSIVE METABOLIC PANEL     Status: Abnormal   Collection Time    02/17/14 11:55 PM      Result Value Ref Range   Sodium 133 (*) 137 - 147 mEq/L   Potassium 5.5 (*) 3.7 - 5.3 mEq/L   Chloride 101  96 - 112 mEq/L   CO2 20  19 - 32 mEq/L   Glucose, Bld 122 (*) 70 - 99 mg/dL   BUN 26 (*) 6 - 23 mg/dL   Creatinine, Ser 1.21 (*) 0.50 - 1.10 mg/dL   Calcium 7.5 (*) 8.4 - 10.5 mg/dL   Total Protein 5.1 (*) 6.0 - 8.3 g/dL   Albumin 2.0 (*) 3.5 - 5.2 g/dL   AST 23  0 - 37 U/L   ALT 16  0 - 35 U/L   Alkaline Phosphatase 103  39 - 117 U/L   Total Bilirubin <0.2 (*) 0.3 - 1.2 mg/dL   GFR calc non Af Amer 63 (*) >90 mL/min   GFR calc Af Amer 74 (*) >90 mL/min   Comment: (NOTE)     The eGFR has been calculated using the CKD EPI equation.     This calculation has not been validated in all clinical situations.     eGFR's persistently <90 mL/min signify possible Chronic Kidney     Disease.   Anion gap 12  5 - 15  MAGNESIUM     Status: Abnormal   Collection Time    02/17/14 11:55 PM  Result Value Ref Range   Magnesium 7.7 (*) 1.5 - 2.5 mg/dL   Comment: RESULT CONFIRMED BY AUTOMATED DILUTION     CRITICAL RESULT CALLED TO, READ BACK BY AND VERIFIED WITH:     B KEYS 02/18/14 AT 0045 BY H SOEWARDIMAN    Assessment/Plan: Status post Cesarean section. Doing well postoperatively.  Continue magnesium sulfate x 24 hours postpartum and antihypertensives as needed. Continue Labetalol 400 mg po bid for now; IV hydralazine as needed. May change to Norvasc after magnesium sulfate infusion Follow up labs today. Replete lytes as needed. Bottlefeeding, undecided about BCM. Continue current care.   Osborne Oman, MD 02/18/2014, 6:28 AM

## 2014-02-18 NOTE — Lactation Note (Signed)
This note was copied from the chart of [name] Young. Lactation Consultation Note  Initial visit made per request of RN.  Mom has planned to formula feed but now that baby is preterm she is considering providing breastmilk for her baby.  She states she has heard that it is healthier for baby.  Discussed importance of breastmilk especially with a preterm baby.  Discussed pumping schedule, supply and demand and [facility]  Mom states she has nipple piercing and asking if this would cause a problem.  I informed mom that piercings would need to be removed but most likely will not cause problems.  I offered to set up pump and assist with initiating.  Mom states she needs some time to think about decision.  Instructed to let nurse know if she would like to start pumping and she can assist her.  I will follow up tomorrow.   Patient Name: [name] Allie DimmerDymond Mercado ZOXWR'UToday's Date: 02/18/2014     Maternal Data    Feeding    LATCH Score/Interventions                      Lactation Tools Discussed/Used     Consult Status      Huston FoleyMOULDEN, Kourtlyn Charlet S 02/18/2014, 3:20 PM

## 2014-02-18 NOTE — Anesthesia Postprocedure Evaluation (Signed)
  Anesthesia Post-op Note  Patient: [name] Young  Procedure(s) Performed: Procedure(s): CESAREAN SECTION (N/A)  Patient Location: PACU and Women's Unit  Anesthesia Type:Spinal  Level of Consciousness: awake, alert  and oriented  Airway and Oxygen Therapy: Patient Spontanous Breathing  Post-op Pain: mild  Post-op Assessment: Post-op Vital signs reviewed, Patient's Cardiovascular Status Stable, Respiratory Function Stable, No signs of Nausea or vomiting, Adequate PO intake and Pain level controlled  Post-op Vital Signs: Reviewed and stable  Last Vitals:  Filed Vitals:   02/18/14 0800  BP: 123/92  Pulse: 78  Temp:   Resp: 16    Complications: No apparent anesthesia complications

## 2014-02-18 NOTE — Progress Notes (Signed)
Results for [name] Young, [name] Young (MRN [id number]) as of 02/18/2014 00:56  Ref. Range 02/17/2014 23:55  Sodium Latest Range: 137-147 mEq/L 133 (L)  Potassium Latest Range: 3.7-5.3 mEq/L 5.5 (H)  Chloride Latest Range: 96-112 mEq/L 101  CO2 Latest Range: 19-32 mEq/L 20  BUN Latest Range: 6-23 mg/dL 26 (H)  Creatinine Latest Range: 0.50-1.10 mg/dL 9.621.21 (H)  Calcium Latest Range: 8.4-10.5 mg/dL 7.5 (L)  GFR calc non Af Amer Latest Range: >90 mL/min 63 (L)  GFR calc Af Amer Latest Range: >90 mL/min 74 (L)  Glucose Latest Range: 70-99 mg/dL 952122 (H)  Anion gap Latest Range: 5-15  12  Magnesium Latest Range: 1.5-2.5 mg/dL 7.7 (HH)  Alkaline Phosphatase Latest Range: 39-117 U/L 103  Albumin Latest Range: 3.5-5.2 g/dL 2.0 (L)  AST Latest Range: 0-37 U/L 23  ALT Latest Range: 0-35 U/L 16  Total Protein Latest Range: 6.0-8.3 g/dL 5.1 (L)  Total Bilirubin Latest Range: 0.3-1.2 mg/dL <8.4<0.2 (L)  WBC Latest Range: 4.0-10.5 K/uL 16.8 (H)  RBC Latest Range: 3.87-5.11 MIL/uL 3.65 (L)  Hemoglobin Latest Range: 12.0-15.0 g/dL 13.210.0 (L)  HCT Latest Range: 36.0-46.0 % 30.6 (L)  MCV Latest Range: 78.0-100.0 fL 83.8  MCH Latest Range: 26.0-34.0 pg 27.4  MCHC Latest Range: 30.0-36.0 g/dL 44.032.7  RDW Latest Range: 11.5-15.5 % 14.2  Platelets Latest Range: 150-400 K/uL 345  Report to Dr. [name] re critical value - magnesium sulfate level 7.8

## 2014-02-18 NOTE — Addendum Note (Signed)
Addendum created 02/18/14 0905 by Elbert Ewingsolleen S Brendalee Matthies, CRNA   Modules edited: Notes Section   Notes Section:  File: [id number]

## 2014-02-19 LAB — COMPREHENSIVE METABOLIC PANEL
ALT: 11 U/L (ref 0–35)
AST: 15 U/L (ref 0–37)
Albumin: 1.9 g/dL — ABNORMAL LOW (ref 3.5–5.2)
Alkaline Phosphatase: 87 U/L (ref 39–117)
Anion gap: 10 (ref 5–15)
BUN: 24 mg/dL — ABNORMAL HIGH (ref 6–23)
CO2: 26 meq/L (ref 19–32)
Calcium: 7.6 mg/dL — ABNORMAL LOW (ref 8.4–10.5)
Chloride: 103 mEq/L (ref 96–112)
Creatinine, Ser: 1.2 mg/dL — ABNORMAL HIGH (ref 0.50–1.10)
GFR calc non Af Amer: 64 mL/min — ABNORMAL LOW (ref >90)
GFR, EST AFRICAN AMERICAN: 74 mL/min — AB (ref >90)
Glucose, Bld: 79 mg/dL (ref 70–99)
Potassium: 4.8 mEq/L (ref 3.7–5.3)
SODIUM: 139 meq/L (ref 137–147)
TOTAL PROTEIN: 5.2 g/dL — AB (ref 6.0–8.3)
Total Bilirubin: <0.2 mg/dL — ABNORMAL LOW (ref 0.3–1.2)

## 2014-02-19 LAB — CBC
HCT: 25.2 % — ABNORMAL LOW (ref 36.0–46.0)
HEMOGLOBIN: 8.1 g/dL — AB (ref 12.0–15.0)
MCH: 27.3 pg (ref 26.0–34.0)
MCHC: 32.1 g/dL (ref 30.0–36.0)
MCV: 84.8 fL (ref 78.0–100.0)
Platelets: 233 10*3/uL (ref 150–400)
RBC: 2.97 MIL/uL — ABNORMAL LOW (ref 3.87–5.11)
RDW: 14.3 % (ref 11.5–15.5)
WBC: 16 10*3/uL — ABNORMAL HIGH (ref 4.0–10.5)

## 2014-02-19 LAB — KLEIHAUER-BETKE STAIN
# VIALS RHIG: 1
FETAL CELLS %: 0.1 %
QUANTITATION FETAL HEMOGLOBIN: 5 mL

## 2014-02-19 MED ORDER — RHO D IMMUNE GLOBULIN 1500 UNIT/2ML IJ SOSY
300.0000 ug | PREFILLED_SYRINGE | Freq: Once | INTRAMUSCULAR | Status: AC
  Administered 2014-02-19: 300 ug via INTRAVENOUS
  Filled 2014-02-19: qty 2

## 2014-02-19 MED ORDER — ALBUMIN HUMAN 25 % IV SOLN
25.0000 g | Freq: Once | INTRAVENOUS | Status: AC
  Administered 2014-02-19: 25 g via INTRAVENOUS
  Filled 2014-02-19: qty 100

## 2014-02-19 MED ORDER — FUROSEMIDE 10 MG/ML IJ SOLN
40.0000 mg | Freq: Once | INTRAMUSCULAR | Status: AC
  Administered 2014-02-19: 40 mg via INTRAVENOUS
  Filled 2014-02-19: qty 4

## 2014-02-19 NOTE — Progress Notes (Signed)
Subjective:  Cesarean Delivery POD # 2 Patient reports incisional pain, tolerating PO and + flatus.    Objective: Vital signs in last 24 hours: Temp:  [98 F (36.7 C)-98.8 F (37.1 C)] 98.5 F (36.9 C) (10/22 0612) Pulse Rate:  [74-94] 94 (10/22 0612) Resp:  [16-20] 18 (10/22 0612) BP: (123-145)/(75-97) 139/86 mmHg (10/22 0612) SpO2:  [99 %-100 %] 100 % (10/22 0612)  Physical Exam:  General: alert, cooperative and no distress Lochia: appropriate Uterine Fundus: firm Incision: serosanguinous fluid DVT Evaluation: No evidence of DVT seen on physical exam.   Recent Labs  02/17/14 2355 02/19/14 0645  HGB 10.0* 8.1*  HCT 30.6* 25.2*    Assessment/Plan: Status post Cesarean section.  Pre eclampsia with hemodynamic, fluid  management issues Will give 25 grams of albumin and "chase" with 40 lasix, which should also lower her K a bit to try to at least temporarily help with the third spacing via improved COOP If she is symptomatic a great colloid volume expander would be RBC [name] Young H 02/19/2014, 7:30 AM

## 2014-02-20 LAB — RH IG WORKUP (INCLUDES ABO/RH)
ABO/RH(D): B NEG
ANTIBODY SCREEN: NEGATIVE
GESTATIONAL AGE(WKS): 33.2
UNIT DIVISION: 0

## 2014-02-20 MED ORDER — HYDROCHLOROTHIAZIDE 25 MG PO TABS
25.0000 mg | ORAL_TABLET | Freq: Every day | ORAL | Status: DC
  Administered 2014-02-20: 25 mg via ORAL
  Filled 2014-02-20 (×4): qty 1

## 2014-02-20 MED ORDER — FERROUS SULFATE 325 (65 FE) MG PO TABS
325.0000 mg | ORAL_TABLET | Freq: Three times a day (TID) | ORAL | Status: DC
  Administered 2014-02-20 (×3): 325 mg via ORAL
  Filled 2014-02-20 (×4): qty 1

## 2014-02-20 MED ORDER — OXYCODONE-ACETAMINOPHEN 5-325 MG PO TABS
1.0000 | ORAL_TABLET | ORAL | Status: DC | PRN
  Administered 2014-02-20 – 2014-02-21 (×3): 1 via ORAL
  Filled 2014-02-20 (×3): qty 1

## 2014-02-20 MED ORDER — DOCUSATE SODIUM 100 MG PO CAPS: 100.0000 mg | ORAL_CAPSULE | Freq: Two times a day (BID) | ORAL | Status: DC | PRN

## 2014-02-20 NOTE — Progress Notes (Signed)
Subjective: Postpartum Day 3: Cesarean Delivery at 33w2 for [facility] in setting of severe preeclampsia and worsening renal function  Patient reports incisional pain, tolerating PO and + flatus. No symptoms of anemia. Denies headache, scotoma, chest pain, shortness of breath, RUQ abdominal pain, edema or other concerning symptoms.  Baby is doing well in NICU. Breastfeeding.   Objective: Vital signs in last 24 hours: Temp:  [98.5 F (36.9 C)-99 F (37.2 C)] 98.5 F (36.9 C) (10/23 0450) Pulse Rate:  [86-96] 88 (10/23 0459) Resp:  [16-20] 20 (10/23 0459) BP: (126-146)/(85-96) 139/91 mmHg (10/23 0459) SpO2:  [100 %] 100 % (10/23 0459) 10/22 0701 - 10/23 0700 In: 1480 [P.O.:1480] Out: 6600 [Urine:6600] Overall -3L after aggressive diuresis  Physical Exam:  General: alert and no distress Lungs: CTAB Heart: RRR Lochia: appropriate Uterine Fundus: firm Incision: slight clear drainage present on dressing, present bowel sounds, mild distention Ext: No evidence of DVT seen on physical exam. 2+ DTRs with clonus on BLE Negative Homan's sign.  Results for orders placed during the hospital encounter of 02/15/14 (from the past 48 hour(s))  KLEIHAUER-BETKE STAIN     Status: None   Collection Time    02/19/14  6:00 AM      Result Value Ref Range   Fetal Cells % 0.1     Quantitation Fetal Hemoglobin 5     # Vials RhIg 1    CBC     Status: Abnormal   Collection Time    02/19/14  6:45 AM      Result Value Ref Range   WBC 16.0 (*) 4.0 - 10.5 K/uL   RBC 2.97 (*) 3.87 - 5.11 MIL/uL   Hemoglobin 8.1 (*) 12.0 - 15.0 g/dL   Comment: REPEATED TO VERIFY     DELTA CHECK NOTED   HCT 25.2 (*) 36.0 - 46.0 %   MCV 84.8  78.0 - 100.0 fL   MCH 27.3  26.0 - 34.0 pg   MCHC 32.1  30.0 - 36.0 g/dL   RDW 14.3  11.5 - 15.5 %   Platelets 233  150 - 400 K/uL   Comment: REPEATED TO VERIFY     SPECIMEN CHECKED FOR CLOTS     DELTA CHECK NOTED  COMPREHENSIVE METABOLIC PANEL     Status: Abnormal   Collection  Time    02/19/14  6:45 AM      Result Value Ref Range   Sodium 139  137 - 147 mEq/L   Potassium 4.8  3.7 - 5.3 mEq/L   Chloride 103  96 - 112 mEq/L   CO2 26  19 - 32 mEq/L   Glucose, Bld 79  70 - 99 mg/dL   BUN 24 (*) 6 - 23 mg/dL   Creatinine, Ser 1.20 (*) 0.50 - 1.10 mg/dL   Calcium 7.6 (*) 8.4 - 10.5 mg/dL   Total Protein 5.2 (*) 6.0 - 8.3 g/dL   Albumin 1.9 (*) 3.5 - 5.2 g/dL   AST 15  0 - 37 U/L   ALT 11  0 - 35 U/L   Alkaline Phosphatase 87  39 - 117 U/L   Total Bilirubin <0.2 (*) 0.3 - 1.2 mg/dL   GFR calc non Af Amer 64 (*) >90 mL/min   GFR calc Af Amer 74 (*) >90 mL/min   Comment: (NOTE)     The eGFR has been calculated using the CKD EPI equation.     This calculation has not been validated in all clinical situations.  eGFR's persistently <90 mL/min signify possible Chronic Kidney     Disease.   Anion gap 10  5 - 15  RH IG WORKUP (INCLUDES ABO/RH)     Status: None   Collection Time    02/19/14  6:45 AM      Result Value Ref Range   Gestational Age(Wks) 33.2     ABO/RH(D) B NEG     Antibody Screen NEG     Unit Number [id number]     Blood Component Type RHIG     Unit division 00     Status of Unit ISSUED     Transfusion Status OK TO TRANSFUSE      Assessment/Plan: Status post Cesarean section. Doing well postoperatively. S/p aggressive diuresis, now -3L! Antihypertensives therapy switched to Norvasc 10 mg daily and HCTZ 25 mg po daily. Will monitor response. Started on iron oral therapy and Colace for anemia, asymptomatic. Desires Nexplanon for contraception Transfer to postpartum floor Continue current care. Likely discharge to home tomorrow morning if remains stable.   Osborne Oman, MD 02/20/2014, 6:34 AM

## 2014-02-20 NOTE — Lactation Note (Signed)
This note was copied from the chart of [name] Jean Swindell. Lactation Consultation Note     Follow up consult with this mom of a NICU baby, now 4479 hours old, and 33 5/7 weeks CGA. Mom is being discharged home tomorrow, so I [facility]. Mom is doing well with pumping, still getting small amounts. Hand expression reviewed. Mom knows to ask her baby's nurse to call for lactation, once she is discharged.  Patient Name: [name] Allie DimmerDymond Dutton ZOXWR'UToday's Date: 02/20/2014 Reason for consult: Follow-up assessment   Maternal Data    Feeding Feeding Type: Donor Breast Milk Length of feed: 20 min (on pump due to spitting)  LATCH Score/Interventions                      Lactation Tools Discussed/Used     Consult Status Consult Status: Follow-up Date: 02/21/14 Follow-up type: In-patient    Alfred LevinsLee, [place] Asberry Anne 02/20/2014, 8:54 PM

## 2014-02-21 MED ORDER — IBUPROFEN 600 MG PO TABS: 600.0000 mg | ORAL_TABLET | Freq: Four times a day (QID) | ORAL | Status: DC | PRN

## 2014-02-21 MED ORDER — HYDROCHLOROTHIAZIDE 25 MG PO TABS: 25.0000 mg | ORAL_TABLET | Freq: Every day | ORAL | Status: DC

## 2014-02-21 MED ORDER — OXYCODONE-ACETAMINOPHEN 5-325 MG PO TABS: 1.0000 | ORAL_TABLET | ORAL | Status: DC | PRN

## 2014-02-21 MED ORDER — AMLODIPINE BESYLATE 10 MG PO TABS: 10.0000 mg | ORAL_TABLET | Freq: Every day | ORAL | Status: DC

## 2014-02-21 MED ORDER — FERROUS SULFATE 325 (65 FE) MG PO TABS: 325.0000 mg | ORAL_TABLET | Freq: Three times a day (TID) | ORAL | Status: DC

## 2014-02-21 NOTE — Lactation Note (Signed)
This note was copied from the chart of [name] Young. Lactation Consultation Note  Patient Name: [name] Young [id number]'XToday's Date: 02/21/2014 Reason for consult: Follow-up assessment;NICU baby  Mom being discharged today; 33 week NICU baby.  WIC 1 week Loaner completed with instructions to mom to call WIC on Monday for pump pick-up.  LC checked pump and pump is in working order.  [facility]  Mom reports pumping every 3 hours and is collecting 1 & 1/2 colostrum collection containers (15 ml) with last pumping session.  Mom also reports using hand expression at end of pumping session.  Reviewed with mom the need to pump at least 8 times per day with at least once during the night and using hands-on pumping with hand expression at end of pumping session. Encouraged mom to keep pumping log in NICU booklet.  LC assured that mom removed all pump pieces [facility] and encouraged mom to bring parts to hospital with her when visiting; informed mom of pumping room and continued LC NICU support.  Reviewed milk transport to hospital and storage.  Encouraged mom to call for questions as needed.    Maternal Data Formula Feeding for Exclusion: Yes (NICU Baby) Reason for exclusion: Admission to Intensive Care Unit (ICU) post-partum  Lactation Tools Discussed/Used WIC Program: Yes   Consult Status Consult Status: Complete    [name] Young, [name] Young 02/21/2014, 4:11 PM

## 2014-02-21 NOTE — Discharge Summary (Signed)
Obstetric Discharge Summary Reason for Admission: severe pre-e @ 33.0wks Prenatal Procedures: only 1 pnv at [facility] earlier in pregnancy then denied d/t insurance Intrapartum Procedures: cesarean: low cervical, transverse  At 33.2wks d/t NRFHR and severe pre-e Postpartum Procedures: pp magnesium x 24hrs, lasix Complications-Operative and Postpartum: impaired renal function- now resolved Eating, drinking, voiding, ambulating well.  +flatus.  Lochia and pain wnl.  Denies dizziness, lightheadedness, or sob. No complaints. Denies ha, scotomata, ruq/epigastric pain, n/v.  Has gas pain at the moment, is going to try to pass it w/ walking.   Hospital Course: [name] Young is a 23 y.o. 181P0100 female [facility]. She was admitted to ante or mag therapy, BMZ, bp control, it appears that on Day 1 she was transferred to L&D for IOL.  She received cytotec x 3, then cervical foley bulb, and d/t NRFHR remote from delivery, decision was made for PLTCS. Her pp course has been impaired renal function that has improved, she is diuresing well.  By PPD#4 she is doing well and is deemed to have received the full benefit of her hospital stay.  Filed Vitals:   02/21/14 0459  BP: 131/91  Pulse: 91  Temp: 98.6 F (37 C)  Resp: 18   H/H: Lab Results  Component Value Date/Time   HGB 8.1* 02/19/2014  6:45 AM   HCT 25.2* 02/19/2014  6:45 AM    Physical Exam: General: alert, cooperative and no distress Abdomen/Uterine Fundus: Appropriately tender, non-distended, FF @ U-2 Incision: healing well, no s/s infection, no excessive drainage, honeycomb dressing intact Lochia: appropriate Extremities: No evidence of DVT seen on physical exam. Negative Homan's sign, no cords, calf tenderness, or significant calf/ankle edema   Discharge Diagnoses: 33.2wk PLTCS for severe pre-e, NRFHR, impaired renal function  Discharge Information: Date: 11/10/2010 Activity:  pelvic rest Diet: routine  Medications: PNV, Ibuprofen, Iron and Percocet, norvasc, hctz Breast feeding: No Contraception: Nexplanon, abstinence until placed Circumcision: outpatient Condition: stable Instructions: refer to handout Discharge to: home  Infant:  NICU  Follow-up Information   Schedule an appointment as soon as possible [facility] (4-6 weeks for your postpartum visit and a visit for nexplanon insertion)    Specialty:  Obstetrics and Gynecology   Contact information:   [address] [place] [id number] [phone]      Marge DuncansBooker, Kimberly Randall, CNM, WHNP-BC 02/21/2014,10:50 AM

## 2014-02-21 NOTE — Discharge Instructions (Signed)
Your incision is closed with sutures (stitches) that will dissolve on their own  [facility] for these signs of pre-eclampsia:  Severe headache that does not go away with Tylenol  Visual changes- seeing spots, double, blurred vision  Pain under your right breast or upper abdomen that does not go away with Tums or heartburn medicine  Nausea and/or vomiting  Severe swelling in your hands, feet, and face     Cesarean Delivery, Care After Refer to this sheet in the next few weeks. These instructions provide you with information on caring for yourself after your procedure. Your health care provider may also give you specific instructions. Your treatment has been planned according to current medical practices, but problems sometimes occur. Call your health care provider if you have any problems or questions after you go home. HOME CARE INSTRUCTIONS  Only take over-the-counter or prescription medications as directed by your health care provider.  Do not drink alcohol, especially if you are breastfeeding or taking medication to relieve pain.  Do not chew or smoke tobacco.  Continue to use good perineal care. Good perineal care includes:  Wiping your perineum from front to back.  Keeping your perineum clean.  Check your surgical cut (incision) daily for increased redness, drainage, swelling, or separation of skin.  Clean your incision gently with soap and water every day, and then pat it dry. If your health care provider says it is okay, leave the incision uncovered. Use a bandage (dressing) if the incision is draining fluid or appears irritated. If the adhesive strips across the incision do not fall off within 7 days, carefully peel them off.  Hug a pillow when coughing or sneezing until your incision is healed. This helps to relieve pain.  Do not use tampons or douche until your health care provider says it is okay.  Shower, wash your hair, and take tub baths as directed  by your health care provider.  Wear a well-fitting bra that provides breast support.  Limit wearing support panties or control-top hose.  Drink enough fluids to keep your urine clear or pale yellow.  Eat high-fiber foods such as whole grain cereals and breads, brown rice, beans, and fresh fruits and vegetables every day. These foods may help prevent or relieve constipation.  Resume activities such as climbing stairs, driving, lifting, exercising, or traveling as directed by your health care provider.  Talk to your health care provider about resuming sexual activities. This is dependent upon your risk of infection, your rate of healing, and your comfort and desire to resume sexual activity.  Try to have someone help you with your household activities and your newborn for at least a few days after you leave the hospital.  Rest as much as possible. Try to rest or take a nap when your newborn is sleeping.  Increase your activities gradually.  Keep all of your scheduled postpartum appointments. It is very important to keep your scheduled follow-up appointments. At these appointments, your health care provider will be checking to make sure that you are healing physically and emotionally. SEEK MEDICAL CARE IF:   You are passing large clots from your vagina. Save any clots to show your health care provider.  You have a foul smelling discharge from your vagina.  You have trouble urinating.  You are urinating frequently.  You have pain when you urinate.  You have a change in your bowel movements.  You have increasing redness, pain, or swelling near your incision.  You have pus draining from your incision.  Your incision is separating.  You have painful, hard, or reddened breasts.  You have a severe headache.  You have blurred vision or see spots.  You feel sad or depressed.  You have thoughts of hurting yourself or your newborn.  You have questions about your care, the care  of your newborn, or medications.  You are dizzy or light-headed.  You have a rash.  You have pain, redness, or swelling at the site of the removed intravenous access (IV) tube.  You have nausea or vomiting.  You stopped breastfeeding and have not had a menstrual period within 12 weeks of stopping.  You are not breastfeeding and have not had a menstrual period within 12 weeks of delivery.  You have a fever. SEEK IMMEDIATE MEDICAL CARE IF:  You have persistent pain.  You have chest pain.  You have shortness of breath.  You faint.  You have leg pain.  You have stomach pain.  Your vaginal bleeding saturates 2 or more sanitary pads in 1 hour. MAKE SURE YOU:   Understand these instructions.  Will watch your condition.  Will get help right away if you are not doing well or get worse. Document Released: 01/07/2002 Document Revised: 09/01/2013 Document Reviewed: 12/13/2011 [facility] Patient Information 2015 [place]. This information is not intended to replace advice given to you by your health care provider. Make sure you discuss any questions you have with your health care provider.

## 2014-02-21 NOTE — Progress Notes (Signed)
Discharge instructions reviewed with patient per Tia AlertPaige Grady, RN.  Patient discharged in stable condition without incident.

## 2014-03-02 ENCOUNTER — Encounter (HOSPITAL_COMMUNITY): Payer: Self-pay | Admitting: Obstetrics & Gynecology

## 2014-03-09 ENCOUNTER — Encounter (HOSPITAL_COMMUNITY)
Admission: RE | Admit: 2014-03-09 | Discharge: 2014-03-09 | Disposition: A | Discharge status: Home / Self Care | Payer: Medicaid Other | Source: Ambulatory Visit | Attending: Obstetrics & Gynecology | Admitting: Obstetrics & Gynecology

## 2014-03-09 DIAGNOSIS — O923 Agalactia: Secondary | ICD-10-CM | POA: Diagnosis not present

## 2014-04-08 ENCOUNTER — Encounter (HOSPITAL_COMMUNITY)
Admission: RE | Admit: 2014-04-08 | Discharge: 2014-04-08 | Disposition: A | Discharge status: Home / Self Care | Payer: Medicaid Other | Source: Ambulatory Visit | Attending: Obstetrics & Gynecology | Admitting: Obstetrics & Gynecology

## 2014-04-08 DIAGNOSIS — O923 Agalactia: Secondary | ICD-10-CM | POA: Diagnosis present

## 2014-04-27 ENCOUNTER — Encounter: Payer: Self-pay | Admitting: *Deleted

## 2014-04-28 ENCOUNTER — Encounter: Payer: Self-pay | Admitting: Obstetrics & Gynecology

## 2014-05-09 ENCOUNTER — Encounter (HOSPITAL_COMMUNITY)
Admission: RE | Admit: 2014-05-09 | Discharge: 2014-05-09 | Disposition: A | Discharge status: Home / Self Care | Payer: Medicaid Other | Source: Ambulatory Visit | Attending: Obstetrics & Gynecology | Admitting: Obstetrics & Gynecology

## 2014-05-09 DIAGNOSIS — O923 Agalactia: Secondary | ICD-10-CM | POA: Insufficient documentation

## 2014-06-09 ENCOUNTER — Encounter (HOSPITAL_COMMUNITY)
Admission: RE | Admit: 2014-06-09 | Discharge: 2014-06-09 | Disposition: A | Discharge status: Home / Self Care | Payer: Medicaid Other | Source: Ambulatory Visit | Attending: Obstetrics & Gynecology | Admitting: Obstetrics & Gynecology

## 2014-06-09 DIAGNOSIS — O923 Agalactia: Secondary | ICD-10-CM | POA: Insufficient documentation

## 2014-06-10 ENCOUNTER — Emergency Department (HOSPITAL_COMMUNITY)
Admission: EM | Admit: 2014-06-10 | Discharge: 2014-06-10 | Disposition: A | Discharge status: Home / Self Care | Payer: Medicaid Other | Attending: Emergency Medicine | Admitting: Emergency Medicine

## 2014-06-10 ENCOUNTER — Encounter (HOSPITAL_COMMUNITY): Payer: Self-pay

## 2014-06-10 DIAGNOSIS — Z8669 Personal history of other diseases of the nervous system and sense organs: Secondary | ICD-10-CM | POA: Insufficient documentation

## 2014-06-10 DIAGNOSIS — O210 Mild hyperemesis gravidarum: Secondary | ICD-10-CM | POA: Insufficient documentation

## 2014-06-10 DIAGNOSIS — Z3A00 Weeks of gestation of pregnancy not specified: Secondary | ICD-10-CM | POA: Diagnosis not present

## 2014-06-10 DIAGNOSIS — Z79899 Other long term (current) drug therapy: Secondary | ICD-10-CM | POA: Diagnosis not present

## 2014-06-10 DIAGNOSIS — R112 Nausea with vomiting, unspecified: Secondary | ICD-10-CM

## 2014-06-10 DIAGNOSIS — Z3490 Encounter for supervision of normal pregnancy, unspecified, unspecified trimester: Secondary | ICD-10-CM

## 2014-06-10 LAB — BASIC METABOLIC PANEL
ANION GAP: 9 (ref 5–15)
BUN: 6 mg/dL (ref 6–23)
CALCIUM: 9.2 mg/dL (ref 8.4–10.5)
CO2: 22 mmol/L (ref 19–32)
Chloride: 104 mmol/L (ref 96–112)
Creatinine, Ser: 0.83 mg/dL (ref 0.50–1.10)
GFR calc Af Amer: >90 mL/min (ref >90)
Glucose, Bld: 88 mg/dL (ref 70–99)
POTASSIUM: 3.5 mmol/L (ref 3.5–5.1)
SODIUM: 135 mmol/L (ref 135–145)

## 2014-06-10 LAB — CBC WITH DIFFERENTIAL/PLATELET
BASOS PCT: 1 % (ref 0–1)
Basophils Absolute: 0 10*3/uL (ref 0.0–0.1)
Eosinophils Absolute: 0.1 10*3/uL (ref 0.0–0.7)
Eosinophils Relative: 2 % (ref 0–5)
HEMATOCRIT: 31.5 % — AB (ref 36.0–46.0)
Hemoglobin: 10.1 g/dL — ABNORMAL LOW (ref 12.0–15.0)
LYMPHS ABS: 1.9 10*3/uL (ref 0.7–4.0)
Lymphocytes Relative: 35 % (ref 12–46)
MCH: 23.3 pg — AB (ref 26.0–34.0)
MCHC: 32.1 g/dL (ref 30.0–36.0)
MCV: 72.6 fL — AB (ref 78.0–100.0)
MONOS PCT: 10 % (ref 3–12)
Monocytes Absolute: 0.5 10*3/uL (ref 0.1–1.0)
NEUTROS ABS: 2.7 10*3/uL (ref 1.7–7.7)
NEUTROS PCT: 52 % (ref 43–77)
Platelets: 330 10*3/uL (ref 150–400)
RBC: 4.34 MIL/uL (ref 3.87–5.11)
RDW: 17.1 % — ABNORMAL HIGH (ref 11.5–15.5)
WBC: 5.2 10*3/uL (ref 4.0–10.5)

## 2014-06-10 LAB — HCG, QUANTITATIVE, PREGNANCY: HCG, BETA CHAIN, QUANT, S: 98786 m[IU]/mL — AB (ref <5)

## 2014-06-10 MED ORDER — SODIUM CHLORIDE 0.9 % IV BOLUS (SEPSIS)
1000.0000 mL | Freq: Once | INTRAVENOUS | Status: AC
  Administered 2014-06-10: 1000 mL via INTRAVENOUS

## 2014-06-10 NOTE — ED Notes (Signed)
Refer to paper chart prior to this note, This RN just assumed care of pt.

## 2014-06-10 NOTE — Discharge Instructions (Signed)
Clear liquids as tolerated for the next 12 hours, then slowly advance your diet to normal.  You need to follow up with an OB in the next 1-2 weeks to establish prenatal care.  Return to the emergency department for severe abdominal pain, severe bleeding, or any other new and concerning symptoms.   First Trimester of Pregnancy The first trimester of pregnancy is from week 1 until the end of week 12 (months 1 through 3). A week after a sperm fertilizes an egg, the egg will implant on the wall of the uterus. This embryo will begin to develop into a baby. Genes from you and your partner are forming the baby. The female genes determine whether the baby is a boy or a girl. At 6-8 weeks, the eyes and face are formed, and the heartbeat can be seen on ultrasound. At the end of 12 weeks, all the baby's organs are formed.  Now that you are pregnant, you will want to do everything you can to have a healthy baby. Two of the most important things are to get good prenatal care and to follow your health care provider's instructions. Prenatal care is all the medical care you receive before the baby's birth. This care will help prevent, find, and treat any problems during the pregnancy and childbirth. BODY CHANGES Your body goes through many changes during pregnancy. The changes vary from woman to woman.   You may gain or lose a couple of pounds at first.  You may feel sick to your stomach (nauseous) and throw up (vomit). If the vomiting is uncontrollable, call your health care provider.  You may tire easily.  You may develop headaches that can be relieved by medicines approved by your health care provider.  You may urinate more often. Painful urination may mean you have a bladder infection.  You may develop heartburn as a result of your pregnancy.  You may develop constipation because certain hormones are causing the muscles that push waste through your intestines to slow down.  You may develop hemorrhoids  or swollen, bulging veins (varicose veins).  Your breasts may begin to grow larger and become tender. Your nipples may stick out more, and the tissue that surrounds them (areola) may become darker.  Your gums may bleed and may be sensitive to brushing and flossing.  Dark spots or blotches (chloasma, mask of pregnancy) may develop on your face. This will likely fade after the baby is born.  Your menstrual periods will stop.  You may have a loss of appetite.  You may develop cravings for certain kinds of food.  You may have changes in your emotions from day to day, such as being excited to be pregnant or being concerned that something may go wrong with the pregnancy and baby.  You may have more vivid and strange dreams.  You may have changes in your hair. These can include thickening of your hair, rapid growth, and changes in texture. Some women also have hair loss during or after pregnancy, or hair that feels dry or thin. Your hair will most likely return to normal after your baby is born. WHAT TO EXPECT AT YOUR PRENATAL VISITS During a routine prenatal visit:  You will be weighed to make sure you and the baby are growing normally.  Your blood pressure will be taken.  Your abdomen will be measured to track your baby's growth.  The fetal heartbeat will be listened to starting around week 10 or 12 of your pregnancy.  Test results from any previous visits will be discussed. Your health care provider may ask you:  How you are feeling.  If you are feeling the baby move.  If you have had any abnormal symptoms, such as leaking fluid, bleeding, severe headaches, or abdominal cramping.  If you have any questions. Other tests that may be performed during your first trimester include:  Blood tests to find your blood type and to check for the presence of any previous infections. They will also be used to check for low iron levels (anemia) and Rh antibodies. Later in the pregnancy, blood  tests for diabetes will be done along with other tests if problems develop.  Urine tests to check for infections, diabetes, or protein in the urine.  An ultrasound to confirm the proper growth and development of the baby.  An amniocentesis to check for possible genetic problems.  Fetal screens for spina bifida and Down syndrome.  You may need other tests to make sure you and the baby are doing well. HOME CARE INSTRUCTIONS  Medicines  Follow your health care provider's instructions regarding medicine use. Specific medicines may be either safe or unsafe to take during pregnancy.  Take your prenatal vitamins as directed.  If you develop constipation, try taking a stool softener if your health care provider approves. Diet  Eat regular, well-balanced meals. Choose a variety of foods, such as meat or vegetable-based protein, fish, milk and low-fat dairy products, vegetables, fruits, and whole grain breads and cereals. Your health care provider will help you determine the amount of weight gain that is right for you.  Avoid raw meat and uncooked cheese. These carry germs that can cause birth defects in the baby.  Eating four or five small meals rather than three large meals a day may help relieve nausea and vomiting. If you start to feel nauseous, eating a few soda crackers can be helpful. Drinking liquids between meals instead of during meals also seems to help nausea and vomiting.  If you develop constipation, eat more high-fiber foods, such as fresh vegetables or fruit and whole grains. Drink enough fluids to keep your urine clear or pale yellow. Activity and Exercise  Exercise only as directed by your health care provider. Exercising will help you:  Control your weight.  Stay in shape.  Be prepared for labor and delivery.  Experiencing pain or cramping in the lower abdomen or low back is a good sign that you should stop exercising. Check with your health care provider before  continuing normal exercises.  Try to avoid standing for long periods of time. Move your legs often if you must stand in one place for a long time.  Avoid heavy lifting.  Wear low-heeled shoes, and practice good posture.  You may continue to have sex unless your health care provider directs you otherwise. Relief of Pain or Discomfort  Wear a good support bra for breast tenderness.   Take warm sitz baths to soothe any pain or discomfort caused by hemorrhoids. Use hemorrhoid cream if your health care provider approves.   Rest with your legs elevated if you have leg cramps or low back pain.  If you develop varicose veins in your legs, wear support hose. Elevate your feet for 15 minutes, 3-4 times a day. Limit salt in your diet. Prenatal Care  Schedule your prenatal visits by the twelfth week of pregnancy. They are usually scheduled monthly at first, then more often in the last 2 months before delivery.  Write  down your questions. Take them to your prenatal visits.  Keep all your prenatal visits as directed by your health care provider. Safety  Wear your seat belt at all times when driving.  Make a list of emergency phone numbers, including numbers for family, friends, the hospital, and police and fire departments. General Tips  Ask your health care provider for a referral to a local prenatal education class. Begin classes no later than at the beginning of month 6 of your pregnancy.  Ask for help if you have counseling or nutritional needs during pregnancy. Your health care provider can offer advice or refer you to specialists for help with various needs.  Do not use hot tubs, steam rooms, or saunas.  Do not douche or use tampons or scented sanitary pads.  Do not cross your legs for long periods of time.  Avoid cat litter boxes and soil used by cats. These carry germs that can cause birth defects in the baby and possibly loss of the fetus by miscarriage or stillbirth.  Avoid  all smoking, herbs, alcohol, and medicines not prescribed by your health care provider. Chemicals in these affect the formation and growth of the baby.  Schedule a dentist appointment. At home, brush your teeth with a soft toothbrush and be gentle when you floss. SEEK MEDICAL CARE IF:   You have dizziness.  You have mild pelvic cramps, pelvic pressure, or nagging pain in the abdominal area.  You have persistent nausea, vomiting, or diarrhea.  You have a bad smelling vaginal discharge.  You have pain with urination.  You notice increased swelling in your face, hands, legs, or ankles. SEEK IMMEDIATE MEDICAL CARE IF:   You have a fever.  You are leaking fluid from your vagina.  You have spotting or bleeding from your vagina.  You have severe abdominal cramping or pain.  You have rapid weight gain or loss.  You vomit blood or material that looks like coffee grounds.  You are exposed to [place] measles and have never had them.  You are exposed to fifth disease or chickenpox.  You develop a severe headache.  You have shortness of breath.  You have any kind of trauma, such as from a fall or a car accident. Document Released: 04/11/2001 Document Revised: 09/01/2013 Document Reviewed: 02/25/2013 [facility] Patient Information 2015 [place], [place]. This information is not intended to replace advice given to you by your health care provider. Make sure you discuss any questions you have with your health care provider.

## 2014-06-10 NOTE — ED Provider Notes (Signed)
CSN: [id number]     Arrival date & time 06/10/14  0043 History  This chart was scribed for Geoffery Lyons, MD by Bronson Curb, ED Scribe. This patient was seen in room D34C/D34C and the patient's care was started at 3:18 AM.   No chief complaint on file.   Patient is a 24 y.o. female presenting with vomiting. The history is provided by the patient. No language interpreter was used.  Emesis Severity:  Moderate Duration:  3 days Timing:  Intermittent Quality:  Stomach contents Progression:  Unchanged Chronicity:  New Recent urination:  Normal Context: not post-tussive and not self-induced   Relieved by:  None tried Worsened by:  Nothing tried Ineffective treatments:  None tried Risk factors: pregnant now      HPI Comments: [name] Young is a 24 y.o. female who presents to the Emergency Department complaining of intermittent vomiting for the past 3 days. Patient states she took a pregnant test, 1.5 weeks, which resulted positive and suspects this was the cause of her vomiting until she began vomiting small amounts of blood 10 PTA. Patient states this is her second pregancy. G2P1A0. She denies abdominal pain, vaginal discharge, vaginal bleedings, or urinary symptoms. Patient has not seen an OB/GYN since she taking the pregnancy test.   Past Medical History  Diagnosis Date  . Medical history non-contributory   . Pregnancy induced hypertension   . Neuromuscular disorder    Past Surgical History  Procedure Laterality Date  . No past surgeries    . Cesarean section N/A 02/17/2014    Procedure: CESAREAN SECTION;  Surgeon: Tereso Newcomer, MD;  Location: [facility];  Service: Obstetrics;  Laterality: N/A;   Family History  Problem Relation Age of Onset  . Hypertension Mother   . Hypertension Father    History  Substance Use Topics  . Smoking status: Never Smoker   . Smokeless tobacco: Never Used  . Alcohol Use: No   OB History    Gravida Para Term Preterm AB TAB SAB Ectopic  Multiple Living   0   0       Review of Systems  Gastrointestinal: Positive for nausea and vomiting.  All other systems reviewed and are negative.     Allergies  Review of patient's allergies indicates no known allergies.  Home Medications   Prior to Admission medications   Medication Sig Start Date End Date Taking? Authorizing Provider  amLODipine (NORVASC) 10 MG tablet Take 1 tablet (10 mg total) by mouth daily. 02/21/14   Marge Duncans, CNM  ferrous sulfate 325 (65 FE) MG tablet Take 1 tablet (325 mg total) by mouth 3 (three) times daily with meals. 02/21/14   Marge Duncans, CNM  hydrochlorothiazide (HYDRODIURIL) 25 MG tablet Take 1 tablet (25 mg total) by mouth daily. 02/21/14   Marge Duncans, CNM  ibuprofen (ADVIL,MOTRIN) 600 MG tablet Take 1 tablet (600 mg total) by mouth every 6 (six) hours as needed for mild pain, moderate pain or cramping. 02/21/14   Marge Duncans, CNM  oxyCODONE-acetaminophen (PERCOCET/ROXICET) 5-325 MG per tablet Take 1-2 tablets by mouth every 4 (four) hours as needed for moderate pain or severe pain. 02/21/14   Adam Phenix, MD  Prenatal Vit-Fe Fumarate-FA (PRENATAL MULTIVITAMIN) TABS tablet Take 1 tablet by mouth daily at 12 noon.    Historical Provider, MD   Triage Vitals: BP 98/56 mmHg  Pulse 87  Temp(Src) 98 F (36.7 C) (Oral)  Resp 18  SpO2 100%  Physical Exam  Constitutional: She is oriented to person, place, and time. She appears well-developed and well-nourished. No distress.  HENT:  Head: Normocephalic and atraumatic.  Eyes: Conjunctivae and EOM are normal.  Neck: Neck supple. No tracheal deviation present.  Cardiovascular: Normal rate, regular rhythm and normal heart sounds.   Pulmonary/Chest: Effort normal and breath sounds normal. No respiratory distress.  Abdominal: Soft. Bowel sounds are normal. She exhibits no distension. There is no tenderness. There is no rebound and no guarding.   Musculoskeletal: Normal range of motion.  Neurological: She is alert and oriented to person, place, and time.  Skin: Skin is warm and dry.  Psychiatric: She has a normal mood and affect. Her behavior is normal.  Nursing note and vitals reviewed.   ED Course  Procedures (including critical care time)  DIAGNOSTIC STUDIES: Oxygen Saturation is 100% on room air, normal by my interpretation.    COORDINATION OF CARE: At [id number] Discussed treatment plan with patient which includes IV fluids and labs. Patient agrees.   Labs Review Labs Reviewed - No data to display  Imaging Review No results found.   EKG Interpretation None      MDM   Final diagnoses:  None    Patient is a 24 year old female G2 P1001 who presents for evaluation of vomiting. She took a pregnancy test last week which stated that it was positive. She reports intermittent nausea, vomiting, and loss of appetite over the past several weeks, but denies abdominal pain or bloody stool. She does report an episode of hematemesis today and presents for evaluation of this.  She has remained hemodynamically stable while in the emergency department. Her hemoglobin is 10.1 which is an improvement from prior studies. Her quantitative beta hCG is nearly 100,000 and electrolyte panel is essentially unremarkable. She was given normal saline and observed in the emergency department for several hours. She has had no further vomiting and has remained hemodynamically stable throughout. I suspect the hematemesis was related to a Mallory-Weiss tear caused by her pregnancy-induced vomiting.  I do not feel as though there is an acute process and believe she is appropriate for discharge. She is to return as needed if her symptoms substantially worsen or change. I would like to avoid prescribing potentially harmful anti-emetics at this point and the patient appreciates this.  I personally performed the services described in this documentation, which  was scribed in my presence. The recorded information has been reviewed and is accurate.      Geoffery Lyonsouglas Wiliam Cauthorn, MD 06/10/14 [phone]

## 2014-06-11 ENCOUNTER — Telehealth: Payer: Self-pay

## 2014-06-11 NOTE — Telephone Encounter (Signed)
Attempted to contact patient to gather information regarding LMP-- per chart review patient recently pregnant and delivered preterm at 5046w3d via C-section for severe pre-eclampsia on 02/22/15. No answer. Left message stating we are trying to reach you, please call clinic.

## 2014-06-11 NOTE — Telephone Encounter (Signed)
-----   Message from Vivien Rotaheryl A Clinton sent at 06/11/2014  1:18 PM EST ----- Patient is wanting to start her prenatal care here.  thanks

## 2014-06-16 NOTE — Telephone Encounter (Signed)
Called patient's listed mobile number-- partner picked up and stated patient can be reached at [phone]. Called this number and spoke to patient. Patient reports LMP 04/14/14 (first period after having baby in October 2015). Patient states she is unsure if she would like to go through with this pregnancy. Informed her she can contact [facility] or call around to local offices to see if they will discuss termination with her-- informed her we do not do that here. Informed her I will not schedule initial visit-- advised that if she decides to go through with pregnancy she can certainly call back and we can schedule appointment at that time. Patient verbalized understanding and gratitude. No further questions or concern.

## 2014-07-09 ENCOUNTER — Encounter (HOSPITAL_COMMUNITY)
Admission: RE | Admit: 2014-07-09 | Discharge: 2014-07-09 | Disposition: A | Discharge status: Home / Self Care | Payer: Medicaid Other | Source: Ambulatory Visit | Attending: Obstetrics & Gynecology | Admitting: Obstetrics & Gynecology

## 2014-07-09 DIAGNOSIS — O923 Agalactia: Secondary | ICD-10-CM | POA: Diagnosis not present

## 2014-08-09 ENCOUNTER — Encounter (HOSPITAL_COMMUNITY)
Admission: RE | Admit: 2014-08-09 | Discharge: 2014-08-09 | Disposition: A | Discharge status: Home / Self Care | Payer: Medicaid Other | Source: Ambulatory Visit | Attending: Obstetrics & Gynecology | Admitting: Obstetrics & Gynecology

## 2014-08-09 DIAGNOSIS — O923 Agalactia: Secondary | ICD-10-CM | POA: Insufficient documentation

## 2014-08-24 ENCOUNTER — Encounter (HOSPITAL_COMMUNITY): Payer: Self-pay | Admitting: *Deleted

## 2014-08-24 ENCOUNTER — Emergency Department (HOSPITAL_COMMUNITY)
Admission: EM | Admit: 2014-08-24 | Discharge: 2014-08-24 | Discharge status: Absent without leave | Payer: Self-pay | Source: Home / Self Care

## 2014-08-24 ENCOUNTER — Inpatient Hospital Stay (HOSPITAL_COMMUNITY)
Admission: AD | Admit: 2014-08-24 | Discharge: 2014-08-24 | Disposition: A | Discharge status: Home / Self Care | Payer: Medicaid Other | Source: Ambulatory Visit | Attending: Obstetrics & Gynecology | Admitting: Obstetrics & Gynecology

## 2014-08-24 DIAGNOSIS — O360131 Maternal care for anti-D [Rh] antibodies, third trimester, fetus 1: Secondary | ICD-10-CM

## 2014-08-24 DIAGNOSIS — O26899 Other specified pregnancy related conditions, unspecified trimester: Secondary | ICD-10-CM

## 2014-08-24 DIAGNOSIS — N760 Acute vaginitis: Secondary | ICD-10-CM | POA: Insufficient documentation

## 2014-08-24 DIAGNOSIS — O9989 Other specified diseases and conditions complicating pregnancy, childbirth and the puerperium: Secondary | ICD-10-CM | POA: Insufficient documentation

## 2014-08-24 DIAGNOSIS — Z3A18 18 weeks gestation of pregnancy: Secondary | ICD-10-CM | POA: Insufficient documentation

## 2014-08-24 DIAGNOSIS — B9689 Other specified bacterial agents as the cause of diseases classified elsewhere: Secondary | ICD-10-CM

## 2014-08-24 DIAGNOSIS — A499 Bacterial infection, unspecified: Secondary | ICD-10-CM

## 2014-08-24 DIAGNOSIS — R109 Unspecified abdominal pain: Secondary | ICD-10-CM | POA: Insufficient documentation

## 2014-08-24 DIAGNOSIS — O23592 Infection of other part of genital tract in pregnancy, second trimester: Secondary | ICD-10-CM | POA: Insufficient documentation

## 2014-08-24 DIAGNOSIS — O0932 Supervision of pregnancy with insufficient antenatal care, second trimester: Secondary | ICD-10-CM | POA: Insufficient documentation

## 2014-08-24 LAB — URINALYSIS, ROUTINE W REFLEX MICROSCOPIC
BILIRUBIN URINE: NEGATIVE
Glucose, UA: NEGATIVE mg/dL
Hgb urine dipstick: NEGATIVE
Ketones, ur: NEGATIVE mg/dL
LEUKOCYTES UA: NEGATIVE
Nitrite: NEGATIVE
PROTEIN: NEGATIVE mg/dL
Specific Gravity, Urine: >1.03 — ABNORMAL HIGH (ref 1.005–1.030)
Urobilinogen, UA: 0.2 mg/dL (ref 0.0–1.0)
pH: 6 (ref 5.0–8.0)

## 2014-08-24 LAB — CBC
HCT: 32.9 % — ABNORMAL LOW (ref 36.0–46.0)
HEMOGLOBIN: 11 g/dL — AB (ref 12.0–15.0)
MCH: 26.3 pg (ref 26.0–34.0)
MCHC: 33.4 g/dL (ref 30.0–36.0)
MCV: 78.7 fL (ref 78.0–100.0)
PLATELETS: 275 10*3/uL (ref 150–400)
RBC: 4.18 MIL/uL (ref 3.87–5.11)
RDW: 15.7 % — AB (ref 11.5–15.5)
WBC: 5.4 10*3/uL (ref 4.0–10.5)

## 2014-08-24 LAB — COMPREHENSIVE METABOLIC PANEL
ALT: 9 U/L (ref 0–35)
AST: 15 U/L (ref 0–37)
Albumin: 3.9 g/dL (ref 3.5–5.2)
Alkaline Phosphatase: 52 U/L (ref 39–117)
Anion gap: 8 (ref 5–15)
BUN: 11 mg/dL (ref 6–23)
CALCIUM: 9.3 mg/dL (ref 8.4–10.5)
CO2: 23 mmol/L (ref 19–32)
Chloride: 105 mmol/L (ref 96–112)
Creatinine, Ser: 0.67 mg/dL (ref 0.50–1.10)
GFR calc Af Amer: >90 mL/min (ref >90)
GFR calc non Af Amer: >90 mL/min (ref >90)
GLUCOSE: 67 mg/dL — AB (ref 70–99)
POTASSIUM: 3.9 mmol/L (ref 3.5–5.1)
SODIUM: 136 mmol/L (ref 135–145)
Total Bilirubin: 0.4 mg/dL (ref 0.3–1.2)
Total Protein: 7.2 g/dL (ref 6.0–8.3)

## 2014-08-24 LAB — WET PREP, GENITAL
Trich, Wet Prep: NONE SEEN
YEAST WET PREP: NONE SEEN

## 2014-08-24 MED ORDER — PRENATAL MULTIVITAMIN CH
1.0000 | ORAL_TABLET | Freq: Every day | ORAL | Status: DC
Start: 2014-08-24 — End: 2016-04-27

## 2014-08-24 MED ORDER — METRONIDAZOLE 500 MG PO TABS: 500.0000 mg | ORAL_TABLET | Freq: Two times a day (BID) | ORAL | Status: DC

## 2014-08-24 NOTE — MAU Provider Note (Signed)
History     CSN: [id number]  Arrival date and time: 08/24/14 1512   First Provider Initiated Contact with Patient 08/24/14 1559      No chief complaint on file.  HPI   Ms. [name] Young is a 24 y.o. female G2P0100 at 47w1dby LMP  presents with abdominal pain. The pain started 2 weeks ago and has worsened over the two week course. The pain is described as cramping; at times it is constant and sometimes it comes and goes. Sitting down makes the pain lesson in severity. Currently she rates her pain a 4/10. She has not taken anything over the counter for the pain. She has not started prenatal care for this pregnancy. She has started feeling the baby move inconsistently  She denies vaginal bleeding She denies leaking of fluid; she does have a yellow colored discharge with an odor. She noticed the discharge about a week ago.   She delivered her last baby at 33weeks; primary cesarean section due to severe preeclampsia. She had no prenatal care.   OB History    Gravida Para Term Preterm AB TAB SAB Ectopic Multiple Living   _0 0   0        Past Medical History  Diagnosis Date  . Medical history non-contributory   . Pregnancy induced hypertension   . Neuromuscular disorder     Past Surgical History  Procedure Laterality Date  . No past surgeries    . Cesarean section N/A 02/17/2014    Procedure: CESAREAN SECTION;  Surgeon: UOsborne Oman MD;  Location: [facility];  Service: Obstetrics;  Laterality: N/A;    Family History  Problem Relation Age of Onset  . Hypertension Mother   . Hypertension Father     History  Substance Use Topics  . Smoking status: Never Smoker   . Smokeless tobacco: Never Used  . Alcohol Use: No    Allergies: No Known Allergies  Prescriptions prior to admission  Medication Sig Dispense Refill Last Dose  . amLODipine (NORVASC) 10 MG tablet Take 1 tablet (10 mg total) by mouth daily. (Patient not taking: Reported on 06/10/2014) 30 tablet 3   .  ferrous sulfate 325 (65 FE) MG tablet Take 1 tablet (325 mg total) by mouth 3 (three) times daily with meals. (Patient not taking: Reported on 06/10/2014) 90 tablet 3   . hydrochlorothiazide (HYDRODIURIL) 25 MG tablet Take 1 tablet (25 mg total) by mouth daily. (Patient not taking: Reported on 06/10/2014) 30 tablet 3   . ibuprofen (ADVIL,MOTRIN) 600 MG tablet Take 1 tablet (600 mg total) by mouth every 6 (six) hours as needed for mild pain, moderate pain or cramping. (Patient not taking: Reported on 06/10/2014) 30 tablet 0   . oxyCODONE-acetaminophen (PERCOCET/ROXICET) 5-325 MG per tablet Take 1-2 tablets by mouth every 4 (four) hours as needed for moderate pain or severe pain. (Patient not taking: Reported on 06/10/2014) 30 tablet 0   . Prenatal Vit-Fe Fumarate-FA (PRENATAL MULTIVITAMIN) TABS tablet Take 1 tablet by mouth daily at 12 noon.   02/14/2014 at Unknown time   Results for orders placed or performed during the hospital encounter of 08/24/14 (from the past 48 hour(s))  Urinalysis, Routine w reflex microscopic     Status: Abnormal   Collection Time: 08/24/14  3:40 PM  Result Value Ref Range   Color, Urine YELLOW YELLOW   APPearance CLEAR CLEAR   Specific Gravity, Urine >1.030 (H) 1.005 - 1.030   pH 6.0  5.0 - 8.0   Glucose, UA NEGATIVE NEGATIVE mg/dL   Hgb urine dipstick NEGATIVE NEGATIVE   Bilirubin Urine NEGATIVE NEGATIVE   Ketones, ur NEGATIVE NEGATIVE mg/dL   Protein, ur NEGATIVE NEGATIVE mg/dL   Urobilinogen, UA 0.2 0.0 - 1.0 mg/dL   Nitrite NEGATIVE NEGATIVE   Leukocytes, UA NEGATIVE NEGATIVE    Comment: MICROSCOPIC NOT DONE ON URINES WITH NEGATIVE PROTEIN, BLOOD, LEUKOCYTES, NITRITE, OR GLUCOSE <1000 mg/dL.  Wet prep, genital     Status: Abnormal   Collection Time: 08/24/14  4:25 PM  Result Value Ref Range   Yeast Wet Prep HPF POC NONE SEEN NONE SEEN   Trich, Wet Prep NONE SEEN NONE SEEN   Clue Cells Wet Prep HPF POC FEW (A) NONE SEEN   WBC, Wet Prep HPF POC FEW (A) NONE SEEN     Comment: MANY BACTERIA SEEN  CBC     Status: Abnormal   Collection Time: 08/24/14  4:35 PM  Result Value Ref Range   WBC 5.4 4.0 - 10.5 K/uL   RBC 4.18 3.87 - 5.11 MIL/uL   Hemoglobin 11.0 (L) 12.0 - 15.0 g/dL   HCT 32.9 (L) 36.0 - 46.0 %   MCV 78.7 78.0 - 100.0 fL   MCH 26.3 26.0 - 34.0 pg   MCHC 33.4 30.0 - 36.0 g/dL   RDW 15.7 (H) 11.5 - 15.5 %   Platelets 275 150 - 400 K/uL  Comprehensive metabolic panel     Status: Abnormal   Collection Time: 08/24/14  4:35 PM  Result Value Ref Range   Sodium 136 135 - 145 mmol/L   Potassium 3.9 3.5 - 5.1 mmol/L   Chloride 105 96 - 112 mmol/L   CO2 23 19 - 32 mmol/L   Glucose, Bld 67 (L) 70 - 99 mg/dL   BUN 11 6 - 23 mg/dL   Creatinine, Ser 0.67 0.50 - 1.10 mg/dL   Calcium 9.3 8.4 - 10.5 mg/dL   Total Protein 7.2 6.0 - 8.3 g/dL   Albumin 3.9 3.5 - 5.2 g/dL   AST 15 0 - 37 U/L   ALT 9 0 - 35 U/L   Alkaline Phosphatase 52 39 - 117 U/L   Total Bilirubin 0.4 0.3 - 1.2 mg/dL   GFR calc non Af Amer >90 >90 mL/min   GFR calc Af Amer >90 >90 mL/min    Comment: (NOTE) The eGFR has been calculated using the CKD EPI equation. This calculation has not been validated in all clinical situations. eGFR's persistently <90 mL/min signify possible Chronic Kidney Disease.    Anion gap 8 5 - 15    Review of Systems  Constitutional: Negative for fever and chills.  Gastrointestinal: Positive for abdominal pain. Negative for nausea and vomiting.  Genitourinary: Negative for dysuria, urgency, frequency and hematuria.       Denies vaginal bleeding    Physical Exam   Blood pressure 106/66, pulse 90, temperature 98.2 F (36.8 C), temperature source Oral, resp. rate 18, height $RemoveBe'5\' 4"'MMfYKaDjL$  (1.626 m), weight 58.514 kg (129 lb), last menstrual period 04/19/2014, unknown if currently breastfeeding.  Physical Exam  Constitutional: She is oriented to person, place, and time. She appears well-developed and well-nourished. No distress.  HENT:  Head:  Normocephalic.  Eyes: Pupils are equal, round, and reactive to light.  Neck: Neck supple.  Respiratory: Effort normal.  Genitourinary:  Speculum exam: Patient anxious about exam  Vagina - Small amount of creamy, white discharge, mild odor Cervix - No contact  bleeding Bimanual exam: Cervix closed Uterus non tender, difficult to assess uterine size due to patients discomfort with exam. Adnexa non tender, no masses bilaterally GC/Chlam, wet prep done Chaperone present for exam.  Musculoskeletal: Normal range of motion.  Neurological: She is oriented to person, place, and time.  Skin: Skin is warm. She is not diaphoretic.  Psychiatric: Her behavior is normal.    MAU Course  Procedures  None  MDM  + feta heart tones by doppler   B negative blood type  CBC CMET     Assessment and Plan   A:  Bacterial vaginosis Abdominal pain in pregnancy; likely round ligament High risk pregnancy due to severe preeclampsia with last delivery; 6 months ago.  No prenatal care  P:  Discharge home in stable condition Referral made to high risk [facility]  Out patient [place] made; [place] to call patient to schedule  ASA 81 mg daily RX: Flagyl  Prenatal vitamins daily    Lezlie Lye, NP 08/24/2014 4:19 PM

## 2014-08-24 NOTE — Discharge Instructions (Signed)

## 2014-08-24 NOTE — MAU Note (Signed)
Knows she is pregnant.  For the past 2 wks, has been having some cramps/ yesterday had some bad pain, called the hosp and then called EMS, they got there,offered to bring her or she could have some one bring her, pain had gone from 8 to 4, so waited.  Pain returned today, keeps going between 8/4. No bleeding.  Has not gotten any care, was confirmed at hosp, months ago.

## 2014-08-25 LAB — GC/CHLAMYDIA PROBE AMP (~~LOC~~) NOT AT ARMC
CHLAMYDIA, DNA PROBE: NEGATIVE
Neisseria Gonorrhea: NEGATIVE

## 2014-08-25 LAB — HIV ANTIBODY (ROUTINE TESTING W REFLEX): HIV SCREEN 4TH GENERATION: NONREACTIVE

## 2014-09-03 ENCOUNTER — Ambulatory Visit (HOSPITAL_COMMUNITY)
Admission: RE | Admit: 2014-09-03 | Discharge: 2014-09-03 | Disposition: A | Discharge status: Home / Self Care | Payer: Self-pay | Source: Ambulatory Visit | Attending: Obstetrics and Gynecology | Admitting: Obstetrics and Gynecology

## 2014-09-03 ENCOUNTER — Telehealth: Payer: Self-pay

## 2014-09-03 DIAGNOSIS — Z36 Encounter for antenatal screening of mother: Secondary | ICD-10-CM | POA: Insufficient documentation

## 2014-09-03 DIAGNOSIS — O26899 Other specified pregnancy related conditions, unspecified trimester: Secondary | ICD-10-CM

## 2014-09-03 DIAGNOSIS — Z3A19 19 weeks gestation of pregnancy: Secondary | ICD-10-CM | POA: Insufficient documentation

## 2014-09-03 DIAGNOSIS — O3432 Maternal care for cervical incompetence, second trimester: Secondary | ICD-10-CM | POA: Insufficient documentation

## 2014-09-03 DIAGNOSIS — O09212 Supervision of pregnancy with history of pre-term labor, second trimester: Secondary | ICD-10-CM | POA: Insufficient documentation

## 2014-09-03 DIAGNOSIS — R109 Unspecified abdominal pain: Secondary | ICD-10-CM

## 2014-09-03 DIAGNOSIS — Z3689 Encounter for other specified antenatal screening: Secondary | ICD-10-CM | POA: Insufficient documentation

## 2014-09-03 DIAGNOSIS — O360131 Maternal care for anti-D [Rh] antibodies, third trimester, fetus 1: Secondary | ICD-10-CM

## 2014-09-03 NOTE — Telephone Encounter (Signed)
-----   Message from Vivien Rotaachael H Small, Rad Tech sent at 09/03/2014 12:09 PM EDT ----- Regarding: U/S OB We have just completed a 2nd or 3rd trimester outpatient ultrasound scheduled for a patient who was seen in MAU.  Please call the patient with the results.

## 2014-09-03 NOTE — Telephone Encounter (Signed)
Called patient and informed her of U/S results. Informed her f/u will be scheduled at appointment next week. Patient verbalized understanding and gratitude. No questions or concerns.

## 2014-09-08 ENCOUNTER — Encounter (HOSPITAL_COMMUNITY)
Admission: RE | Admit: 2014-09-08 | Discharge: 2014-09-08 | Disposition: A | Discharge status: Home / Self Care | Payer: Self-pay | Source: Ambulatory Visit | Attending: Obstetrics & Gynecology | Admitting: Obstetrics & Gynecology

## 2014-09-08 DIAGNOSIS — O923 Agalactia: Secondary | ICD-10-CM | POA: Insufficient documentation

## 2014-09-10 ENCOUNTER — Encounter: Payer: Self-pay | Admitting: Obstetrics & Gynecology

## 2014-09-10 ENCOUNTER — Encounter: Payer: Medicaid Other | Admitting: Obstetrics & Gynecology

## 2014-09-18 ENCOUNTER — Encounter: Payer: Self-pay | Admitting: General Practice

## 2014-10-09 ENCOUNTER — Encounter (HOSPITAL_COMMUNITY)
Admission: RE | Admit: 2014-10-09 | Discharge: 2014-10-09 | Disposition: A | Discharge status: Home / Self Care | Payer: Self-pay | Source: Ambulatory Visit | Attending: Obstetrics & Gynecology | Admitting: Obstetrics & Gynecology

## 2014-10-09 DIAGNOSIS — O923 Agalactia: Secondary | ICD-10-CM | POA: Insufficient documentation

## 2014-11-09 ENCOUNTER — Encounter (HOSPITAL_COMMUNITY)
Admission: RE | Admit: 2014-11-09 | Discharge: 2014-11-09 | Disposition: A | Discharge status: Home / Self Care | Payer: Medicaid Other | Source: Ambulatory Visit | Attending: Obstetrics & Gynecology | Admitting: Obstetrics & Gynecology

## 2014-11-09 DIAGNOSIS — O923 Agalactia: Secondary | ICD-10-CM | POA: Diagnosis present

## 2014-12-09 ENCOUNTER — Encounter (HOSPITAL_COMMUNITY): Payer: Medicaid Other | Attending: Obstetrics & Gynecology

## 2014-12-09 DIAGNOSIS — O923 Agalactia: Secondary | ICD-10-CM | POA: Insufficient documentation

## 2014-12-23 ENCOUNTER — Ambulatory Visit (INDEPENDENT_AMBULATORY_CARE_PROVIDER_SITE_OTHER): Payer: Self-pay | Admitting: Family

## 2014-12-23 ENCOUNTER — Encounter: Payer: Self-pay | Admitting: Family

## 2014-12-23 ENCOUNTER — Other Ambulatory Visit: Payer: Self-pay | Admitting: Family

## 2014-12-23 VITALS — BP 123/85 | HR 83 | Wt 162.8 lb

## 2014-12-23 DIAGNOSIS — O99343 Other mental disorders complicating pregnancy, third trimester: Secondary | ICD-10-CM

## 2014-12-23 DIAGNOSIS — F329 Major depressive disorder, single episode, unspecified: Secondary | ICD-10-CM

## 2014-12-23 DIAGNOSIS — F32A Depression, unspecified: Secondary | ICD-10-CM

## 2014-12-23 DIAGNOSIS — O34219 Maternal care for unspecified type scar from previous cesarean delivery: Secondary | ICD-10-CM

## 2014-12-23 DIAGNOSIS — O3421 Maternal care for scar from previous cesarean delivery: Secondary | ICD-10-CM

## 2014-12-23 DIAGNOSIS — Z118 Encounter for screening for other infectious and parasitic diseases: Secondary | ICD-10-CM

## 2014-12-23 DIAGNOSIS — O09893 Supervision of other high risk pregnancies, third trimester: Secondary | ICD-10-CM

## 2014-12-23 DIAGNOSIS — O360131 Maternal care for anti-D [Rh] antibodies, third trimester, fetus 1: Secondary | ICD-10-CM

## 2014-12-23 DIAGNOSIS — O368131 Decreased fetal movements, third trimester, fetus 1: Secondary | ICD-10-CM

## 2014-12-23 DIAGNOSIS — O09899 Supervision of other high risk pregnancies, unspecified trimester: Secondary | ICD-10-CM | POA: Insufficient documentation

## 2014-12-23 DIAGNOSIS — Z113 Encounter for screening for infections with a predominantly sexual mode of transmission: Secondary | ICD-10-CM

## 2014-12-23 DIAGNOSIS — Z3483 Encounter for supervision of other normal pregnancy, third trimester: Secondary | ICD-10-CM

## 2014-12-23 DIAGNOSIS — Z3402 Encounter for supervision of normal first pregnancy, second trimester: Secondary | ICD-10-CM

## 2014-12-23 DIAGNOSIS — O09213 Supervision of pregnancy with history of pre-term labor, third trimester: Secondary | ICD-10-CM

## 2014-12-23 DIAGNOSIS — O26893 Other specified pregnancy related conditions, third trimester: Secondary | ICD-10-CM

## 2014-12-23 DIAGNOSIS — Z124 Encounter for screening for malignant neoplasm of cervix: Secondary | ICD-10-CM

## 2014-12-23 DIAGNOSIS — O09219 Supervision of pregnancy with history of pre-term labor, unspecified trimester: Secondary | ICD-10-CM

## 2014-12-23 DIAGNOSIS — N898 Other specified noninflammatory disorders of vagina: Secondary | ICD-10-CM

## 2014-12-23 LAB — OB RESULTS CONSOLE GBS: GBS: NEGATIVE

## 2014-12-23 LAB — POCT URINALYSIS DIP (DEVICE)
BILIRUBIN URINE: NEGATIVE
Glucose, UA: NEGATIVE mg/dL
Hgb urine dipstick: NEGATIVE
Ketones, ur: NEGATIVE mg/dL
NITRITE: NEGATIVE
PH: 6.5 (ref 5.0–8.0)
Protein, ur: NEGATIVE mg/dL
Specific Gravity, Urine: 1.025 (ref 1.005–1.030)
Urobilinogen, UA: 0.2 mg/dL (ref 0.0–1.0)

## 2014-12-23 MED ORDER — RHO D IMMUNE GLOBULIN 1500 UNIT/2ML IJ SOSY
300.0000 ug | PREFILLED_SYRINGE | Freq: Once | INTRAMUSCULAR | Status: AC
  Administered 2014-12-23: 300 ug via INTRAMUSCULAR

## 2014-12-23 NOTE — Progress Notes (Signed)
Subjective:    [name] Young is a G2P0101 [redacted]w[redacted]d being seen today for her first obstetrical visit.  Her obstetrical history is significant for pre-eclampsia and csection due to fetal distress secondary to induction of labor for severe preeclampsia.  Also complicated by short pregnancy interval.  Recent delivery in October 2015. Patient considering adoption.   + vaginal discharge with odor x 5 days.  Pregnancy history fully reviewed.  After review of TOLAC v Csection risk desires TOLAC.  Decreased fetal movement over past two days.  Feels 2-3 movements in an hour when stops moving.  Last movement felt in past hour.    Patient reports no complaints.  Filed Vitals:   12/23/14 1010  BP: 123/85  Pulse: 83  Weight: 162 lb 12.8 oz (73.846 kg)    HISTORY: OB History  Gravida Para Term Preterm AB SAB TAB Ectopic Multiple Living  2 1  1  0   0  1    # Outcome Date GA Lbr Len/2nd Weight Sex Delivery Anes PTL Lv  2 Current           1 Preterm 02/17/14 [redacted]w[redacted]d   F CS-LTranv Spinal  Y     Past Medical History  Diagnosis Date  . Medical history non-contributory   . Pregnancy induced hypertension   . Neuromuscular disorder    Past Surgical History  Procedure Laterality Date  . No past surgeries    . Cesarean section N/A 02/17/2014    Procedure: CESAREAN SECTION;  Surgeon: Tereso Newcomer, MD;  Location: [facility];  Service: Obstetrics;  Laterality: N/A;   Family History  Problem Relation Age of Onset  . Hypertension Mother   . Hypertension Father      Exam    BP 123/85 mmHg  Pulse 83  Wt 162 lb 12.8 oz (73.846 kg)  LMP 04/19/2014 Uterine Size: size equals dates  Pelvic Exam:    Perineum: No Hemorrhoids, Normal Perineum   Vulva: normal   Vagina:  normal mucosa, white creamy discharge, no palpable nodules   pH: Not done   Cervix: no bleeding following Pap, no cervical motion tenderness and no lesions   Adnexa: normal adnexa and no mass, fullness, tenderness   Bony Pelvis: Adequate   System: Breast:  No nipple retraction or dimpling, No nipple discharge or bleeding, No axillary or supraclavicular adenopathy, Normal to palpation without dominant masses   Skin: normal coloration and turgor, no rashes    Neurologic: negative   Extremities: normal strength, tone, and muscle mass   HEENT neck supple with midline trachea and thyroid without masses   Mouth/Teeth mucous membranes moist, pharynx normal without lesions   Neck supple and no masses   Cardiovascular: regular rate and rhythm, no murmurs or gallops   Respiratory:  appears well, vitals normal, no respiratory distress, acyanotic, normal RR, neck free of mass or lymphadenopathy, chest clear, no wheezing, crepitations, rhonchi, normal symmetric air entry   Abdomen: soft, non-tender; bowel sounds normal; no masses,  no organomegaly   Urinary: urethral meatus normal       Assessment:    Pregnancy:   24 y.o. G2P0101 at [redacted]w[redacted]d wks IUP Late Prenatal Care Patient Active Problem List   Diagnosis Date Noted  . Previous cesarean delivery affecting pregnancy, antepartum 12/23/2014  . History of preterm delivery, currently pregnant 12/23/2014  . Hx of preeclampsia, prior pregnancy, currently pregnant 02/15/2014  . Rh negative status during pregnancy 08/24/2013  . Vitamin D deficiency 08/24/2013  . Supervision of  normal first pregnancy 08/15/2013  Vaginal Discharge      Plan:     Initial labs drawn. Pap smear collected.  1 hour glucola also obtained.  GBS and GC and chlamydia collected. Wet prep collected Rhopylac today Non-stress test - reactive Prenatal vitamins. Problem list reviewed and updated. Genetic Screening:  Too late Ultrasound discussed; fetal survey: results reviewed.  Growth ultrasound ordered Follow up in 1 weeks.  Marlis Edelson 12/23/2014

## 2014-12-23 NOTE — Progress Notes (Signed)
Breastfeeding Tip of the Week reviewed Initial visit New OB labs with 1hr gtt New OB and 28 w educational material given Decline flu and tdap Needs rhogam today Pt reports decreased fetal movemant past 3 days Reports increased amt vag discharge with itching

## 2014-12-23 NOTE — Patient Instructions (Signed)
Third Trimester of Pregnancy The third trimester is from week 29 through week 42, months 7 through 9. The third trimester is a time when the fetus is growing rapidly. At the end of the ninth month, the fetus is about 20 inches in length and weighs 6-10 pounds.  BODY CHANGES Your body goes through many changes during pregnancy. The changes vary from woman to woman.   Your weight will continue to increase. You can expect to gain 25-35 pounds (11-16 kg) by the end of the pregnancy.  You may begin to get stretch marks on your hips, abdomen, and breasts.  You may urinate more often because the fetus is moving lower into your pelvis and pressing on your bladder.  You may develop or continue to have heartburn as a result of your pregnancy.  You may develop constipation because certain hormones are causing the muscles that push waste through your intestines to slow down.  You may develop hemorrhoids or swollen, bulging veins (varicose veins).  You may have pelvic pain because of the weight gain and pregnancy hormones relaxing your joints between the bones in your pelvis. Backaches may result from overexertion of the muscles supporting your posture.  You may have changes in your hair. These can include thickening of your hair, rapid growth, and changes in texture. Some women also have hair loss during or after pregnancy, or hair that feels dry or thin. Your hair will most likely return to normal after your baby is born.  Your breasts will continue to grow and be tender. A yellow discharge may leak from your breasts called colostrum.  Your belly button may stick out.  You may feel short of breath because of your expanding uterus.  You may notice the fetus "dropping," or moving lower in your abdomen.  You may have a bloody mucus discharge. This usually occurs a few days to a week before labor begins.  Your cervix becomes thin and soft (effaced) near your due date. WHAT TO EXPECT AT YOUR  PRENATAL EXAMS  You will have prenatal exams every 2 weeks until week 36. Then, you will have weekly prenatal exams. During a routine prenatal visit:  You will be weighed to make sure you and the fetus are growing normally.  Your blood pressure is taken.  Your abdomen will be measured to track your baby's growth.  The fetal heartbeat will be listened to.  Any test results from the previous visit will be discussed.  You may have a cervical check near your due date to see if you have effaced. At around 36 weeks, your caregiver will check your cervix. At the same time, your caregiver will also perform a test on the secretions of the vaginal tissue. This test is to determine if a type of bacteria, Group B streptococcus, is present. Your caregiver will explain this further. Your caregiver may ask you:  What your birth plan is.  How you are feeling.  If you are feeling the baby move.  If you have had any abnormal symptoms, such as leaking fluid, bleeding, severe headaches, or abdominal cramping.  If you have any questions. Other tests or screenings that may be performed during your third trimester include:  Blood tests that check for low iron levels (anemia).  Fetal testing to check the health, activity level, and growth of the fetus. Testing is done if you have certain medical conditions or if there are problems during the pregnancy. FALSE LABOR You may feel small, irregular contractions that   eventually go away. These are called Braxton Hicks contractions, or false labor. Contractions may last for hours, days, or even weeks before true labor sets in. If contractions come at regular intervals, intensify, or become painful, it is best to be seen by your caregiver.  SIGNS OF LABOR   Menstrual-like cramps.  Contractions that are 5 minutes apart or less.  Contractions that start on the top of the uterus and spread down to the lower abdomen and back.  A sense of increased pelvic  pressure or back pain.  A watery or bloody mucus discharge that comes from the vagina. If you have any of these signs before the 37th week of pregnancy, call your caregiver right away. You need to go to the hospital to get checked immediately. HOME CARE INSTRUCTIONS   Avoid all smoking, herbs, alcohol, and unprescribed drugs. These chemicals affect the formation and growth of the baby.  Follow your caregiver's instructions regarding medicine use. There are medicines that are either safe or unsafe to take during pregnancy.  Exercise only as directed by your caregiver. Experiencing uterine cramps is a good sign to stop exercising.  Continue to eat regular, healthy meals.  Wear a good support bra for breast tenderness.  Do not use hot tubs, steam rooms, or saunas.  Wear your seat belt at all times when driving.  Avoid raw meat, uncooked cheese, cat litter boxes, and soil used by cats. These carry germs that can cause birth defects in the baby.  Take your prenatal vitamins.  Try taking a stool softener (if your caregiver approves) if you develop constipation. Eat more high-fiber foods, such as fresh vegetables or fruit and whole grains. Drink plenty of fluids to keep your urine clear or pale yellow.  Take warm sitz baths to soothe any pain or discomfort caused by hemorrhoids. Use hemorrhoid cream if your caregiver approves.  If you develop varicose veins, wear support hose. Elevate your feet for 15 minutes, 3-4 times a day. Limit salt in your diet.  Avoid heavy lifting, wear low heal shoes, and practice good posture.  Rest a lot with your legs elevated if you have leg cramps or low back pain.  Visit your dentist if you have not gone during your pregnancy. Use a soft toothbrush to brush your teeth and be gentle when you floss.  A sexual relationship may be continued unless your caregiver directs you otherwise.  Do not travel far distances unless it is absolutely necessary and only  with the approval of your caregiver.  Take prenatal classes to understand, practice, and ask questions about the labor and delivery.  Make a trial run to the hospital.  Pack your hospital bag.  Prepare the baby's nursery.  Continue to go to all your prenatal visits as directed by your caregiver. SEEK MEDICAL CARE IF:  You are unsure if you are in labor or if your water has broken.  You have dizziness.  You have mild pelvic cramps, pelvic pressure, or nagging pain in your abdominal area.  You have persistent nausea, vomiting, or diarrhea.  You have a bad smelling vaginal discharge.  You have pain with urination. SEEK IMMEDIATE MEDICAL CARE IF:   You have a fever.  You are leaking fluid from your vagina.  You have spotting or bleeding from your vagina.  You have severe abdominal cramping or pain.  You have rapid weight loss or gain.  You have shortness of breath with chest pain.  You notice sudden or extreme swelling   of your face, hands, ankles, feet, or legs. °· You have not felt your baby move in over an hour. °· You have severe headaches that do not go away with medicine. °· You have vision changes. °Document Released: 04/11/2001 Document Revised: 04/22/2013 Document Reviewed: 06/18/2012 °ExitCare® Patient Information ©2015 [employer]. This information is not intended to replace advice given to you by your health care provider. Make sure you discuss any questions you have with your health care provider. °Trial of Labor After Cesarean Delivery Information °A trial of labor after cesarean delivery (TOLAC) is when a woman tries to give birth vaginally after a previous cesarean delivery. TOLAC may be a safe and appropriate option for you depending on your medical history and other risk factors. When TOLAC is successful and you are able to have a vaginal delivery, this is called a vaginal birth after cesarean delivery (VBAC).  °CANDIDATES FOR TOLAC °TOLAC is possible for some women who: °· Have  undergone one or two prior cesarean deliveries in which the incision of the uterus was horizontal (low transverse). °· Are carrying twins and have had one prior low transverse incision during a cesarean delivery. °· Do not have a vertical (classical) uterine scar. °· Have not had a tear in the wall of their uterus (uterine rupture). °TOLAC is also supported for women who meet appropriate criteria and: °· Are under the age of 40 years. °· Are tall and have a body mass index (BMI) of less than 30. °· Have an unknown uterine scar. °· Give birth in a facility equipped to handle an emergency cesarean delivery. This team should be able to handle possible complications such as a uterine rupture. °· Have thorough counseling about the benefits and risks of TOLAC. °· Have discussed future pregnancy plans with their health care provider. °· Plan to have several more pregnancies. °MOST SUCCESSFUL CANDIDATES FOR TOLAC: °· Have had a successful vaginal delivery before or after their cesarean delivery. °· Experience labor that begins naturally on or before the due date (40 weeks of gestation). °· Do not have a very large (macrosomic) baby.   °· Had a prior cesarean delivery but are not currently experiencing factors that would prompt a cesarean delivery (such as a breech position). °· Had only one prior cesarean delivery.  °· Had a prior cesarean delivery that was performed early in labor and not after full cervical dilation. °TOLAC may be most appropriate for women who meet the above guidelines and who plan to have more pregnancies. TOLAC is not recommended for home births. °LEAST SUCCESSFUL CANDIDATES FOR TOLAC: °· Have an induced labor with an unfavorable cervix. An unfavorable cervix is when the cervix is not dilating enough (among other factors). °· Have never had a vaginal delivery. °· Have had more than two cesarean deliveries. °· Have a pregnancy at more than 40 weeks of gestation. °· Are pregnant with a baby with a  suspected weight greater than 4,000 grams (8½ pounds) and who have no prior history of a vaginal delivery. °· Have closely spaced pregnancies. °SUGGESTED BENEFITS OF TOLAC °· You may have a faster recovery time. °· You may have a shorter stay in the hospital. °· You may have less pain and fewer problems than with a cesarean delivery. Women who have a cesarean delivery have a higher chance of needing blood or getting a fever, an infection, or a blood clot in the legs. °SUGGESTED RISKS OF TOLAC °The highest risk of complications happens to women who attempt a TOLAC   and fail. A failed TOLAC results in an unplanned cesarean delivery. Risks related to TOLAC or repeat cesarean deliveries include:  °· Blood loss. °· Infection. °· Blood clot. °· Injury to surrounding tissues or organs. °· Having to remove the uterus (hysterectomy). °· Potential problems with the placenta (such as placenta previa or placenta accreta) in future pregnancies.  °Although very rare, the main concerns with TOLAC are: °· Rupture of the uterine scar from a past cesarean delivery. °· Needing an emergency cesarean delivery. °· Having a bad outcome for the baby (perinatal morbidity). °FOR MORE INFORMATION °American Congress of Obstetricians and Gynecologists: www.acog.org °American College of Nurse-Midwives: www.midwife.org °Document Released: 01/03/2011 Document Revised: 02/05/2013 Document Reviewed: 10/07/2012 °ExitCare® Patient Information ©2015 [employer]. This information is not intended to replace advice given to you by your health care provider. Make sure you discuss any questions you have with your health care provider. ° °

## 2014-12-24 ENCOUNTER — Encounter: Payer: Self-pay | Admitting: Family

## 2014-12-24 ENCOUNTER — Telehealth: Payer: Self-pay | Admitting: Family

## 2014-12-24 ENCOUNTER — Ambulatory Visit: Payer: Self-pay

## 2014-12-24 DIAGNOSIS — O99343 Other mental disorders complicating pregnancy, third trimester: Secondary | ICD-10-CM

## 2014-12-24 DIAGNOSIS — F32A Depression, unspecified: Secondary | ICD-10-CM | POA: Insufficient documentation

## 2014-12-24 DIAGNOSIS — Z283 Underimmunization status: Secondary | ICD-10-CM | POA: Insufficient documentation

## 2014-12-24 DIAGNOSIS — Z2839 Other underimmunization status: Secondary | ICD-10-CM | POA: Insufficient documentation

## 2014-12-24 DIAGNOSIS — F329 Major depressive disorder, single episode, unspecified: Secondary | ICD-10-CM | POA: Insufficient documentation

## 2014-12-24 DIAGNOSIS — O99019 Anemia complicating pregnancy, unspecified trimester: Secondary | ICD-10-CM | POA: Insufficient documentation

## 2014-12-24 DIAGNOSIS — O9989 Other specified diseases and conditions complicating pregnancy, childbirth and the puerperium: Secondary | ICD-10-CM

## 2014-12-24 LAB — PRENATAL PROFILE (SOLSTAS)
ANTIBODY SCREEN: NEGATIVE
BASOS PCT: 1 % (ref 0–1)
Basophils Absolute: 0.1 10*3/uL (ref 0.0–0.1)
EOS ABS: 0.1 10*3/uL (ref 0.0–0.7)
EOS PCT: 1 % (ref 0–5)
HCT: 30.2 % — ABNORMAL LOW (ref 36.0–46.0)
HEMOGLOBIN: 9.4 g/dL — AB (ref 12.0–15.0)
HIV 1&2 Ab, 4th Generation: NONREACTIVE
Hepatitis B Surface Ag: NEGATIVE
Lymphocytes Relative: 21 % (ref 12–46)
Lymphs Abs: 1.4 10*3/uL (ref 0.7–4.0)
MCH: 23.7 pg — AB (ref 26.0–34.0)
MCHC: 31.1 g/dL (ref 30.0–36.0)
MCV: 76.3 fL — ABNORMAL LOW (ref 78.0–100.0)
MONOS PCT: 8 % (ref 3–12)
MPV: 9.9 fL (ref 8.6–12.4)
Monocytes Absolute: 0.5 10*3/uL (ref 0.1–1.0)
NEUTROS PCT: 69 % (ref 43–77)
Neutro Abs: 4.6 10*3/uL (ref 1.7–7.7)
Platelets: 313 10*3/uL (ref 150–400)
RBC: 3.96 MIL/uL (ref 3.87–5.11)
RDW: 15.6 % — ABNORMAL HIGH (ref 11.5–15.5)
RH TYPE: NEGATIVE
RUBELLA: 0.82 {index} (ref <0.90)
WBC: 6.6 10*3/uL (ref 4.0–10.5)

## 2014-12-24 LAB — CULTURE, OB URINE

## 2014-12-24 LAB — GC/CHLAMYDIA PROBE AMP (~~LOC~~) NOT AT ARMC
CHLAMYDIA, DNA PROBE: NEGATIVE
Neisseria Gonorrhea: NEGATIVE

## 2014-12-24 LAB — WET PREP, GENITAL
Clue Cells Wet Prep HPF POC: NONE SEEN
Trich, Wet Prep: NONE SEEN
Yeast Wet Prep HPF POC: NONE SEEN

## 2014-12-24 LAB — GLUCOSE TOLERANCE, 1 HOUR (50G) W/O FASTING: GLUCOSE 1 HOUR GTT: 92 mg/dL (ref 70–140)

## 2014-12-24 MED ORDER — SERTRALINE HCL 50 MG PO TABS: 50.0000 mg | ORAL_TABLET | Freq: Every day | ORAL | Status: DC

## 2014-12-24 MED ORDER — INTEGRA F 125-1 MG PO CAPS: 1.0000 | ORAL_CAPSULE | Freq: Every day | ORAL | Status: DC

## 2014-12-24 NOTE — Telephone Encounter (Signed)
Called patient to inform about anemia.  Unable to leave voicemail.

## 2014-12-24 NOTE — Addendum Note (Signed)
Addended by: Marlis Edelson on: 12/24/2014 05:30 PM   Modules accepted: Orders

## 2014-12-25 ENCOUNTER — Encounter: Payer: Self-pay | Admitting: *Deleted

## 2014-12-25 LAB — HEMOGLOBINOPATHY EVALUATION
HEMOGLOBIN OTHER: 0 %
HGB A2 QUANT: 2.7 % (ref 2.2–3.2)
HGB A: 97.3 % (ref 96.8–97.8)
HGB F QUANT: 0 % (ref 0.0–2.0)
HGB S QUANTITAION: 0 %

## 2014-12-25 LAB — CULTURE, BETA STREP (GROUP B ONLY)

## 2014-12-25 LAB — CYTOLOGY - PAP

## 2014-12-30 ENCOUNTER — Encounter: Payer: Self-pay | Admitting: Certified Nurse Midwife

## 2014-12-31 ENCOUNTER — Encounter: Payer: Self-pay | Admitting: Obstetrics & Gynecology

## 2015-01-01 ENCOUNTER — Other Ambulatory Visit: Payer: Self-pay | Admitting: Family

## 2015-01-01 ENCOUNTER — Ambulatory Visit (HOSPITAL_COMMUNITY)
Admission: RE | Admit: 2015-01-01 | Discharge: 2015-01-01 | Disposition: A | Discharge status: Home / Self Care | Payer: Medicaid Other | Source: Ambulatory Visit | Attending: Family | Admitting: Family

## 2015-01-01 DIAGNOSIS — O3421 Maternal care for scar from previous cesarean delivery: Secondary | ICD-10-CM | POA: Insufficient documentation

## 2015-01-01 DIAGNOSIS — Z3402 Encounter for supervision of normal first pregnancy, second trimester: Secondary | ICD-10-CM | POA: Diagnosis not present

## 2015-01-01 DIAGNOSIS — Z8751 Personal history of pre-term labor: Secondary | ICD-10-CM

## 2015-01-01 DIAGNOSIS — Z3A36 36 weeks gestation of pregnancy: Secondary | ICD-10-CM | POA: Diagnosis not present

## 2015-01-01 DIAGNOSIS — O34219 Maternal care for unspecified type scar from previous cesarean delivery: Secondary | ICD-10-CM

## 2015-01-01 DIAGNOSIS — IMO0002 Reserved for concepts with insufficient information to code with codable children: Secondary | ICD-10-CM

## 2015-01-02 DIAGNOSIS — IMO0002 Reserved for concepts with insufficient information to code with codable children: Secondary | ICD-10-CM | POA: Insufficient documentation

## 2015-01-06 ENCOUNTER — Ambulatory Visit (INDEPENDENT_AMBULATORY_CARE_PROVIDER_SITE_OTHER): Payer: Self-pay | Admitting: Obstetrics and Gynecology

## 2015-01-06 VITALS — BP 130/82 | HR 74 | Temp 97.8°F | Wt 162.2 lb

## 2015-01-06 DIAGNOSIS — F329 Major depressive disorder, single episode, unspecified: Secondary | ICD-10-CM

## 2015-01-06 DIAGNOSIS — O09893 Supervision of other high risk pregnancies, third trimester: Secondary | ICD-10-CM

## 2015-01-06 DIAGNOSIS — O09213 Supervision of pregnancy with history of pre-term labor, third trimester: Secondary | ICD-10-CM

## 2015-01-06 DIAGNOSIS — O99343 Other mental disorders complicating pregnancy, third trimester: Secondary | ICD-10-CM

## 2015-01-06 DIAGNOSIS — O365931 Maternal care for other known or suspected poor fetal growth, third trimester, fetus 1: Secondary | ICD-10-CM

## 2015-01-06 DIAGNOSIS — O368131 Decreased fetal movements, third trimester, fetus 1: Secondary | ICD-10-CM

## 2015-01-06 DIAGNOSIS — Z3483 Encounter for supervision of other normal pregnancy, third trimester: Secondary | ICD-10-CM

## 2015-01-06 DIAGNOSIS — O3421 Maternal care for scar from previous cesarean delivery: Secondary | ICD-10-CM

## 2015-01-06 DIAGNOSIS — Z3490 Encounter for supervision of normal pregnancy, unspecified, unspecified trimester: Secondary | ICD-10-CM | POA: Insufficient documentation

## 2015-01-06 DIAGNOSIS — O34219 Maternal care for unspecified type scar from previous cesarean delivery: Secondary | ICD-10-CM

## 2015-01-06 DIAGNOSIS — Z3493 Encounter for supervision of normal pregnancy, unspecified, third trimester: Secondary | ICD-10-CM

## 2015-01-06 DIAGNOSIS — F32A Depression, unspecified: Secondary | ICD-10-CM

## 2015-01-06 LAB — POCT URINALYSIS DIP (DEVICE)
Bilirubin Urine: NEGATIVE
Glucose, UA: NEGATIVE mg/dL
Ketones, ur: NEGATIVE mg/dL
Nitrite: NEGATIVE
Protein, ur: NEGATIVE mg/dL
Specific Gravity, Urine: 1.02 (ref 1.005–1.030)
Urobilinogen, UA: 0.2 mg/dL (ref 0.0–1.0)
pH: 6 (ref 5.0–8.0)

## 2015-01-06 NOTE — Progress Notes (Addendum)
Has not started zoloft yet because pharmacy stated they did not have the med.  Also wants to see SW because has decided on BUFA.Moderate hemoglobin noted on urinalysis. Called patient pharmacy and verified they have the order for the zoloft.

## 2015-01-06 NOTE — Progress Notes (Signed)
D/W Dr. Adrian Blackwater who agrees with plan of care.

## 2015-01-06 NOTE — Progress Notes (Signed)
Subjective:  [name] Young is a 24 y.o. G2P0101 at [redacted]w[redacted]d being seen today for ongoing prenatal care.  Patient reports no contractions, no leaking, vomiting and poor appetite. Did not yet start Zoloft. Vaginal irritation resolved. .  Contractions: Not present.  Vag. Bleeding: None. Movement: Present. Good FM. Denies leaking of fluid.   The following portions of the patient's history were reviewed and updated as appropriate: allergies, current medications, past family history, past medical history, past social history, past surgical history and problem list.   Objective:   Filed Vitals:   01/06/15 1143  BP: 130/82  Pulse: 74  Temp: 97.8 F (36.6 C)  Weight: 162 lb 3.2 oz (73.573 kg)   [place] 01/01/15 at [redacted]w[redacted]d: SGA/IUGR, EFW <10th, AC<3rd; AFI 13; BPP 8/8; UA S:D 3.7 MFM recommendation to consider IOL b/t 37-38wks  Fetal Status:     Movement: Present     General:  Alert, oriented and cooperative. Patient is in no acute distress.  Skin: Skin is warm and dry. No rash noted.   Cardiovascular: Normal heart rate noted  Respiratory: Normal respiratory effort, no problems with respiration noted  Abdomen: Soft, gravid, appropriate for gestational age. Pain/Pressure: Present     Pelvic: Vag. Bleeding: None Vag D/C Character: White   Cervical exam deferred        Extremities: Normal range of motion.  Edema: Trace  Mental Status: Normal mood and affect. Normal behavior. Normal judgment and thought content.   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 SW to meet with patient today re adoption. Will review with Dr. Adrian Blackwater when available. Schedule IOL tomorrow.      Danae Orleans, CNM

## 2015-01-06 NOTE — Progress Notes (Signed)
CSW received call from clinic RN requesting CSW to meet with parents who are interested in making an adoption plan.  CSW met with patient and FOB in the clinic.  Patient reports feeling sure that she wants to "push through" with an adoption plan.  FOB states they have talked a lot about this and feel this is the best decision for their family.  CSW respects this decision and informed them of support available.  They have not chosen an agency.  CSW offered a list of agencies, but patient declined, specifically requesting that CSW choose for her.  CSW answered questions and provided education on the adoption process.  CSW encouraged parents to think about what they want, as far as contact with baby, at delivery.  CSW added that they can change their minds any time, but stressed importance of communicating wishes with hospital staff.  CSW asked if an agency representative may call patient today or tomorrow, if available, and patient agrees.  CSW provided contact information to patient and asked that she or FOB call with any questions or needs.  A CSW will also follow up with patient and FOB after delivery, to which they are aware.  CSW spoke with T. Dinsbe[employer]/[facility] to inquire about their availability over the weekend.  Ms. Armandina Stammer states that a representative from their agency is available to call patient prior to IOL and available to meet with family at the hospital this weekend as needed.

## 2015-01-06 NOTE — Progress Notes (Signed)
Per Leola Brazil, CNM pt can go home and her IOL has been scheduled for midnight tomorrow as they had discussed.  If there are any questions and follow up we would give pt a call.   Pt notified of recommendations and pt gave # [phone] and FOB # [phone].

## 2015-01-08 ENCOUNTER — Inpatient Hospital Stay (HOSPITAL_COMMUNITY): Payer: Medicaid Other | Admitting: Anesthesiology

## 2015-01-08 ENCOUNTER — Encounter (HOSPITAL_COMMUNITY)
Admission: AD | Disposition: A | Discharge status: Home / Self Care | Payer: Self-pay | Source: Ambulatory Visit | Attending: Obstetrics & Gynecology

## 2015-01-08 ENCOUNTER — Ambulatory Visit (HOSPITAL_COMMUNITY): Payer: Self-pay

## 2015-01-08 ENCOUNTER — Encounter (HOSPITAL_COMMUNITY): Payer: Self-pay | Admitting: *Deleted

## 2015-01-08 ENCOUNTER — Inpatient Hospital Stay (HOSPITAL_COMMUNITY)
Admission: AD | Admit: 2015-01-08 | Discharge: 2015-01-10 | DRG: 765 | Disposition: A | Discharge status: Home / Self Care | Payer: Medicaid Other | Source: Ambulatory Visit | Attending: Obstetrics & Gynecology | Admitting: Obstetrics & Gynecology

## 2015-01-08 DIAGNOSIS — O99344 Other mental disorders complicating childbirth: Secondary | ICD-10-CM | POA: Diagnosis present

## 2015-01-08 DIAGNOSIS — Z3A37 37 weeks gestation of pregnancy: Secondary | ICD-10-CM | POA: Diagnosis present

## 2015-01-08 DIAGNOSIS — O99343 Other mental disorders complicating pregnancy, third trimester: Secondary | ICD-10-CM

## 2015-01-08 DIAGNOSIS — O368131 Decreased fetal movements, third trimester, fetus 1: Secondary | ICD-10-CM

## 2015-01-08 DIAGNOSIS — O365930 Maternal care for other known or suspected poor fetal growth, third trimester, not applicable or unspecified: Secondary | ICD-10-CM | POA: Diagnosis present

## 2015-01-08 DIAGNOSIS — Z6791 Unspecified blood type, Rh negative: Secondary | ICD-10-CM

## 2015-01-08 DIAGNOSIS — O3421 Maternal care for scar from previous cesarean delivery: Principal | ICD-10-CM | POA: Diagnosis present

## 2015-01-08 DIAGNOSIS — O26899 Other specified pregnancy related conditions, unspecified trimester: Secondary | ICD-10-CM | POA: Diagnosis present

## 2015-01-08 DIAGNOSIS — Z283 Underimmunization status: Secondary | ICD-10-CM

## 2015-01-08 DIAGNOSIS — O99019 Anemia complicating pregnancy, unspecified trimester: Secondary | ICD-10-CM | POA: Diagnosis present

## 2015-01-08 DIAGNOSIS — F329 Major depressive disorder, single episode, unspecified: Secondary | ICD-10-CM | POA: Diagnosis present

## 2015-01-08 DIAGNOSIS — Z8249 Family history of ischemic heart disease and other diseases of the circulatory system: Secondary | ICD-10-CM | POA: Diagnosis not present

## 2015-01-08 DIAGNOSIS — O09899 Supervision of other high risk pregnancies, unspecified trimester: Secondary | ICD-10-CM

## 2015-01-08 DIAGNOSIS — O9989 Other specified diseases and conditions complicating pregnancy, childbirth and the puerperium: Secondary | ICD-10-CM

## 2015-01-08 DIAGNOSIS — O34219 Maternal care for unspecified type scar from previous cesarean delivery: Secondary | ICD-10-CM | POA: Diagnosis present

## 2015-01-08 DIAGNOSIS — Z3490 Encounter for supervision of normal pregnancy, unspecified, unspecified trimester: Secondary | ICD-10-CM

## 2015-01-08 DIAGNOSIS — F32A Depression, unspecified: Secondary | ICD-10-CM | POA: Diagnosis present

## 2015-01-08 DIAGNOSIS — Z2839 Other underimmunization status: Secondary | ICD-10-CM

## 2015-01-08 DIAGNOSIS — IMO0002 Reserved for concepts with insufficient information to code with codable children: Secondary | ICD-10-CM | POA: Diagnosis present

## 2015-01-08 DIAGNOSIS — Z98891 History of uterine scar from previous surgery: Secondary | ICD-10-CM

## 2015-01-08 HISTORY — DX: Major depressive disorder, single episode, unspecified: F32.9

## 2015-01-08 HISTORY — DX: Depression, unspecified: F32.A

## 2015-01-08 LAB — CBC
HEMATOCRIT: 30.6 % — AB (ref 36.0–46.0)
Hemoglobin: 9.4 g/dL — ABNORMAL LOW (ref 12.0–15.0)
MCH: 23.3 pg — AB (ref 26.0–34.0)
MCHC: 30.7 g/dL (ref 30.0–36.0)
MCV: 75.9 fL — AB (ref 78.0–100.0)
Platelets: 295 10*3/uL (ref 150–400)
RBC: 4.03 MIL/uL (ref 3.87–5.11)
RDW: 16 % — AB (ref 11.5–15.5)
WBC: 7.8 10*3/uL (ref 4.0–10.5)

## 2015-01-08 LAB — MRSA PCR SCREENING: MRSA by PCR: NEGATIVE

## 2015-01-08 LAB — RPR: RPR Ser Ql: NONREACTIVE

## 2015-01-08 SURGERY — Surgical Case
Anesthesia: Spinal | Site: Abdomen

## 2015-01-08 MED ORDER — OXYTOCIN 10 UNIT/ML IJ SOLN
INTRAMUSCULAR | Status: AC
  Filled 2015-01-08: qty 4

## 2015-01-08 MED ORDER — MEPERIDINE HCL 25 MG/ML IJ SOLN: 6.2500 mg | INTRAMUSCULAR | Status: DC | PRN

## 2015-01-08 MED ORDER — OXYTOCIN 40 UNITS IN LACTATED RINGERS INFUSION - SIMPLE MED: 62.5000 mL/h | INTRAVENOUS | Status: AC

## 2015-01-08 MED ORDER — ONDANSETRON HCL 4 MG/2ML IJ SOLN
4.0000 mg | Freq: Once | INTRAMUSCULAR | Status: AC
  Administered 2015-01-08: 4 mg via INTRAVENOUS

## 2015-01-08 MED ORDER — SIMETHICONE 80 MG PO CHEW
80.0000 mg | CHEWABLE_TABLET | ORAL | Status: DC
  Administered 2015-01-09: 80 mg via ORAL
  Filled 2015-01-08: qty 1

## 2015-01-08 MED ORDER — ONDANSETRON HCL 4 MG/2ML IJ SOLN: 4.0000 mg | Freq: Four times a day (QID) | INTRAMUSCULAR | Status: DC | PRN

## 2015-01-08 MED ORDER — KETOROLAC TROMETHAMINE 30 MG/ML IJ SOLN: 30.0000 mg | Freq: Four times a day (QID) | INTRAMUSCULAR | Status: AC | PRN

## 2015-01-08 MED ORDER — DIPHENHYDRAMINE HCL 25 MG PO CAPS: 25.0000 mg | ORAL_CAPSULE | Freq: Four times a day (QID) | ORAL | Status: DC | PRN

## 2015-01-08 MED ORDER — KETOROLAC TROMETHAMINE 30 MG/ML IJ SOLN
INTRAMUSCULAR | Status: AC
  Filled 2015-01-08: qty 1

## 2015-01-08 MED ORDER — TETANUS-DIPHTH-ACELL PERTUSSIS 5-2.5-18.5 LF-MCG/0.5 IM SUSP: 0.5000 mL | Freq: Once | INTRAMUSCULAR | Status: DC

## 2015-01-08 MED ORDER — DEXAMETHASONE SODIUM PHOSPHATE 10 MG/ML IJ SOLN
INTRAMUSCULAR | Status: DC | PRN
  Administered 2015-01-08: 4 mg via INTRAVENOUS

## 2015-01-08 MED ORDER — OXYCODONE-ACETAMINOPHEN 5-325 MG PO TABS
2.0000 | ORAL_TABLET | ORAL | Status: DC | PRN
  Administered 2015-01-09: 2 via ORAL
  Filled 2015-01-08 (×2): qty 2

## 2015-01-08 MED ORDER — SCOPOLAMINE 1 MG/3DAYS TD PT72
MEDICATED_PATCH | TRANSDERMAL | Status: AC
  Filled 2015-01-08: qty 1

## 2015-01-08 MED ORDER — WITCH HAZEL-GLYCERIN EX PADS: 1.0000 "application " | MEDICATED_PAD | CUTANEOUS | Status: DC | PRN

## 2015-01-08 MED ORDER — NALBUPHINE HCL 10 MG/ML IJ SOLN: 5.0000 mg | INTRAMUSCULAR | Status: DC | PRN

## 2015-01-08 MED ORDER — LACTATED RINGERS IV SOLN
INTRAVENOUS | Status: DC | PRN
  Administered 2015-01-08 (×2): via INTRAVENOUS

## 2015-01-08 MED ORDER — LACTATED RINGERS IV SOLN: 500.0000 mL | INTRAVENOUS | Status: DC | PRN

## 2015-01-08 MED ORDER — LACTATED RINGERS IV SOLN
INTRAVENOUS | Status: DC
  Administered 2015-01-09: 01:00:00 via INTRAVENOUS

## 2015-01-08 MED ORDER — LIDOCAINE HCL (PF) 1 % IJ SOLN: 30.0000 mL | INTRAMUSCULAR | Status: DC | PRN

## 2015-01-08 MED ORDER — DIPHENHYDRAMINE HCL 25 MG PO CAPS: 25.0000 mg | ORAL_CAPSULE | ORAL | Status: DC | PRN

## 2015-01-08 MED ORDER — FENTANYL CITRATE (PF) 100 MCG/2ML IJ SOLN
INTRAMUSCULAR | Status: DC | PRN
  Administered 2015-01-08 (×2): 25 ug via INTRAVENOUS
  Administered 2015-01-08: 25 ug via INTRATHECAL
  Administered 2015-01-08: 25 ug via INTRAVENOUS

## 2015-01-08 MED ORDER — BUPIVACAINE HCL (PF) 0.5 % IJ SOLN
INTRAMUSCULAR | Status: AC
  Filled 2015-01-08: qty 30

## 2015-01-08 MED ORDER — OXYTOCIN 40 UNITS IN LACTATED RINGERS INFUSION - SIMPLE MED
INTRAVENOUS | Status: DC | PRN
  Administered 2015-01-08: 40 [IU] via INTRAVENOUS

## 2015-01-08 MED ORDER — BUPIVACAINE HCL (PF) 0.5 % IJ SOLN
INTRAMUSCULAR | Status: DC | PRN
  Administered 2015-01-08: 30 mL

## 2015-01-08 MED ORDER — CITRIC ACID-SODIUM CITRATE 334-500 MG/5ML PO SOLN
30.0000 mL | ORAL | Status: DC | PRN
  Administered 2015-01-08: 30 mL via ORAL
  Filled 2015-01-08: qty 15

## 2015-01-08 MED ORDER — PRENATAL MULTIVITAMIN CH
1.0000 | ORAL_TABLET | Freq: Every day | ORAL | Status: DC
  Administered 2015-01-09: 1 via ORAL
  Filled 2015-01-08: qty 1

## 2015-01-08 MED ORDER — BUPIVACAINE IN DEXTROSE 0.75-8.25 % IT SOLN
INTRATHECAL | Status: DC | PRN
  Administered 2015-01-08: 1.5 mL via INTRATHECAL

## 2015-01-08 MED ORDER — DIBUCAINE 1 % RE OINT: 1.0000 "application " | TOPICAL_OINTMENT | RECTAL | Status: DC | PRN

## 2015-01-08 MED ORDER — FENTANYL CITRATE (PF) 100 MCG/2ML IJ SOLN
25.0000 ug | INTRAMUSCULAR | Status: DC | PRN
  Administered 2015-01-08: 50 ug via INTRAVENOUS
  Administered 2015-01-08: 25 ug via INTRAVENOUS

## 2015-01-08 MED ORDER — ONDANSETRON HCL 4 MG/2ML IJ SOLN
INTRAMUSCULAR | Status: DC | PRN
  Administered 2015-01-08: 4 mg via INTRAVENOUS

## 2015-01-08 MED ORDER — SIMETHICONE 80 MG PO CHEW: 80.0000 mg | CHEWABLE_TABLET | ORAL | Status: DC | PRN

## 2015-01-08 MED ORDER — 0.9 % SODIUM CHLORIDE (POUR BTL) OPTIME
TOPICAL | Status: DC | PRN
  Administered 2015-01-08: 1000 mL

## 2015-01-08 MED ORDER — OXYCODONE-ACETAMINOPHEN 5-325 MG PO TABS: 1.0000 | ORAL_TABLET | ORAL | Status: DC | PRN

## 2015-01-08 MED ORDER — MENTHOL 3 MG MT LOZG: 1.0000 | LOZENGE | OROMUCOSAL | Status: DC | PRN

## 2015-01-08 MED ORDER — ZOLPIDEM TARTRATE 5 MG PO TABS: 5.0000 mg | ORAL_TABLET | Freq: Every evening | ORAL | Status: DC | PRN

## 2015-01-08 MED ORDER — OXYCODONE-ACETAMINOPHEN 5-325 MG PO TABS: 2.0000 | ORAL_TABLET | ORAL | Status: DC | PRN

## 2015-01-08 MED ORDER — FENTANYL CITRATE (PF) 100 MCG/2ML IJ SOLN
INTRAMUSCULAR | Status: AC
  Administered 2015-01-08: 25 ug via INTRAVENOUS
  Filled 2015-01-08: qty 2

## 2015-01-08 MED ORDER — NALBUPHINE HCL 10 MG/ML IJ SOLN: 5.0000 mg | Freq: Once | INTRAMUSCULAR | Status: DC | PRN

## 2015-01-08 MED ORDER — SODIUM CHLORIDE 0.9 % IJ SOLN: 3.0000 mL | INTRAMUSCULAR | Status: DC | PRN

## 2015-01-08 MED ORDER — LANOLIN HYDROUS EX OINT: 1.0000 "application " | TOPICAL_OINTMENT | CUTANEOUS | Status: DC | PRN

## 2015-01-08 MED ORDER — ACETAMINOPHEN 325 MG PO TABS: 650.0000 mg | ORAL_TABLET | ORAL | Status: DC | PRN

## 2015-01-08 MED ORDER — PHENYLEPHRINE 8 MG IN D5W 100 ML (0.08MG/ML) PREMIX OPTIME
INJECTION | INTRAVENOUS | Status: AC
  Filled 2015-01-08: qty 100

## 2015-01-08 MED ORDER — MEASLES, MUMPS & RUBELLA VAC ~~LOC~~ INJ: 0.5000 mL | INJECTION | Freq: Once | SUBCUTANEOUS | Status: DC

## 2015-01-08 MED ORDER — ONDANSETRON HCL 4 MG/2ML IJ SOLN
INTRAMUSCULAR | Status: AC
  Filled 2015-01-08: qty 2

## 2015-01-08 MED ORDER — SCOPOLAMINE 1 MG/3DAYS TD PT72
MEDICATED_PATCH | TRANSDERMAL | Status: DC | PRN
  Administered 2015-01-08: 1 via TRANSDERMAL

## 2015-01-08 MED ORDER — DEXAMETHASONE SODIUM PHOSPHATE 4 MG/ML IJ SOLN
INTRAMUSCULAR | Status: AC
  Filled 2015-01-08: qty 1

## 2015-01-08 MED ORDER — FENTANYL CITRATE (PF) 100 MCG/2ML IJ SOLN
INTRAMUSCULAR | Status: AC
  Filled 2015-01-08: qty 4

## 2015-01-08 MED ORDER — MORPHINE SULFATE (PF) 0.5 MG/ML IJ SOLN
INTRAMUSCULAR | Status: DC | PRN
  Administered 2015-01-08: .35 mg via INTRAVENOUS
  Administered 2015-01-08: .15 mg via INTRATHECAL

## 2015-01-08 MED ORDER — PHENYLEPHRINE 8 MG IN D5W 100 ML (0.08MG/ML) PREMIX OPTIME
INJECTION | INTRAVENOUS | Status: DC | PRN
  Administered 2015-01-08: 60 ug/min via INTRAVENOUS

## 2015-01-08 MED ORDER — PHENYLEPHRINE 40 MCG/ML (10ML) SYRINGE FOR IV PUSH (FOR BLOOD PRESSURE SUPPORT)
PREFILLED_SYRINGE | INTRAVENOUS | Status: AC
  Filled 2015-01-08: qty 10

## 2015-01-08 MED ORDER — SERTRALINE HCL 50 MG PO TABS
50.0000 mg | ORAL_TABLET | Freq: Every day | ORAL | Status: DC
  Filled 2015-01-08 (×2): qty 1

## 2015-01-08 MED ORDER — CEFAZOLIN SODIUM-DEXTROSE 2-3 GM-% IV SOLR
INTRAVENOUS | Status: AC
  Filled 2015-01-08: qty 50

## 2015-01-08 MED ORDER — OXYTOCIN 40 UNITS IN LACTATED RINGERS INFUSION - SIMPLE MED
62.5000 mL/h | INTRAVENOUS | Status: DC
Start: 2015-01-08 — End: 2015-01-08

## 2015-01-08 MED ORDER — IBUPROFEN 600 MG PO TABS: 600.0000 mg | ORAL_TABLET | Freq: Four times a day (QID) | ORAL | Status: DC | PRN

## 2015-01-08 MED ORDER — KETOROLAC TROMETHAMINE 30 MG/ML IJ SOLN
30.0000 mg | Freq: Four times a day (QID) | INTRAMUSCULAR | Status: AC | PRN
  Administered 2015-01-08: 30 mg via INTRAMUSCULAR

## 2015-01-08 MED ORDER — MAGNESIUM HYDROXIDE 400 MG/5ML PO SUSP: 30.0000 mL | ORAL | Status: DC | PRN

## 2015-01-08 MED ORDER — SCOPOLAMINE 1 MG/3DAYS TD PT72: 1.0000 | MEDICATED_PATCH | Freq: Once | TRANSDERMAL | Status: DC

## 2015-01-08 MED ORDER — LACTATED RINGERS IV SOLN
INTRAVENOUS | Status: DC | PRN
  Administered 2015-01-08: 16:00:00 via INTRAVENOUS

## 2015-01-08 MED ORDER — CEFAZOLIN SODIUM-DEXTROSE 2-3 GM-% IV SOLR
INTRAVENOUS | Status: DC | PRN
  Administered 2015-01-08: 2 g via INTRAVENOUS

## 2015-01-08 MED ORDER — OXYTOCIN BOLUS FROM INFUSION
500.0000 mL | INTRAVENOUS | Status: DC
Start: 2015-01-08 — End: 2015-01-08

## 2015-01-08 MED ORDER — ONDANSETRON HCL 4 MG/2ML IJ SOLN
4.0000 mg | Freq: Three times a day (TID) | INTRAMUSCULAR | Status: DC | PRN
  Filled 2015-01-08: qty 2

## 2015-01-08 MED ORDER — NALOXONE HCL 1 MG/ML IJ SOLN: 1.0000 ug/kg/h | INTRAVENOUS | Status: DC | PRN

## 2015-01-08 MED ORDER — DIPHENHYDRAMINE HCL 50 MG/ML IJ SOLN
12.5000 mg | INTRAMUSCULAR | Status: DC | PRN
Start: 2015-01-08 — End: 2015-01-10

## 2015-01-08 MED ORDER — IBUPROFEN 600 MG PO TABS
600.0000 mg | ORAL_TABLET | Freq: Four times a day (QID) | ORAL | Status: DC
  Administered 2015-01-09 – 2015-01-10 (×4): 600 mg via ORAL
  Filled 2015-01-08 (×5): qty 1

## 2015-01-08 MED ORDER — NALOXONE HCL 0.4 MG/ML IJ SOLN: 0.4000 mg | INTRAMUSCULAR | Status: DC | PRN

## 2015-01-08 MED ORDER — OXYCODONE-ACETAMINOPHEN 5-325 MG PO TABS
1.0000 | ORAL_TABLET | ORAL | Status: DC | PRN
  Administered 2015-01-09 – 2015-01-10 (×3): 1 via ORAL
  Filled 2015-01-08 (×4): qty 1

## 2015-01-08 MED ORDER — SENNOSIDES-DOCUSATE SODIUM 8.6-50 MG PO TABS
2.0000 | ORAL_TABLET | ORAL | Status: DC
  Administered 2015-01-09: 2 via ORAL
  Filled 2015-01-08: qty 2

## 2015-01-08 MED ORDER — MORPHINE SULFATE 0.5 MG/ML IJ SOLN
INTRAMUSCULAR | Status: AC
  Filled 2015-01-08: qty 100

## 2015-01-08 MED ORDER — ACETAMINOPHEN 500 MG PO TABS: 1000.0000 mg | ORAL_TABLET | Freq: Once | ORAL | Status: DC

## 2015-01-08 SURGICAL SUPPLY — 38 items
BENZOIN TINCTURE PRP APPL 2/3 (GAUZE/BANDAGES/DRESSINGS) ×3 IMPLANT
CLAMP CORD UMBIL (MISCELLANEOUS) ×3 IMPLANT
CLOSURE WOUND 1/2 X4 (GAUZE/BANDAGES/DRESSINGS) ×1
CLOTH BEACON ORANGE TIMEOUT ST (SAFETY) ×3 IMPLANT
DRAPE SHEET LG 3/4 BI-LAMINATE (DRAPES) ×3 IMPLANT
DRSG OPSITE POSTOP 4X10 (GAUZE/BANDAGES/DRESSINGS) ×3 IMPLANT
DRSG TELFA 3X8 NADH (GAUZE/BANDAGES/DRESSINGS) ×3 IMPLANT
DURAPREP 26ML APPLICATOR (WOUND CARE) ×3 IMPLANT
ELECT REM PT RETURN 9FT ADLT (ELECTROSURGICAL) ×3
ELECTRODE REM PT RTRN 9FT ADLT (ELECTROSURGICAL) ×1 IMPLANT
EXTRACTOR VACUUM M CUP 4 TUBE (SUCTIONS) IMPLANT
EXTRACTOR VACUUM M CUP 4' TUBE (SUCTIONS)
GLOVE BIOGEL PI IND STRL 7.0 (GLOVE) ×2 IMPLANT
GLOVE BIOGEL PI INDICATOR 7.0 (GLOVE) ×4
GLOVE ECLIPSE 7.0 STRL STRAW (GLOVE) ×3 IMPLANT
GOWN STRL REUS W/TWL LRG LVL3 (GOWN DISPOSABLE) ×18 IMPLANT
KIT ABG SYR 3ML LUER SLIP (SYRINGE) IMPLANT
NEEDLE HYPO 22GX1.5 SAFETY (NEEDLE) ×3 IMPLANT
NEEDLE HYPO 25X5/8 SAFETYGLIDE (NEEDLE) ×3 IMPLANT
NS IRRIG 1000ML POUR BTL (IV SOLUTION) ×3 IMPLANT
PACK C SECTION WH (CUSTOM PROCEDURE TRAY) ×3 IMPLANT
PAD ABD 7.5X8 STRL (GAUZE/BANDAGES/DRESSINGS) ×3 IMPLANT
PAD ABD 8X7 1/2 STERILE (GAUZE/BANDAGES/DRESSINGS) ×3 IMPLANT
PAD OB MATERNITY 4.3X12.25 (PERSONAL CARE ITEMS) ×3 IMPLANT
PENCIL SMOKE EVAC W/HOLSTER (ELECTROSURGICAL) ×3 IMPLANT
RTRCTR C-SECT PINK 25CM LRG (MISCELLANEOUS) ×3 IMPLANT
SPONGE GAUZE 4X4 12PLY STER LF (GAUZE/BANDAGES/DRESSINGS) ×3 IMPLANT
STRIP CLOSURE SKIN 1/2X4 (GAUZE/BANDAGES/DRESSINGS) ×2 IMPLANT
SUT PDS AB 0 CT1 27 (SUTURE) IMPLANT
SUT PDS AB 0 CTX 36 PDP370T (SUTURE) IMPLANT
SUT PLAIN 2 0 XLH (SUTURE) IMPLANT
SUT VIC AB 0 CT1 36 (SUTURE) ×6 IMPLANT
SUT VIC AB 0 CTX 36 (SUTURE) ×4
SUT VIC AB 0 CTX36XBRD ANBCTRL (SUTURE) ×2 IMPLANT
SUT VIC AB 4-0 KS 27 (SUTURE) ×3 IMPLANT
SYR CONTROL 10ML LL (SYRINGE) ×3 IMPLANT
TOWEL OR 17X24 6PK STRL BLUE (TOWEL DISPOSABLE) ×3 IMPLANT
TRAY FOLEY CATH SILVER 14FR (SET/KITS/TRAYS/PACK) ×3 IMPLANT

## 2015-01-08 NOTE — H&P (Signed)
[name] Young is a 24 y.o. female G13P0101 @ 37.5wks presenting for what she thought was a scheduled C/S @ midnight due to IUGR. She had actually been scheduled for IOL at midnight on 9/7 but the pt cancelled due to child care.  Denies ctx, pain, bldg, or leaking. Her preg has been remarkable for 1) hx IOL for pre-e and prev C/S 2) late onset of care  3) depression- on Zoloft 4) BUFA 5) symmetric IUGR >10% w/ rec for delivery 37-38wks. Per the prenatal record, the pt has wavered between rLTCS and TOLAC, but currently she adamantly wants rLTCS.  History OB History    Gravida Para Term Preterm AB TAB SAB Ectopic Multiple Living   0   0  1     Past Medical History  Diagnosis Date  . Medical history non-contributory   . Pregnancy induced hypertension   . Neuromuscular disorder    Past Surgical History  Procedure Laterality Date  . No past surgeries    . Cesarean section N/A 02/17/2014    Procedure: CESAREAN SECTION;  Surgeon: Tereso Newcomer, MD;  Location: [facility];  Service: Obstetrics;  Laterality: N/A;   Family History: family history includes Hypertension in her father and mother. Social History:  reports that she has never smoked. She has never used smokeless tobacco. She reports that she does not drink alcohol or use illicit drugs.   Prenatal Transfer Tool  Maternal Diabetes: No Genetic Screening: Declined- too late Maternal Ultrasounds/Referrals: Abnormal:  Findings:   IUGR Fetal Ultrasounds or other Referrals:  Referred to Materal Fetal Medicine  Maternal Substance Abuse:  No Significant Maternal Medications:  Meds include: Zoloft Significant Maternal Lab Results:  Lab values include: Group B Strep negative Other Comments:  None  ROS    Blood pressure 124/83, pulse 67, temperature 98.1 F (36.7 C), temperature source Oral, last menstrual period 04/19/2014, unknown if currently breastfeeding. Exam Physical Exam  Constitutional: She is oriented to person, place,  and time. She appears well-developed.  HENT:  Head: Normocephalic.  Neck: Normal range of motion.  Cardiovascular: Normal rate.   Respiratory: Effort normal.  GI:  EFM 130s, 10x10accels, occ mi variable Periods of reg ctx per toco but pt denies pain Neg CST  Musculoskeletal: Normal range of motion.  Neurological: She is alert and oriented to person, place, and time.  Skin: Skin is warm and dry.  Psychiatric: She has a normal mood and affect. Her behavior is normal. Thought content normal.    Prenatal labs: ABO, Rh: B/NEG/-- (08/24 1424) Antibody: NEG (08/24 1424) Rubella: 0.82 (08/24 1424) RPR: NON REAC (08/24 1424)  HBsAg: NEGATIVE (08/24 1424)  HIV: NONREACTIVE (08/24 1424)  GBS: Negative (08/24 0000)   Assessment/Plan: IUP@37 .5wks For rLTCS @ 3pm EFM with 10x10accels and occ variables, however CST neg  Intention was for pt to go home and then return at 1pm for prep for OR, but with fetal tracing it was recommended that pt stay overnight. She will be NPO @ 5am.   Devaunte Gasparini CNM 01/08/2015, 2:36 AM

## 2015-01-08 NOTE — Progress Notes (Signed)
Patient ID: [name] Young, female   DOB: 1990/07/13, 24 y.o.   MRN: [id number]  Called to speak with patient re: possibility of converting to Trial of Labor  Patient scheduled for C/S this afternoon but states she may want to do a TOLAC "if it is easier"  I explained that post delivery recovery would be easier after vaginal birth, but that the process of birth would take longer. Would necessitate Foley and Pitocin and time involved to dilate  Also explained there is a risk of fetal intolerance of labor, which would necessitate C/S.  Unclear whether she labored much last pregnancy prior to C/S  She states she wants to talk it over with husband and let us know.

## 2015-01-08 NOTE — Transfer of Care (Signed)
Immediate Anesthesia Transfer of Care Note  Patient: [name] Young  Procedure(s) Performed: Procedure(s): CESAREAN SECTION (N/A)  Patient Location: PACU  Anesthesia Type:Spinal  Level of Consciousness: awake, alert  and oriented  Airway & Oxygen Therapy: Patient Spontanous Breathing  Post-op Assessment: Report given to RN and Post -op Vital signs reviewed and stable  Post vital signs: Reviewed and stable  Last Vitals:  Filed Vitals:   01/08/15 0810  BP: 103/48  Pulse: 62  Temp: 36.9 C  Resp: 18    Complications: No apparent anesthesia complications

## 2015-01-08 NOTE — Anesthesia Postprocedure Evaluation (Signed)
  Anesthesia Post-op Note  Patient: [name] Young  Procedure(s) Performed: Procedure(s): CESAREAN SECTION (N/A)  Patient Location: PACU  Anesthesia Type:Spinal  Level of Consciousness: awake, alert  and oriented  Airway and Oxygen Therapy: Patient Spontanous Breathing  Post-op Pain: none  Post-op Assessment: Post-op Vital signs reviewed, Patient's Cardiovascular Status Stable, Respiratory Function Stable, Patent Airway, No signs of Nausea or vomiting, Pain level controlled, No headache, No backache, Spinal receding and Patient able to bend at knees LLE Motor Response: Non-purposeful movement LLE Sensation: Numbness RLE Motor Response: Non-purposeful movement RLE Sensation: Numbness L Sensory Level: L2-Upper inner thigh, upper buttock R Sensory Level: L2-Upper inner thigh, upper buttock  Post-op Vital Signs: Reviewed and stable  Last Vitals:  Filed Vitals:   01/08/15 1730  BP: 113/70  Pulse: 48  Temp:   Resp: 14    Complications: No apparent anesthesia complications

## 2015-01-08 NOTE — Plan of Care (Signed)
Problem: Phase I Progression Outcomes Goal: VS, stable, temp < 100.4 degrees F Outcome: Completed/Met Date Met:  01/08/15 Has a Brady heart rate.

## 2015-01-08 NOTE — Progress Notes (Signed)
Patient ID: [name] Young, female   DOB: 25-Nov-1990, 24 y.o.   MRN: [id number] Awaiting C/S  Filed Vitals:   01/08/15 0130 01/08/15 0730 01/08/15 0810  BP: 124/83  103/48  Pulse: 67  62  Temp: 98.1 F (36.7 C)  98.5 F (36.9 C)  TempSrc: Oral  Oral  Resp:  20 18  Height:  (1.6 m)    Weight: 146 lb (66.[name]    FHR reactive with no decels Irregular contractions  Aviva Signs, CNM

## 2015-01-08 NOTE — Op Note (Signed)
[name] Young PROCEDURE DATE: 01/08/2015  PREOPERATIVE DIAGNOSES: Intrauterine pregnancy at [redacted]w[redacted]d weeks gestation; IUGR; previous cesarean section; declines TOLAC; baby being put up for adoption  POSTOPERATIVE DIAGNOSES: The same  PROCEDURE: Repeat Low Transverse Cesarean Section  SURGEON:  Dr. Jaynie Collins  ASSISTANT:  Dr. Shonna Chock; Marylou Flesher MSIII  ANESTHESIOLOGIST: Dr. Mal Amabile  INDICATIONS: [name] Young is a 24 y.o. [id number] at [redacted]w[redacted]d here for cesarean section secondary to the indications listed under preoperative diagnoses; please see preoperative note for further details.  The risks of cesarean section were discussed with the patient including but were not limited to: bleeding which may require transfusion or reoperation; infection which may require antibiotics; injury to bowel, bladder, ureters or other surrounding organs; injury to the fetus; need for additional procedures including hysterectomy in the event of a life-threatening hemorrhage; placental abnormalities wth subsequent pregnancies, incisional problems, thromboembolic phenomenon and other postoperative/anesthesia complications.   The patient concurred with the proposed plan, giving informed written consent for the procedure.    FINDINGS:  Viable female infant in cephalic presentation.  Apgars 9 and 9.  Clear amniotic fluid.  Intact placenta, three vessel cord.  Normal uterus, fallopian tubes and ovaries bilaterally. Minimal adhesive intraperitoneal disease.  ANESTHESIA: Spinal INTRAVENOUS FLUIDS: 1700 ml ESTIMATED BLOOD LOSS: 650 ml URINE OUTPUT:  150 ml SPECIMENS: Placenta sent to pathology COMPLICATIONS: None immediate  PROCEDURE IN DETAIL:  The patient preoperatively received intravenous antibiotics and had sequential compression devices applied to her lower extremities.  She was then taken to the operating room where spinal anesthesia was administered and was found to be adequate. She was then placed in a  dorsal supine position with a leftward tilt, and prepped and draped in a sterile manner.  A foley catheter was placed into her bladder and attached to constant gravity.  After an adequate timeout was performed, an elliptical incision was made around her preexisting scar to remove the keloid scar.  The incision was then carried through to the underlying layer of fascia. The fascia was incised in the midline, and this incision was extended bilaterally using the Mayo scissors.  Kocher clamps were applied to the superior aspect of the fascial incision and the underlying rectus muscles were dissected off sharply and bluntly.  A similar process was carried out on the inferior aspect of the fascial incision. The rectus muscles were separated in the midline bluntly and the peritoneum was entered bluntly. Attention was turned to the lower uterine segment where a low transverse hysterotomy was made with a scalpel and extended bilaterally bluntly.  The infant was successfully delivered, the cord was clamped and cut after one minute, and the infant was handed over to the awaiting neonatology team. Uterine massage was then administered, and the placenta delivered intact with a three-vessel cord. The uterus was then cleared of clot and debris.  The hysterotomy was closed with 0 Vicryl in a running locked fashion, and an imbricating layer was also placed with 0 Vicryl.  Figure-of-eight 0 Vicryl serosal stitches were placed to help with hemostasis.  The pelvis was cleared of all clot and debris. Hemostasis was confirmed on all surfaces.  The peritoneum was reapproximated using 0 Vicryl running stitches. The fascia was then closed using 0 Vicryl in a running fashion.  The subcutaneous layer was irrigated, and 30 ml of 0.5% Marcaine was injected subcutaneously around the incision.  The skin was closed with a 4-0 Vicryl subcuticular stitch. The patient tolerated the procedure well. Sponge, lap, instrument and  needle counts were  correct x 3.  She was taken to the recovery room in stable condition.    Jaynie Collins, MD, FACOG Attending Obstetrician & Gynecologist Faculty Practice, [facility]

## 2015-01-08 NOTE — Plan of Care (Signed)
Problem: Phase I Progression Outcomes Goal: Voiding adequately Outcome: Not Applicable Date Met:  40/98/11 Foley to straight drain. Goal: Foley catheter patent Outcome: Completed/Met Date Met:  01/08/15 Clear yellow urine. Goal: IS, TCDB as ordered Outcome: Completed/Met Date Met:  01/08/15 Can get I/S up to 1750-2250.Non productive cough. Goal: Initial discharge plan identified Outcome: Completed/Met Date Met:  01/08/15 Pain controlled on po meds. Bleeding WNL Incision has no signs of infection Understands self care Understands f/u care Goal: Other Phase I Outcomes/Goals Outcome: Not Progressing Tolerate clear liquids.

## 2015-01-08 NOTE — Anesthesia Procedure Notes (Signed)
Spinal Patient location during procedure: OR Start time: 01/08/2015 3:08 PM Staffing Anesthesiologist: Mal Amabile Performed by: anesthesiologist  Preanesthetic Checklist Completed: patient identified, site marked, surgical consent, pre-op evaluation, timeout performed, IV checked, risks and benefits discussed and monitors and equipment checked Spinal Block Patient position: sitting Prep: site prepped and draped and DuraPrep Patient monitoring: heart rate, cardiac monitor, continuous pulse ox and blood pressure Approach: midline Location: L3-4 Injection technique: single-shot Needle Needle type: Sprotte  Needle gauge: 24 G Needle length: 9 cm Assessment Sensory level: T4 Additional Notes Patient tolerated procedure well. Adequate sensory level.

## 2015-01-08 NOTE — Progress Notes (Signed)
Faculty Practice OB/GYN Attending Note  Subjective:  Called to evaluate patient who is deciding between [facility] and RCS.  She now definitively wants RCS.  FHR reassuring, no frequent painful contractions, no LOF or vaginal bleeding. Good FM.   Admitted on 01/08/2015 for Symmetric IUGR, previous cesarean section.   Objective:  Blood pressure 103/48, pulse 62, temperature 98.5 F (36.9 C), temperature source Oral, resp. rate 18, height  (1.6 m), weight 146 lb (66.225 kg), last menstrual period 04/19/2014, unknown if currently breastfeeding. FHT  Baseline 133 bpm, moderate variability, +accelerations, no decelerations Toco: q 3-8 min Gen: NAD Abdomen: NT, gravid fundus, soft Cervix: Deferred Ext: 2+ DTRs, no edema, no cyanosis, negative Homan's sign  Assessment & Plan:  24 y.o. G2P0101 at [redacted]w[redacted]d admitted for IOL/TOLAC for IUGR; now wants RCS.  The risks of repeat cesarean section discussed with the patient included but were not limited to: bleeding which may require transfusion or reoperation; infection which may require antibiotics; injury to bowel, bladder, ureters or other surrounding organs; injury to the fetus; need for additional procedures including hysterectomy in the event of a life-threatening hemorrhage; placental abnormalities wth subsequent pregnancies, incisional problems, thromboembolic phenomenon and other postoperative/anesthesia complications. The patient concurred with the proposed plan, giving informed written consent for the procedure.   Patient has been NPO since last night she will remain NPO for procedure. Anesthesia and OR aware. Preoperative prophylactic antibiotics and SCDs ordered on call to the OR.  To OR when ready.    Jaynie Collins, MD, FACOG Attending Obstetrician & Gynecologist Faculty Practice, [facility]

## 2015-01-08 NOTE — Anesthesia Preprocedure Evaluation (Signed)
Anesthesia Evaluation  Patient identified by MRN, date of birth, ID band Patient awake    Reviewed: Allergy & Precautions, Patient's Chart, lab work & pertinent test results  Airway Mallampati: II  TM Distance: >3 FB Neck ROM: Full    Dental no notable dental hx. (+) Teeth Intact   Pulmonary neg pulmonary ROS,    Pulmonary exam normal breath sounds clear to auscultation       Cardiovascular hypertension, Normal cardiovascular exam Rhythm:Regular Rate:Normal     Neuro/Psych PSYCHIATRIC DISORDERS Depression negative neurological ROS     GI/Hepatic   Endo/Other    Renal/GU   negative genitourinary   Musculoskeletal negative musculoskeletal ROS (+)   Abdominal   Peds  Hematology  (+) anemia ,   Anesthesia Other Findings   Reproductive/Obstetrics (+) Pregnancy Previous C/Section PIH                             Anesthesia Physical Anesthesia Plan  ASA: II  Anesthesia Plan: Spinal   Post-op Pain Management:    Induction:   Airway Management Planned: Natural Airway  Additional Equipment:   Intra-op Plan:   Post-operative Plan:   Informed Consent: I have reviewed the patients History and Physical, chart, labs and discussed the procedure including the risks, benefits and alternatives for the proposed anesthesia with the patient or authorized representative who has indicated his/her understanding and acceptance.     Plan Discussed with: Anesthesiologist, CRNA and Surgeon  Anesthesia Plan Comments:         Anesthesia Quick Evaluation

## 2015-01-09 LAB — CBC
HCT: 27.3 % — ABNORMAL LOW (ref 36.0–46.0)
Hemoglobin: 8.4 g/dL — ABNORMAL LOW (ref 12.0–15.0)
MCH: 23 pg — AB (ref 26.0–34.0)
MCHC: 30.8 g/dL (ref 30.0–36.0)
MCV: 74.6 fL — AB (ref 78.0–100.0)
PLATELETS: 258 10*3/uL (ref 150–400)
RBC: 3.66 MIL/uL — ABNORMAL LOW (ref 3.87–5.11)
RDW: 15.7 % — AB (ref 11.5–15.5)
WBC: 13.2 10*3/uL — ABNORMAL HIGH (ref 4.0–10.5)

## 2015-01-09 MED ORDER — RHO D IMMUNE GLOBULIN 1500 UNIT/2ML IJ SOSY
300.0000 ug | PREFILLED_SYRINGE | Freq: Once | INTRAMUSCULAR | Status: AC
  Administered 2015-01-09: 300 ug via INTRAMUSCULAR
  Filled 2015-01-09: qty 2

## 2015-01-09 NOTE — Anesthesia Postprocedure Evaluation (Signed)
Anesthesia Post Note  Patient: [name] Young  Procedure(s) Performed: Procedure(s) (LRB): CESAREAN SECTION (N/A)  Anesthesia type: Spinal  Patient location: Mother/Baby  Post pain: Pain level controlled  Post assessment: Post-op Vital signs reviewed  Last Vitals:  Filed Vitals:   01/09/15 1018  BP: 126/94  Pulse: 110  Temp: 36.7 C  Resp: 18    Post vital signs: Reviewed  Level of consciousness: awake  Complications: No apparent anesthesia complications

## 2015-01-09 NOTE — Plan of Care (Signed)
Problem: Phase I Progression Outcomes Goal: OOB as tolerated unless otherwise ordered Outcome: Completed/Met Date Met:  01/09/15 Walked to bathroom for peri care and tolerated well.

## 2015-01-09 NOTE — Progress Notes (Signed)
Patient refused MMR, Tdap and Flu vaccines.

## 2015-01-09 NOTE — Progress Notes (Signed)
Post Partum Day 1/POD#1 Subjective:  [name] Young is a 24 y.o. [id number] 42w5ds/p rLTCS.  No acute events overnight.  Pt denies problems with ambulating, voiding or po intake.  She denies nausea or vomiting.  Pain is well controlled.  She has had flatus. She has not had bowel movement.  Lochia Small.  Plan for birth control is Depo-Provera.  Method of Feeding: n/a- baby up for adoption  Objective: Blood pressure 109/62, pulse 60, temperature 98.2 F (36.8 C), temperature source Oral, resp. rate 18, height _0  (1.6 m), weight 146 lb (66.225 kg), last menstrual period 04/19/2014, SpO2 100 %, unknown if currently breastfeeding.  Physical Exam:  General: alert, cooperative and no distress Lochia:normal flow Chest: CTAB Heart: regular rate Abdomen: +BS, soft, nontender,  Uterine Fundus: firm Incision: clean/dry/intact DVT Evaluation: No evidence of DVT seen on physical exam. Extremities: no edema   Recent Labs  01/08/15 0300 01/09/15 0525  HGB 9.4* 8.4*  HCT 30.6* 27.3*   Assessment/Plan: ASSESSMENT: DLeota Makais a 24y.o. [id number]/p rLTCS  Plan for discharge tomorrow Routine postpartum care MMR ordered Will need pp Rhogam prior to discharge- ordered    LOS: 1 day   [name] Young/01/2015, 3:33 PM

## 2015-01-09 NOTE — Progress Notes (Signed)
[name] Young and [name] Young with [employer] met with parents and relinquishment paperwork was signed.   Informed that the adoptive parents reside in another states and are expected to arrive tomorrow around 1 am.  They plan to visit with newborn at the hospital tomorrow at 10:30am along with a representative from [employer].   Copy of relinquishment paperwork place in newborn's chart.  Spoke with the Attending and informed that newborn may be ready for discharge on Monday.

## 2015-01-09 NOTE — Plan of Care (Signed)
Problem: Phase II Progression Outcomes Goal: Progress activity as tolerated unless otherwise ordered Outcome: Completed/Met Date Met:  01/09/15 Tolerated walk to bathroom well.

## 2015-01-09 NOTE — Addendum Note (Signed)
Addendum  created 01/09/15 1326 by Algis Greenhouse, CRNA   Modules edited: Notes Section   Notes Section:  File: [id number]

## 2015-01-10 ENCOUNTER — Encounter (HOSPITAL_COMMUNITY)
Admission: RE | Admit: 2015-01-10 | Discharge: 2015-01-10 | Disposition: A | Discharge status: Home / Self Care | Payer: Medicaid Other | Source: Ambulatory Visit | Attending: Obstetrics & Gynecology | Admitting: Obstetrics & Gynecology

## 2015-01-10 DIAGNOSIS — O923 Agalactia: Secondary | ICD-10-CM | POA: Insufficient documentation

## 2015-01-10 LAB — RH IG WORKUP (INCLUDES ABO/RH)
ABO/RH(D): B NEG
Fetal Screen: NEGATIVE
Gestational Age(Wks): 37.5
Unit division: 0

## 2015-01-10 MED ORDER — DOCUSATE SODIUM 100 MG PO CAPS: 100.0000 mg | ORAL_CAPSULE | Freq: Two times a day (BID) | ORAL | Status: DC | PRN

## 2015-01-10 MED ORDER — IBUPROFEN 600 MG PO TABS: 600.0000 mg | ORAL_TABLET | Freq: Four times a day (QID) | ORAL | Status: DC

## 2015-01-10 MED ORDER — OXYCODONE-ACETAMINOPHEN 5-325 MG PO TABS: 1.0000 | ORAL_TABLET | ORAL | Status: DC | PRN

## 2015-01-10 MED ORDER — MEDROXYPROGESTERONE ACETATE 150 MG/ML IM SUSP
150.0000 mg | Freq: Once | INTRAMUSCULAR | Status: AC
  Administered 2015-01-10: 150 mg via INTRAMUSCULAR
  Filled 2015-01-10: qty 1

## 2015-01-10 NOTE — Discharge Summary (Signed)
Obstetric Discharge Summary Reason for Admission: cesarean section Prenatal Procedures: NST Intrapartum Procedures: cesarean: low cervical, transverse Postpartum Procedures: none Complications-Operative and Postpartum: none HEMOGLOBIN  Date Value Ref Range Status  01/09/2015 8.4* 12.0 - 15.0 g/dL Final   HCT  Date Value Ref Range Status  01/09/2015 27.3* 36.0 - 46.0 % Final   Operative Note: LOW TRANSVERSE C-SECTION PROCEDURE IN DETAIL: The patient preoperatively received intravenous antibiotics and had sequential compression devices applied to her lower extremities. She was then taken to the operating room where spinal anesthesia was administered and was found to be adequate. She was then placed in a dorsal supine position with a leftward tilt, and prepped and draped in a sterile manner. A foley catheter was placed into her bladder and attached to constant gravity. After an adequate timeout was performed, an elliptical incision was made around her preexisting scar to remove the keloid scar. The incision was then carried through to the underlying layer of fascia. The fascia was incised in the midline, and this incision was extended bilaterally using the Mayo scissors. Kocher clamps were applied to the superior aspect of the fascial incision and the underlying rectus muscles were dissected off sharply and bluntly. A similar process was carried out on the inferior aspect of the fascial incision. The rectus muscles were separated in the midline bluntly and the peritoneum was entered bluntly. Attention was turned to the lower uterine segment where a low transverse hysterotomy was made with a scalpel and extended bilaterally bluntly. The infant was successfully delivered, the cord was clamped and cut after one minute, and the infant was handed over to the awaiting neonatology team. Uterine massage was then administered, and the placenta delivered intact with a three-vessel cord. The uterus was  then cleared of clot and debris. The hysterotomy was closed with 0 Vicryl in a running locked fashion, and an imbricating layer was also placed with 0 Vicryl. Figure-of-eight 0 Vicryl serosal stitches were placed to help with hemostasis. The pelvis was cleared of all clot and debris. Hemostasis was confirmed on all surfaces. The peritoneum was reapproximated using 0 Vicryl running stitches. The fascia was then closed using 0 Vicryl in a running fashion. The subcutaneous layer was irrigated, and 30 ml of 0.5% Marcaine was injected subcutaneously around the incision. The skin was closed with a 4-0 Vicryl subcuticular stitch. The patient tolerated the procedure well. Sponge, lap, instrument and needle counts were correct x 3. She was taken to the recovery room in stable condition.   Patient had an uncomplicated postpartum course. She was tolerating eating and ambulation well. Pain was well controlled at the time of discharged. The infant is being given up for adoption which complicates her postpartum course from a social perspective. She will have a visit with the visiting nurse for wound check and mood check as the patient has a history of depression.    Physical Exam: BP 121/78 mmHg  Pulse 50  Temp(Src) 98.6 F (37 C) (Oral)  Resp 18  Ht 5\' 3"  (1.6 m)  Wt 146 lb (66.225 kg)  BMI 25.87 kg/m2  SpO2 100%  LMP 04/19/2014  Breastfeeding? Unknown   General: alert, cooperative and appears stated age Lochia: appropriate Uterine Fundus: firm Incision: healing well, no significant drainage, no dehiscence, no significant erythema DVT Evaluation: No evidence of DVT seen on physical exam.  Discharge Diagnoses: Term Pregnancy-delivered  Discharge Information: Date: 01/10/2015 Activity: pelvic rest Diet: routine Medications: PNV, Ibuprofen, Colace and Percocet Condition: stable Instructions: refer to  practice specific booklet Discharge to: home Follow-up Information    Follow up with [facility] Schedule an appointment as soon as possible for a visit in 5 weeks.   Specialty:  Obstetrics and Gynecology   Why:  postpartum care and starting contraception   Contact information:   [address] [place] [address] [phone]     Newborn Data: Live born female  Birth Weight: 5 lb 3.8 oz (2375 g) APGAR: 9, 9  Home with mother.  Isa Rankin [facility] 01/10/2015, 6:31 AM

## 2015-01-10 NOTE — Progress Notes (Signed)

## 2015-01-11 NOTE — Addendum Note (Signed)
Addendum  created 01/11/15 1617 by Randa Spike, CRNA   Modules edited: Charges VN

## 2015-01-11 NOTE — Addendum Note (Signed)
Addendum  created 01/11/15 1541 by Randa Spike, CRNA   Modules edited: Charges VN

## 2015-01-12 LAB — TYPE AND SCREEN
ABO/RH(D): B NEG
Antibody Screen: POSITIVE
DAT, IGG: NEGATIVE
Unit division: 0
Unit division: 0

## 2015-01-13 ENCOUNTER — Encounter: Payer: Medicaid Other | Admitting: Physician Assistant

## 2015-01-13 ENCOUNTER — Encounter (HOSPITAL_COMMUNITY): Payer: Self-pay | Admitting: Obstetrics & Gynecology

## 2015-01-24 ENCOUNTER — Inpatient Hospital Stay (HOSPITAL_COMMUNITY)
Admission: AD | Admit: 2015-01-24 | Discharge: 2015-01-24 | Disposition: A | Discharge status: Home / Self Care | Payer: Medicaid Other | Source: Ambulatory Visit | Attending: Obstetrics & Gynecology | Admitting: Obstetrics & Gynecology

## 2015-01-24 ENCOUNTER — Encounter (HOSPITAL_COMMUNITY): Payer: Self-pay | Admitting: *Deleted

## 2015-01-24 DIAGNOSIS — T814XXA Infection following a procedure, initial encounter: Secondary | ICD-10-CM

## 2015-01-24 DIAGNOSIS — IMO0001 Reserved for inherently not codable concepts without codable children: Secondary | ICD-10-CM

## 2015-01-24 DIAGNOSIS — O860 Infection of obstetric surgical wound: Secondary | ICD-10-CM | POA: Diagnosis not present

## 2015-01-24 DIAGNOSIS — M5432 Sciatica, left side: Secondary | ICD-10-CM

## 2015-01-24 DIAGNOSIS — O9089 Other complications of the puerperium, not elsewhere classified: Secondary | ICD-10-CM | POA: Diagnosis present

## 2015-01-24 LAB — URINALYSIS, ROUTINE W REFLEX MICROSCOPIC
Bilirubin Urine: NEGATIVE
Glucose, UA: NEGATIVE mg/dL
Ketones, ur: NEGATIVE mg/dL
NITRITE: NEGATIVE
PH: 7 (ref 5.0–8.0)
Protein, ur: NEGATIVE mg/dL
SPECIFIC GRAVITY, URINE: 1.01 (ref 1.005–1.030)
Urobilinogen, UA: 0.2 mg/dL (ref 0.0–1.0)

## 2015-01-24 LAB — CBC WITH DIFFERENTIAL/PLATELET
BASOS ABS: 0.1 10*3/uL (ref 0.0–0.1)
Basophils Relative: 1 %
Eosinophils Absolute: 0.2 10*3/uL (ref 0.0–0.7)
Eosinophils Relative: 3 %
HEMATOCRIT: 32.6 % — AB (ref 36.0–46.0)
HEMOGLOBIN: 10 g/dL — AB (ref 12.0–15.0)
LYMPHS PCT: 32 %
Lymphs Abs: 1.8 10*3/uL (ref 0.7–4.0)
MCH: 23 pg — ABNORMAL LOW (ref 26.0–34.0)
MCHC: 30.7 g/dL (ref 30.0–36.0)
MCV: 74.9 fL — AB (ref 78.0–100.0)
MONO ABS: 0.5 10*3/uL (ref 0.1–1.0)
Monocytes Relative: 9 %
NEUTROS ABS: 3.1 10*3/uL (ref 1.7–7.7)
NEUTROS PCT: 55 %
Platelets: 476 10*3/uL — ABNORMAL HIGH (ref 150–400)
RBC: 4.35 MIL/uL (ref 3.87–5.11)
RDW: 17.2 % — AB (ref 11.5–15.5)
WBC: 5.6 10*3/uL (ref 4.0–10.5)

## 2015-01-24 LAB — URINE MICROSCOPIC-ADD ON

## 2015-01-24 MED ORDER — CYCLOBENZAPRINE HCL 10 MG PO TABS: 5.0000 mg | ORAL_TABLET | Freq: Three times a day (TID) | ORAL | Status: DC | PRN

## 2015-01-24 MED ORDER — IBUPROFEN 600 MG PO TABS: 600.0000 mg | ORAL_TABLET | Freq: Four times a day (QID) | ORAL | Status: DC | PRN

## 2015-01-24 MED ORDER — DICLOXACILLIN SODIUM 500 MG PO CAPS: 500.0000 mg | ORAL_CAPSULE | Freq: Four times a day (QID) | ORAL | Status: DC

## 2015-01-24 MED ORDER — IBUPROFEN 600 MG PO TABS
600.0000 mg | ORAL_TABLET | Freq: Once | ORAL | Status: AC
  Administered 2015-01-24: 600 mg via ORAL
  Filled 2015-01-24: qty 1

## 2015-01-24 NOTE — MAU Provider Note (Signed)
Chief Complaint: Wound Check and Back Pain  First Provider Initiated Contact with Patient 01/24/15 1506     SUBJECTIVE HPI: [name] Young is a 24 y.o. G2P1102 at 16 days S/P repeat LTCS on 01/08/15 who presents to [facility] reporting incision pain and white, blood-tinged drainage and low abd, low back pain over the past week.   Incision pain Location: Right end of incision Quality: Tender Severity: 5/10 on pain scale now, but worse w/ mvmt Duration: 1 week Context: none Timing: Constant w/ intermittent worsening Modifying factors: worse w/ mvmt, palpation. Hasn't tried anything for pain. Ran out of Percocet and Ibuprofen last week. Associated signs and symptoms: Pos for white, sometimes blood-tinged drainage and swelling above right end of incision over the past week. Swelling has nearly resolved.   Back pain Location: mid (around site of spinal anesthesia) and low left Quality: sore, occasionally sharp Severity: 6/10 on pain scale when it occurs.  Duration: 1 week Context: none Timing: intermittent Modifying factors: worse w/ mvmt, certain positions Associated signs and symptoms: LBP associated w/ numbness radiating down left hip. None now.    Past Medical History  Diagnosis Date  . Medical history non-contributory   . Pregnancy induced hypertension   . Depression    OB History  Gravida Para Term Preterm AB SAB TAB Ectopic Multiple Living  0 0 0 0 0 2    # Outcome Date GA Lbr Len/2nd Weight Sex Delivery Anes PTL Lv  2 Term 01/08/15 [redacted]w[redacted]d  5 lb 3.8 oz (2.375 kg) F CS-LTranv Spinal  Y     Comments: WNL  1 Preterm 02/17/14 [redacted]w[redacted]d   F CS-LTranv Spinal  Y     Past Surgical History  Procedure Laterality Date  . Cesarean section N/A 02/17/2014    Procedure: CESAREAN SECTION;  Surgeon: Tereso Newcomer, MD;  Location: [facility];  Service: Obstetrics;  Laterality: N/A;  . Cesarean section N/A 01/08/2015    Procedure: CESAREAN SECTION;  Surgeon: Tereso Newcomer, MD;   Location: [facility];  Service: Obstetrics;  Laterality: N/A;   Social History   Social History  . Marital Status: Single    Spouse Name: N/A  . Number of Children: N/A  . Years of Education: N/A   Occupational History  . Not on file.   Social History Main Topics  . Smoking status: Never Smoker   . Smokeless tobacco: Never Used  . Alcohol Use: No  . Drug Use: No  . Sexual Activity: Yes    Birth Control/ Protection: None   Other Topics Concern  . Not on file   Social History Narrative   No current facility-administered medications on file prior to encounter.   Current Outpatient Prescriptions on File Prior to Encounter  Medication Sig Dispense Refill  . sertraline (ZOLOFT) 50 MG tablet Take 1 tablet (50 mg total) by mouth daily. 30 tablet 2  . docusate sodium (COLACE) 100 MG capsule Take 1 capsule (100 mg total) by mouth 2 (two) times daily as needed. (Patient not taking: Reported on 01/24/2015) 30 capsule 2  . oxyCODONE-acetaminophen (PERCOCET/ROXICET) 5-325 MG per tablet Take 1 tablet by mouth every 4 (four) hours as needed for severe pain. (Patient not taking: Reported on 01/24/2015) 30 tablet 0  . Prenatal Vit-Fe Fumarate-FA (PRENATAL MULTIVITAMIN) TABS tablet Take 1 tablet by mouth daily at 12 noon. (Patient not taking: Reported on 01/24/2015) 30 tablet 2   No Known Allergies  I have reviewed the past Medical  Hx, Surgical Hx, Social Hx, Allergies and Medications.   Review of Systems  Constitutional: Negative for fever and chills.  Gastrointestinal: Positive for abdominal pain. Negative for nausea, vomiting, diarrhea and constipation.  Genitourinary: Positive for vaginal bleeding (scant lochia). Negative for dysuria, urgency, frequency, hematuria, flank pain and pelvic pain.  Musculoskeletal: Positive for back pain. Negative for myalgias, gait problem, neck pain and neck stiffness.  Skin: Negative for rash.       Incision pian and drainage  Neurological: Positive for  numbness. Negative for weakness.    OBJECTIVE Patient Vitals for the past 24 hrs:  BP Temp Temp src Pulse Resp  01/24/15 1401 118/77 mmHg 98.6 F (37 C) Oral 69 16   Constitutional: Well-developed, well-nourished female in no acute distress.  Cardiovascular: normal rate Respiratory: normal rate and effort.  GI: Abd soft, mod tenderness, slightly increased firmness and faint erythema above right end of incision. Rest of incision mildly tender. Incision healing well. No drainage. Pos BS x 4. Abd otherwise NT.  MS: Extremities nontender, no edema, normal ROM. Neurologic: Alert and oriented x 4. Grossly normal sensation of lower extremities.  GU: Neg CVAT.  LAB RESULTS Results for orders placed or performed during the hospital encounter of 01/24/15 (from the past 24 hour(s))  CBC with Differential/Platelet     Status: Abnormal   Collection Time: 01/24/15  3:16 PM  Result Value Ref Range   WBC 5.6 4.0 - 10.5 K/uL   RBC 4.35 3.87 - 5.11 MIL/uL   Hemoglobin 10.0 (L) 12.0 - 15.0 g/dL   HCT 41.3 (L) 24.4 - 01.0 %   MCV 74.9 (L) 78.0 - 100.0 fL   MCH 23.0 (L) 26.0 - 34.0 pg   MCHC 30.7 30.0 - 36.0 g/dL   RDW 27.2 (H) 53.6 - 64.4 %   Platelets 476 (H) 150 - 400 K/uL   Neutrophils Relative % 55 %   Neutro Abs 3.1 1.7 - 7.7 K/uL   Lymphocytes Relative 32 %   Lymphs Abs 1.8 0.7 - 4.0 K/uL   Monocytes Relative 9 %   Monocytes Absolute 0.5 0.1 - 1.0 K/uL   Eosinophils Relative 3 %   Eosinophils Absolute 0.2 0.0 - 0.7 K/uL   Basophils Relative 1 %   Basophils Absolute 0.1 0.0 - 0.1 K/uL  Urinalysis, Routine w reflex microscopic (not at [facility])     Status: Abnormal   Collection Time: 01/24/15  3:29 PM  Result Value Ref Range   Color, Urine YELLOW YELLOW   APPearance CLEAR CLEAR   Specific Gravity, Urine 1.010 1.005 - 1.030   pH 7.0 5.0 - 8.0   Glucose, UA NEGATIVE NEGATIVE mg/dL   Hgb urine dipstick LARGE (A) NEGATIVE   Bilirubin Urine NEGATIVE NEGATIVE   Ketones, ur NEGATIVE NEGATIVE  mg/dL   Protein, ur NEGATIVE NEGATIVE mg/dL   Urobilinogen, UA 0.2 0.0 - 1.0 mg/dL   Nitrite NEGATIVE NEGATIVE   Leukocytes, UA MODERATE (A) NEGATIVE  Urine microscopic-add on     Status: Abnormal   Collection Time: 01/24/15  3:29 PM  Result Value Ref Range   Squamous Epithelial / LPF FEW (A) RARE   WBC, UA 3-6 <3 WBC/hpf   RBC / HPF 7-10 <3 RBC/hpf   Bacteria, UA FEW (A) RARE   Urine-Other MUCOUS PRESENT     IMAGING NA  MAU COURSE CBC, UA, Ibuprofen, heat to back.   Discussed Sx, exam, labs w/ anesthesiologists and Dr. Erin Fulling.   Pain is the same, but  pt feeling ready for discharge.   MDM -Discussed back pain and numbness radiating down leg w/ Anesthesiologist. W/ onset ~1 week after delivery, intermittent nature, absence of fever, chills, tenderness or mass at site he doubts any relationship to spinal anesthesia and does not recommend further work-up. Likely MS pain R/T pregnancy and postpartum changes, sciatica.  -Incision Sx possibly from mild infection. Per Dr. Erin Fulling will Tx w/ Dicloxacillin.    ASSESSMENT 1. Cellulitis, wound, post-operative, initial encounter   2. Sciatic pain, left    PLAN Discharge home in stable condition. Return to MAU for fever, chills, enlarging mass or purulent drainage from incision or persistent LE numbness or occurrence of LE weakness. Comfort measures.     Follow-up Information    Follow up with [facility] On 02/01/2015.   Specialty:  Obstetrics and Gynecology   Why:  For wound check   Contact information:   [address] [place] [address] [phone]      Follow up with [facility] MATERNITY ADMISSIONS.   Why:  As needed in emergencies   Contact information:   [address] [id number] mc [place] [address] [phone]       Medication List    STOP taking these medications        docusate sodium 100 MG capsule  Commonly known  as:  COLACE     oxyCODONE-acetaminophen 5-325 MG per tablet  Commonly known as:  PERCOCET/ROXICET      TAKE these medications        cyclobenzaprine 10 MG tablet  Commonly known as:  FLEXERIL  Take 0.5-1 tablets (5-10 mg total) by mouth every 8 (eight) hours as needed for muscle spasms.     dicloxacillin 500 MG capsule  Commonly known as:  DYNAPEN  Take 1 capsule (500 mg total) by mouth 4 (four) times daily.     ibuprofen 600 MG tablet  Commonly known as:  ADVIL,MOTRIN  Take 1 tablet (600 mg total) by mouth every 6 (six) hours as needed for moderate pain.     prenatal multivitamin Tabs tablet  Take 1 tablet by mouth daily at 12 noon.     sertraline 50 MG tablet  Commonly known as:  ZOLOFT  Take 1 tablet (50 mg total) by mouth daily.        [place], [place] 01/24/2015  4:29 PM

## 2015-01-24 NOTE — Discharge Instructions (Signed)
Cellulitis Cellulitis is an infection of the skin and the tissue beneath it. The infected area is usually red and tender. Cellulitis occurs most often in the arms and lower legs.  CAUSES  Cellulitis is caused by bacteria that enter the skin through cracks or cuts in the skin. The most common types of bacteria that cause cellulitis are staphylococci and streptococci. SIGNS AND SYMPTOMS   Redness and warmth.  Swelling.  Tenderness or pain.  Fever. DIAGNOSIS  Your health care provider can usually determine what is wrong based on a physical exam. Blood tests may also be done. TREATMENT  Treatment usually involves taking an antibiotic medicine. HOME CARE INSTRUCTIONS   Take your antibiotic medicine as directed by your health care provider. Finish the antibiotic even if you start to feel better.  Keep the infected arm or leg elevated to reduce swelling.  Apply a warm cloth to the affected area up to 4 times per day to relieve pain.  Take medicines only as directed by your health care provider.  Keep all follow-up visits as directed by your health care provider. SEEK MEDICAL CARE IF:   You notice red streaks coming from the infected area.  Your red area gets larger or turns dark in color.  Your bone or joint underneath the infected area becomes painful after the skin has healed.  Your infection returns in the same area or another area.  You notice a swollen bump in the infected area.  You develop new symptoms.  You have a fever. SEEK IMMEDIATE MEDICAL CARE IF:   You feel very sleepy.  You develop vomiting or diarrhea.  You have a general ill feeling (malaise) with muscle aches and pains. MAKE SURE YOU:   Understand these instructions.  Will watch your condition.  Will get help right away if you are not doing well or get worse. Document Released: 01/25/2005 Document Revised: 09/01/2013 Document Reviewed: 07/03/2011 [facility] Patient Information 2015 [place], [place].  This information is not intended to replace advice given to you by your health care provider. Make sure you discuss any questions you have with your health care provider.   Sciatica Sciatica is pain, weakness, numbness, or tingling along the path of the sciatic nerve. The nerve starts in the lower back and runs down the back of each leg. The nerve controls the muscles in the lower leg and in the back of the knee, while also providing sensation to the back of the thigh, lower leg, and the sole of your foot. Sciatica is a symptom of another medical condition. For instance, nerve damage or certain conditions, such as a herniated disk or bone spur on the spine, pinch or put pressure on the sciatic nerve. This causes the pain, weakness, or other sensations normally associated with sciatica. Generally, sciatica only affects one side of the body. CAUSES   Herniated or slipped disc.  Degenerative disk disease.  A pain disorder involving the narrow muscle in the buttocks (piriformis syndrome).  Pelvic injury or fracture.  Pregnancy.  Tumor (rare). SYMPTOMS  Symptoms can vary from mild to very severe. The symptoms usually travel from the low back to the buttocks and down the back of the leg. Symptoms can include:  Mild tingling or dull aches in the lower back, leg, or hip.  Numbness in the back of the calf or sole of the foot.  Burning sensations in the lower back, leg, or hip.  Sharp pains in the lower back, leg, or hip.  Leg weakness.  Severe back pain inhibiting movement. These symptoms may get worse with coughing, sneezing, laughing, or prolonged sitting or standing. Also, being overweight may worsen symptoms. DIAGNOSIS  Your caregiver will perform a physical exam to look for common symptoms of sciatica. He or she may ask you to do certain movements or activities that would trigger sciatic nerve pain. Other tests may be performed to find the cause of the sciatica. These may  include:  Blood tests.  X-rays.  Imaging tests, such as an MRI or CT scan. TREATMENT  Treatment is directed at the cause of the sciatic pain. Sometimes, treatment is not necessary and the pain and discomfort goes away on its own. If treatment is needed, your caregiver may suggest:  Over-the-counter medicines to relieve pain.  Prescription medicines, such as anti-inflammatory medicine, muscle relaxants, or narcotics.  Applying heat or ice to the painful area.  Steroid injections to lessen pain, irritation, and inflammation around the nerve.  Reducing activity during periods of pain.  Exercising and stretching to strengthen your abdomen and improve flexibility of your spine. Your caregiver may suggest losing weight if the extra weight makes the back pain worse.  Physical therapy.  Surgery to eliminate what is pressing or pinching the nerve, such as a bone spur or part of a herniated disk. HOME CARE INSTRUCTIONS   Only take over-the-counter or prescription medicines for pain or discomfort as directed by your caregiver.  Apply ice to the affected area for 20 minutes, 3-4 times a day for the first 48-72 hours. Then try heat in the same way.  Exercise, stretch, or perform your usual activities if these do not aggravate your pain.  Attend physical therapy sessions as directed by your caregiver.  Keep all follow-up appointments as directed by your caregiver.  Do not wear high heels or shoes that do not provide proper support.  Check your mattress to see if it is too soft. A firm mattress may lessen your pain and discomfort. SEEK IMMEDIATE MEDICAL CARE IF:   You lose control of your bowel or bladder (incontinence).  You have increasing weakness in the lower back, pelvis, buttocks, or legs.  You have redness or swelling of your back.  You have a burning sensation when you urinate.  You have pain that gets worse when you lie down or awakens you at night.  Your pain is worse  than you have experienced in the past.  Your pain is lasting longer than 4 weeks.  You are suddenly losing weight without reason. MAKE SURE YOU:  Understand these instructions.  Will watch your condition.  Will get help right away if you are not doing well or get worse. Document Released: 04/11/2001 Document Revised: 10/17/2011 Document Reviewed: 08/27/2011 [facility] Patient Information 2015 [place], [place]. This information is not intended to replace advice given to you by your health care provider. Make sure you discuss any questions you have with your health care provider.

## 2015-01-24 NOTE — MAU Note (Signed)
Abdominal incision well approximated, no redness or drainage noted @ this time.

## 2015-01-24 NOTE — MAU Note (Signed)
C/S on Sept 9, pt C/O increased pain on R side of incision, having pain @ spinal site in lower back, L hip was numb yesterday - not today.  Having white bloody drainage from incision only when it is touched.  Denies fever, had vomiting & diarrhea yesterday - none today.

## 2015-02-01 ENCOUNTER — Encounter: Payer: Self-pay | Admitting: General Practice

## 2015-02-01 ENCOUNTER — Ambulatory Visit: Payer: Self-pay | Admitting: Obstetrics & Gynecology

## 2015-02-01 ENCOUNTER — Telehealth: Payer: Self-pay | Admitting: General Practice

## 2015-02-01 NOTE — Telephone Encounter (Signed)
Patient no showed for MAU follow up appt. Called patient, no answer- left message stating we are trying to reach you as it appears you missed your appt today, please call our office to reschedule this appt. Will send letter

## 2015-02-10 ENCOUNTER — Encounter (HOSPITAL_COMMUNITY)
Admission: RE | Admit: 2015-02-10 | Discharge: 2015-02-10 | Disposition: A | Discharge status: Home / Self Care | Payer: Medicaid Other | Source: Ambulatory Visit | Attending: Obstetrics & Gynecology | Admitting: Obstetrics & Gynecology

## 2015-02-10 DIAGNOSIS — O923 Agalactia: Secondary | ICD-10-CM | POA: Insufficient documentation

## 2015-02-17 ENCOUNTER — Ambulatory Visit: Payer: Self-pay | Admitting: Obstetrics & Gynecology

## 2015-03-14 ENCOUNTER — Encounter (HOSPITAL_COMMUNITY)
Admission: RE | Admit: 2015-03-14 | Discharge: 2015-03-14 | Disposition: A | Discharge status: Home / Self Care | Payer: Medicaid Other | Source: Ambulatory Visit | Attending: Obstetrics & Gynecology | Admitting: Obstetrics & Gynecology

## 2015-03-14 DIAGNOSIS — O923 Agalactia: Secondary | ICD-10-CM | POA: Insufficient documentation

## 2015-03-31 ENCOUNTER — Encounter: Payer: Self-pay | Admitting: *Deleted

## 2015-03-31 ENCOUNTER — Ambulatory Visit (INDEPENDENT_AMBULATORY_CARE_PROVIDER_SITE_OTHER): Payer: Medicaid Other | Admitting: *Deleted

## 2015-03-31 VITALS — BP 130/80 | HR 82 | Temp 98.5°F | Wt 138.1 lb

## 2015-03-31 DIAGNOSIS — Z3042 Encounter for surveillance of injectable contraceptive: Secondary | ICD-10-CM | POA: Diagnosis present

## 2015-03-31 MED ORDER — MEDROXYPROGESTERONE ACETATE 150 MG/ML IM SUSP
150.0000 mg | Freq: Once | INTRAMUSCULAR | Status: AC
  Administered 2015-03-31: 150 mg via INTRAMUSCULAR

## 2015-04-13 ENCOUNTER — Encounter (HOSPITAL_COMMUNITY)
Admission: RE | Admit: 2015-04-13 | Discharge: 2015-04-13 | Disposition: A | Discharge status: Home / Self Care | Payer: Medicaid Other | Source: Ambulatory Visit | Attending: Obstetrics & Gynecology | Admitting: Obstetrics & Gynecology

## 2015-04-13 DIAGNOSIS — O923 Agalactia: Secondary | ICD-10-CM | POA: Insufficient documentation

## 2015-05-14 ENCOUNTER — Encounter (HOSPITAL_COMMUNITY)
Admission: RE | Admit: 2015-05-14 | Discharge: 2015-05-14 | Disposition: A | Discharge status: Home / Self Care | Payer: Medicaid Other | Source: Ambulatory Visit | Attending: Obstetrics & Gynecology | Admitting: Obstetrics & Gynecology

## 2015-05-14 DIAGNOSIS — O923 Agalactia: Secondary | ICD-10-CM | POA: Insufficient documentation

## 2015-06-22 ENCOUNTER — Encounter: Payer: Self-pay | Admitting: *Deleted

## 2015-07-16 ENCOUNTER — Encounter (HOSPITAL_COMMUNITY)
Admission: RE | Admit: 2015-07-16 | Discharge: 2015-07-16 | Disposition: A | Discharge status: Home / Self Care | Payer: Medicaid Other | Source: Ambulatory Visit | Attending: Obstetrics & Gynecology | Admitting: Obstetrics & Gynecology

## 2015-08-16 ENCOUNTER — Encounter (HOSPITAL_COMMUNITY)
Admission: RE | Admit: 2015-08-16 | Discharge: 2015-08-16 | Disposition: A | Discharge status: Home / Self Care | Payer: Self-pay | Source: Ambulatory Visit | Attending: Obstetrics & Gynecology | Admitting: Obstetrics & Gynecology

## 2015-09-15 ENCOUNTER — Encounter (HOSPITAL_COMMUNITY)
Admission: RE | Admit: 2015-09-15 | Discharge: 2015-09-15 | Disposition: A | Discharge status: Home / Self Care | Payer: Self-pay | Source: Ambulatory Visit | Attending: Obstetrics & Gynecology | Admitting: Obstetrics & Gynecology

## 2015-10-17 ENCOUNTER — Encounter (HOSPITAL_COMMUNITY)
Admission: RE | Admit: 2015-10-17 | Discharge: 2015-10-17 | Disposition: A | Discharge status: Home / Self Care | Payer: Self-pay | Source: Ambulatory Visit | Attending: Obstetrics & Gynecology | Admitting: Obstetrics & Gynecology

## 2015-11-16 ENCOUNTER — Encounter (HOSPITAL_COMMUNITY)
Admission: RE | Admit: 2015-11-16 | Discharge: 2015-11-16 | Disposition: A | Discharge status: Home / Self Care | Payer: Medicaid Other | Source: Ambulatory Visit | Attending: Obstetrics & Gynecology | Admitting: Obstetrics & Gynecology

## 2015-12-16 ENCOUNTER — Encounter (HOSPITAL_COMMUNITY)
Admission: RE | Admit: 2015-12-16 | Discharge: 2015-12-16 | Disposition: A | Discharge status: Home / Self Care | Payer: Medicaid Other | Source: Ambulatory Visit | Attending: Obstetrics & Gynecology | Admitting: Obstetrics & Gynecology

## 2016-01-17 ENCOUNTER — Encounter (HOSPITAL_COMMUNITY)
Admission: RE | Admit: 2016-01-17 | Discharge: 2016-01-17 | Disposition: A | Discharge status: Home / Self Care | Payer: Medicaid Other | Source: Ambulatory Visit | Attending: Obstetrics & Gynecology | Admitting: Obstetrics & Gynecology

## 2016-02-16 ENCOUNTER — Encounter (HOSPITAL_COMMUNITY)
Admission: RE | Admit: 2016-02-16 | Discharge: 2016-02-16 | Disposition: A | Discharge status: Home / Self Care | Payer: Medicaid Other | Source: Ambulatory Visit | Attending: Obstetrics & Gynecology | Admitting: Obstetrics & Gynecology

## 2016-03-19 ENCOUNTER — Encounter (HOSPITAL_COMMUNITY): Payer: Self-pay | Attending: Obstetrics & Gynecology

## 2016-04-27 ENCOUNTER — Inpatient Hospital Stay (HOSPITAL_COMMUNITY)
Admission: AD | Admit: 2016-04-27 | Discharge: 2016-04-27 | Disposition: A | Discharge status: Home / Self Care | Payer: Medicaid Other | Source: Ambulatory Visit | Attending: Obstetrics & Gynecology | Admitting: Obstetrics & Gynecology

## 2016-04-27 ENCOUNTER — Inpatient Hospital Stay (HOSPITAL_COMMUNITY): Payer: Medicaid Other

## 2016-04-27 ENCOUNTER — Encounter (HOSPITAL_COMMUNITY): Payer: Self-pay | Admitting: *Deleted

## 2016-04-27 DIAGNOSIS — O209 Hemorrhage in early pregnancy, unspecified: Secondary | ICD-10-CM | POA: Diagnosis not present

## 2016-04-27 DIAGNOSIS — Z3A11 11 weeks gestation of pregnancy: Secondary | ICD-10-CM | POA: Insufficient documentation

## 2016-04-27 DIAGNOSIS — R109 Unspecified abdominal pain: Secondary | ICD-10-CM | POA: Diagnosis present

## 2016-04-27 DIAGNOSIS — Z3491 Encounter for supervision of normal pregnancy, unspecified, first trimester: Secondary | ICD-10-CM

## 2016-04-27 LAB — URINALYSIS, ROUTINE W REFLEX MICROSCOPIC
BILIRUBIN URINE: NEGATIVE
Glucose, UA: NEGATIVE mg/dL
Ketones, ur: 20 mg/dL — AB
Leukocytes, UA: NEGATIVE
Nitrite: NEGATIVE
PH: 5 (ref 5.0–8.0)
Protein, ur: 30 mg/dL — AB
Specific Gravity, Urine: 1.031 — ABNORMAL HIGH (ref 1.005–1.030)

## 2016-04-27 LAB — CBC
HCT: 34.3 % — ABNORMAL LOW (ref 36.0–46.0)
HEMOGLOBIN: 11.4 g/dL — AB (ref 12.0–15.0)
MCH: 25.7 pg — AB (ref 26.0–34.0)
MCHC: 33.2 g/dL (ref 30.0–36.0)
MCV: 77.3 fL — ABNORMAL LOW (ref 78.0–100.0)
PLATELETS: 379 10*3/uL (ref 150–400)
RBC: 4.44 MIL/uL (ref 3.87–5.11)
RDW: 15.5 % (ref 11.5–15.5)
WBC: 6.6 10*3/uL (ref 4.0–10.5)

## 2016-04-27 LAB — WET PREP, GENITAL
Clue Cells Wet Prep HPF POC: NONE SEEN
Sperm: NONE SEEN
Trich, Wet Prep: NONE SEEN
YEAST WET PREP: NONE SEEN

## 2016-04-27 LAB — POCT PREGNANCY, URINE: Preg Test, Ur: POSITIVE — AB

## 2016-04-27 LAB — HCG, QUANTITATIVE, PREGNANCY: hCG, Beta Chain, Quant, S: 46178 m[IU]/mL — ABNORMAL HIGH (ref <5)

## 2016-04-27 MED ORDER — RHO D IMMUNE GLOBULIN 1500 UNIT/2ML IJ SOSY
300.0000 ug | PREFILLED_SYRINGE | Freq: Once | INTRAMUSCULAR | Status: AC
  Administered 2016-04-27: 300 ug via INTRAMUSCULAR
  Filled 2016-04-27: qty 2

## 2016-04-27 MED ORDER — CONCEPT OB 130-92.4-1 MG PO CAPS: 1.0000 | ORAL_CAPSULE | Freq: Every day | ORAL | 11 refills | Status: DC

## 2016-04-27 NOTE — MAU Note (Signed)
Pt had pos HPT on 12/21, started having abdominal pain a week ago, has had intermittent spotting - last time was on 12/25.  Also having nausea - no pain or spotting today.

## 2016-04-27 NOTE — Discharge Instructions (Signed)
[facility]  ° ° °[facility]       Phone: [phone] ° °[facility] Phone: [phone] ° °[facility] [place]  Phone: [phone] ° °[facility] [place]  Phone: [phone] ° °[facility] [place] [facility]  Phone: [phone] ° °[facility]       Phone: [phone] ° °[facility]    Phone: [phone]  ° °[facility]    Phone: [phone] ° °[facility]    Phone: [phone] ° °[facility]    Phone: [phone] ° °[facility]    Phone: [phone] ° °[facility]       Phone: [phone]  ° °[facility]  Phone: [phone] ° °[facility]    Phone: [phone] ° °[facility]   Phone: [phone] ° °[facility]      Phone: [phone] ° °[facility]    Phone: [phone] ° °

## 2016-04-27 NOTE — MAU Provider Note (Signed)
Chief Complaint: Nausea   First Provider Initiated Contact with Patient 04/27/16 1539      SUBJECTIVE HPI: [name] Young is a 25 y.o. [id number] at 11w by LMP who presents to maternity admissions reporting abdominal cramping and spotting x 2 days. She has not tried any treatments. Her spotting is intermittent and unchanged since onset.  Her pain is intermittent, lower abdominal pain that is unchanged with movement, rest, Tylenol. She has not yet started prenatal care. She denies vaginal itching/burning, urinary symptoms, h/a, dizziness, n/v, or fever/chills.     HPI  Past Medical History:  Diagnosis Date  . Depression   . Medical history non-contributory   . Pregnancy induced hypertension    Past Surgical History:  Procedure Laterality Date  . CESAREAN SECTION N/A 02/17/2014   Procedure: CESAREAN SECTION;  Surgeon: Tereso NewcomerUgonna A Anyanwu, MD;  Location: [facility];  Service: Obstetrics;  Laterality: N/A;  . CESAREAN SECTION N/A 01/08/2015   Procedure: CESAREAN SECTION;  Surgeon: Tereso NewcomerUgonna A Anyanwu, MD;  Location: [facility];  Service: Obstetrics;  Laterality: N/A;   Social History   Social History  . Marital status: Single    Spouse name: N/A  . Number of children: N/A  . Years of education: N/A   Occupational History  . Not on file.   Social History Main Topics  . Smoking status: Never Smoker  . Smokeless tobacco: Never Used  . Alcohol use No  . Drug use: No  . Sexual activity: Yes    Birth control/ protection: None   Other Topics Concern  . Not on file   Social History Narrative  . No narrative on file   No current facility-administered medications on file prior to encounter.    No current outpatient prescriptions on file prior to encounter.   No Known Allergies  ROS:  Review of Systems  Constitutional: Negative for chills, fatigue and fever.  Respiratory: Negative for shortness of breath.   Cardiovascular: Negative for chest pain.  Genitourinary: Positive for pelvic pain and  vaginal bleeding. Negative for difficulty urinating, dysuria, flank pain, vaginal discharge and vaginal pain.  Neurological: Negative for dizziness and headaches.  Psychiatric/Behavioral: Negative.      I have reviewed patient's Past Medical Hx, Surgical Hx, Family Hx, Social Hx, medications and allergies.   Physical Exam   Patient Vitals for the past 24 hrs:  BP Temp Temp src Pulse Resp Height Weight  04/27/16 1420 121/84 98.5 F (36.9 C) Oral 89 18 5\' 3"  (1.6 m) 139 lb (63 kg)   Constitutional: Well-developed, well-nourished female in no acute distress.  Cardiovascular: normal rate Respiratory: normal effort GI: Abd soft, non-tender. Pos BS x 4 MS: Extremities nontender, no edema, normal ROM Neurologic: Alert and oriented x 4.  GU: Neg CVAT.  PELVIC EXAM: Cervix pink, visually closed, slightly friable, moderate amount thin white discharge, vaginal walls and external genitalia normal Bimanual exam: Cervix 0/long/high, firm, anterior, neg CMT, uterus nontender, slightly enlarged, adnexa without tenderness, enlargement, or mass  LAB RESULTS Results for orders placed or performed during the hospital encounter of 04/27/16 (from the past 24 hour(s))  Urinalysis, Routine w reflex microscopic     Status: Abnormal   Collection Time: 04/27/16  2:23 PM  Result Value Ref Range   Color, Urine YELLOW YELLOW   APPearance CLOUDY (A) CLEAR   Specific Gravity, Urine 1.031 (H) 1.005 - 1.030   pH 5.0 5.0 - 8.0   Glucose, UA NEGATIVE NEGATIVE mg/dL   Hgb urine dipstick SMALL (  A) NEGATIVE   Bilirubin Urine NEGATIVE NEGATIVE   Ketones, ur 20 (A) NEGATIVE mg/dL   Protein, ur 30 (A) NEGATIVE mg/dL   Nitrite NEGATIVE NEGATIVE   Leukocytes, UA NEGATIVE NEGATIVE   RBC / HPF 0-5 0 - 5 RBC/hpf   WBC, UA 0-5 0 - 5 WBC/hpf   Bacteria, UA RARE (A) NONE SEEN   Squamous Epithelial / LPF 6-30 (A) NONE SEEN   Mucous PRESENT   Pregnancy, urine POC     Status: Abnormal   Collection Time: 04/27/16  2:31 PM   Result Value Ref Range   Preg Test, Ur POSITIVE (A) NEGATIVE  CBC     Status: Abnormal   Collection Time: 04/27/16  2:32 PM  Result Value Ref Range   WBC 6.6 4.0 - 10.5 K/uL   RBC 4.44 3.87 - 5.11 MIL/uL   Hemoglobin 11.4 (L) 12.0 - 15.0 g/dL   HCT 16.134.3 (L) 09.636.0 - 04.546.0 %   MCV 77.3 (L) 78.0 - 100.0 fL   MCH 25.7 (L) 26.0 - 34.0 pg   MCHC 33.2 30.0 - 36.0 g/dL   RDW 40.915.5 81.111.5 - 91.415.5 %   Platelets 379 150 - 400 K/uL  hCG, quantitative, pregnancy     Status: Abnormal   Collection Time: 04/27/16  2:32 PM  Result Value Ref Range   hCG, Beta Chain, Quant, S 46,178 (H) <5 mIU/mL  Rh IG workup (includes ABO/Rh)     Status: None (Preliminary result)   Collection Time: 04/27/16  2:32 PM  Result Value Ref Range   Gestational Age(Wks) 8    ABO/RH(D) B NEG    Antibody Screen NEG    Unit Number [id number]    Blood Component Type RHIG    Unit division 00    Status of Unit ISSUED    Transfusion Status OK TO TRANSFUSE   Wet prep, genital     Status: Abnormal   Collection Time: 04/27/16  4:05 PM  Result Value Ref Range   Yeast Wet Prep HPF POC NONE SEEN NONE SEEN   Trich, Wet Prep NONE SEEN NONE SEEN   Clue Cells Wet Prep HPF POC NONE SEEN NONE SEEN   WBC, Wet Prep HPF POC MANY (A) NONE SEEN   Sperm NONE SEEN     --/--/B NEG (12/28 1432)  IMAGING [place] Ob Comp Less 14 Wks  Result Date: 04/27/2016 CLINICAL DATA:  Abdominal pain with cramping.  Vaginal spotting. EXAM: OBSTETRIC <14 WK US AND TRANSVAGINAL OB US TECHNIQUE: Both transabdominal and transvaginal ultrasound examinations were performed for complete evaluation of the gestation as well as the maternal uterus, adnexal regions, and pelvic cul-de-sac. Transvaginal technique was performed to assess early pregnancy. COMPARISON:  None. FINDINGS: Intrauterine gestational sac: Single. Yolk sac:  Visualized. Embryo:  Visualized. Cardiac Activity: Visualized. Heart Rate: 137  bpm CRL:  13  mm   7 w   3 d                  US EDC: 12/11/2016  Subchorionic hemorrhage:  None. Maternal uterus/adnexae: Maternal ovaries are unremarkable. Small volume intraperitoneal free fluid identified in the cul-de-sac and adjacent to the right ovary which contains an apparent corpus luteum cyst. IMPRESSION: Single living intrauterine gestation at estimated 7 week 3 day gestational age by crown-rump length. Electronically Signed   By: Kennith CenterEric  Mansell M.D.   On: 04/27/2016 16:53   [place] Ob Transvaginal  Result Date: 04/27/2016 CLINICAL DATA:  Abdominal pain with cramping.  Vaginal  spotting. EXAM: OBSTETRIC <14 WK [place] AND TRANSVAGINAL OB US TECHNIQUE: Both transabdominal and transvaginal ultrasound examinations were performed for complete evaluation of the gestation as well as the maternal uterus, adnexal regions, and pelvic cul-de-sac. Transvaginal technique was performed to assess early pregnancy. COMPARISON:  None. FINDINGS: Intrauterine gestational sac: Single. Yolk sac:  Visualized. Embryo:  Visualized. Cardiac Activity: Visualized. Heart Rate: 137  bpm CRL:  13  mm   7 w   3 d                  [place] EDC: 12/11/2016 Subchorionic hemorrhage:  None. Maternal uterus/adnexae: Maternal ovaries are unremarkable. Small volume intraperitoneal free fluid identified in the cul-de-sac and adjacent to the right ovary which contains an apparent corpus luteum cyst. IMPRESSION: Single living intrauterine gestation at estimated 7 week 3 day gestational age by crown-rump length. Electronically Signed   By: Kennith Center M.D.   On: 04/27/2016 16:53    MAU Management/MDM: Ordered labs and [place] and reviewed results. Today's evaluation was done to determine if the pt bleeding and pain are caused by ectopic pregnancy.  [place] today indicates IUP.  Rhpphylac given today in MAU for early pregnancy bleeding.  Pt to follow up with early prenatal care.  Pt stable at time of discharge.  ASSESSMENT 1. Normal IUP (intrauterine pregnancy) on prenatal ultrasound, first trimester   2. Vaginal bleeding in  pregnancy, first trimester     PLAN Discharge home with bleeding precautions F/U with prenatal provider of your choice  Allergies as of 04/27/2016   No Known Allergies     Medication List    TAKE these medications   CONCEPT OB 130-92.4-1 MG Caps Take 1 capsule by mouth daily.      Follow-up Information    Prenatal provider of your choice,see list provided Follow up.   Why:  Return to MAU as needed for emergencies          Sharen Counter Certified Nurse-Midwife 04/27/2016  5:39 PM

## 2016-04-28 LAB — RH IG WORKUP (INCLUDES ABO/RH)
ABO/RH(D): B NEG
ANTIBODY SCREEN: NEGATIVE
GESTATIONAL AGE(WKS): 8
Unit division: 0

## 2016-04-28 LAB — HIV ANTIBODY (ROUTINE TESTING W REFLEX): HIV SCREEN 4TH GENERATION: NONREACTIVE

## 2016-04-28 LAB — GC/CHLAMYDIA PROBE AMP (~~LOC~~) NOT AT ARMC
CHLAMYDIA, DNA PROBE: NEGATIVE
NEISSERIA GONORRHEA: NEGATIVE

## 2016-07-27 ENCOUNTER — Ambulatory Visit (INDEPENDENT_AMBULATORY_CARE_PROVIDER_SITE_OTHER): Payer: Medicaid Other | Admitting: Obstetrics & Gynecology

## 2016-07-27 ENCOUNTER — Other Ambulatory Visit (HOSPITAL_COMMUNITY)
Admission: RE | Admit: 2016-07-27 | Discharge: 2016-07-27 | Disposition: A | Discharge status: Home / Self Care | Payer: Medicaid Other | Source: Ambulatory Visit | Attending: Obstetrics & Gynecology | Admitting: Obstetrics & Gynecology

## 2016-07-27 VITALS — BP 109/70 | HR 52 | Temp 97.7°F | Wt 151.0 lb

## 2016-07-27 DIAGNOSIS — O09299 Supervision of pregnancy with other poor reproductive or obstetric history, unspecified trimester: Secondary | ICD-10-CM

## 2016-07-27 DIAGNOSIS — Z3481 Encounter for supervision of other normal pregnancy, first trimester: Secondary | ICD-10-CM

## 2016-07-27 DIAGNOSIS — O34219 Maternal care for unspecified type scar from previous cesarean delivery: Secondary | ICD-10-CM

## 2016-07-27 DIAGNOSIS — O09292 Supervision of pregnancy with other poor reproductive or obstetric history, second trimester: Secondary | ICD-10-CM | POA: Diagnosis not present

## 2016-07-27 DIAGNOSIS — Z3492 Encounter for supervision of normal pregnancy, unspecified, second trimester: Secondary | ICD-10-CM | POA: Insufficient documentation

## 2016-07-27 DIAGNOSIS — Z3A20 20 weeks gestation of pregnancy: Secondary | ICD-10-CM | POA: Insufficient documentation

## 2016-07-27 DIAGNOSIS — Z3490 Encounter for supervision of normal pregnancy, unspecified, unspecified trimester: Secondary | ICD-10-CM

## 2016-07-27 MED ORDER — ASPIRIN EC 81 MG PO TBEC: 81.0000 mg | DELAYED_RELEASE_TABLET | Freq: Every day | ORAL | 6 refills | Status: DC

## 2016-07-27 NOTE — Addendum Note (Signed)
Addended by: Arne ClevelandHUTCHINSON, MANDY J on: 07/27/2016 02:32 PM   Modules accepted: Orders

## 2016-07-27 NOTE — Patient Instructions (Signed)

## 2016-07-27 NOTE — Progress Notes (Signed)
  Subjective:    [name] Young is a [id number] 329w3d being seen today for her first obstetrical visit.  Her obstetrical history is significant for late prenatal care and previous cesarean section. Patient does intend to breast feed. Pregnancy history fully reviewed.  Patient reports no complaints.  Vitals:   07/27/16 1007  BP: 109/70  Pulse: (!) 52  Temp: 97.7 F (36.5 C)  Weight: 151 lb (68.5 kg)    HISTORY: OB History  Gravida Para Term Preterm AB Living  3 2 1 1  0 2  SAB TAB Ectopic Multiple Live Births  0 0 0 0 2    # Outcome Date GA Lbr Len/2nd Weight Sex Delivery Anes PTL Lv  3 Current           2 Term 01/08/15 4916w5d  5 lb 3.8 oz (2.375 kg) F CS-LTranv Spinal  LIV     Birth Comments: WNL  1 Preterm 02/17/14 4669w2d   F CS-LTranv Spinal  LIV     Past Medical History:  Diagnosis Date  . Depression   . Medical history non-contributory   . Pregnancy induced hypertension    Past Surgical History:  Procedure Laterality Date  . CESAREAN SECTION N/A 02/17/2014   Procedure: CESAREAN SECTION;  Surgeon: Tereso NewcomerUgonna A Anyanwu, MD;  Location: [facility];  Service: Obstetrics;  Laterality: N/A;  . CESAREAN SECTION N/A 01/08/2015   Procedure: CESAREAN SECTION;  Surgeon: Tereso NewcomerUgonna A Anyanwu, MD;  Location: [facility];  Service: Obstetrics;  Laterality: N/A;   Family History  Problem Relation Age of Onset  . Hypertension Mother   . Hypertension Father      Exam    Uterus:     Pelvic Exam:    Perineum: No Hemorrhoids   Vulva: normal   Vagina:  normal mucosa   pH:     Cervix: no lesions   Adnexa: not evaluated   Bony Pelvis: average  System: Breast:  normal appearance, no masses or tenderness   Skin: normal coloration and turgor, no rashes    Neurologic: oriented, normal mood   Extremities: normal strength, tone, and muscle mass   HEENT extra ocular movement intact and oropharynx clear, no lesions   Mouth/Teeth mucous membranes moist, pharynx normal without lesions and dental hygiene  good   Neck supple and no masses   Cardiovascular: regular rate and rhythm, no murmurs or gallops   Respiratory:  appears well, vitals normal, no respiratory distress, acyanotic, normal RR, neck free of mass or lymphadenopathy, chest clear, no wheezing, crepitations, rhonchi, normal symmetric air entry   Abdomen: soft, non-tender; bowel sounds normal; no masses,  no organomegaly   Urinary: urethral meatus normal      Assessment:    Pregnancy: [id number] Patient Active Problem List   Diagnosis Date Noted  . Supervision of normal pregnancy, antepartum 07/27/2016  . Rubella non-immune status, antepartum 12/24/2014  . Previous cesarean delivery affecting pregnancy, antepartum 12/23/2014  . History of preterm delivery, currently pregnant 12/23/2014  . Hx of preeclampsia, prior pregnancy, currently pregnant 02/15/2014  . Rh negative status during pregnancy 08/24/2013  . Vitamin D deficiency 08/24/2013        Plan:     Initial labs drawn. Prenatal vitamins. Problem list reviewed and updated. Genetic Screening discussed Quad Screen: ordered.  Ultrasound discussed; fetal survey: ordered.  Follow up in 4 weeks. 50% of 30 min visit spent on counseling and coordination of care.  Repeat cesarean at 39 weeks   Scheryl DarterJames Terressa Evola 07/27/2016

## 2016-07-27 NOTE — Progress Notes (Signed)
Previous C/S Previous Preterm delivery - 17P?

## 2016-07-28 LAB — CERVICOVAGINAL ANCILLARY ONLY
Bacterial vaginitis: NEGATIVE
CHLAMYDIA, DNA PROBE: NEGATIVE
Candida vaginitis: NEGATIVE
Neisseria Gonorrhea: NEGATIVE
Trichomonas: NEGATIVE

## 2016-07-31 LAB — OBSTETRIC PANEL, INCLUDING HIV
ANTIBODY SCREEN: NEGATIVE
BASOS: 1 %
Basophils Absolute: 0 10*3/uL (ref 0.0–0.2)
EOS (ABSOLUTE): 0.1 10*3/uL (ref 0.0–0.4)
EOS: 2 %
HEMATOCRIT: 34.9 % (ref 34.0–46.6)
HIV SCREEN 4TH GENERATION: NONREACTIVE
Hemoglobin: 10.8 g/dL — ABNORMAL LOW (ref 11.1–15.9)
Hepatitis B Surface Ag: NEGATIVE
IMMATURE GRANS (ABS): 0 10*3/uL (ref 0.0–0.1)
Immature Granulocytes: 0 %
LYMPHS ABS: 1.7 10*3/uL (ref 0.7–3.1)
LYMPHS: 25 %
MCH: 26.4 pg — AB (ref 26.6–33.0)
MCHC: 30.9 g/dL — AB (ref 31.5–35.7)
MCV: 85 fL (ref 79–97)
MONOS ABS: 0.4 10*3/uL (ref 0.1–0.9)
Monocytes: 6 %
NEUTROS ABS: 4.6 10*3/uL (ref 1.4–7.0)
Neutrophils: 66 %
Platelets: 270 10*3/uL (ref 150–379)
RBC: 4.09 x10E6/uL (ref 3.77–5.28)
RDW: 15.4 % (ref 12.3–15.4)
RH TYPE: POSITIVE
RPR Ser Ql: NONREACTIVE
Rubella Antibodies, IGG: 1.08 index (ref >0.99)
WBC: 6.8 10*3/uL (ref 3.4–10.8)

## 2016-07-31 LAB — HEMOGLOBINOPATHY EVALUATION
HEMOGLOBIN F QUANTITATION: 0 % (ref 0.0–2.0)
HGB C: 0 %
HGB S: 0 %
HGB VARIANT: 0 %
Hemoglobin A2 Quantitation: 2.2 % (ref 1.8–3.2)
Hgb A: 97.8 % (ref 96.4–98.8)

## 2016-07-31 LAB — URINE CULTURE, OB REFLEX

## 2016-07-31 LAB — VARICELLA ZOSTER ANTIBODY, IGG

## 2016-07-31 LAB — CULTURE, OB URINE

## 2016-08-01 ENCOUNTER — Encounter: Payer: Self-pay | Admitting: Obstetrics & Gynecology

## 2016-08-04 ENCOUNTER — Ambulatory Visit (HOSPITAL_COMMUNITY): Payer: Medicaid Other | Attending: Obstetrics & Gynecology

## 2016-08-04 LAB — AFP, QUAD SCREEN
DIA Mom Value: 0.71
DIA Value (EIA): 145.53 pg/mL
DSR (BY AGE) 1 IN: 1052
DSR (SECOND TRIMESTER) 1 IN: 10000
Gestational Age: 20.4 WEEKS
MSAFP Mom: 1.24
MSAFP: 80.4 ng/mL
MSHCG Mom: 0.51
MSHCG: 11764 m[IU]/mL
Maternal Age At EDD: 24.5 YEARS
Osb Risk: 10000
TEST RESULTS AFP: NEGATIVE
UE3 MOM: 1.49
WEIGHT: 151 [lb_av]
uE3 Value: 2.82 ng/mL

## 2016-08-18 ENCOUNTER — Ambulatory Visit (HOSPITAL_COMMUNITY)
Admission: RE | Admit: 2016-08-18 | Discharge: 2016-08-18 | Disposition: A | Discharge status: Home / Self Care | Payer: Medicaid Other | Source: Ambulatory Visit | Attending: Obstetrics & Gynecology | Admitting: Obstetrics & Gynecology

## 2016-08-18 ENCOUNTER — Other Ambulatory Visit: Payer: Self-pay | Admitting: Obstetrics & Gynecology

## 2016-08-18 DIAGNOSIS — Z3490 Encounter for supervision of normal pregnancy, unspecified, unspecified trimester: Secondary | ICD-10-CM

## 2016-08-18 DIAGNOSIS — Z3492 Encounter for supervision of normal pregnancy, unspecified, second trimester: Secondary | ICD-10-CM | POA: Diagnosis present

## 2016-08-18 DIAGNOSIS — Z3A23 23 weeks gestation of pregnancy: Secondary | ICD-10-CM

## 2016-08-18 DIAGNOSIS — O09213 Supervision of pregnancy with history of pre-term labor, third trimester: Secondary | ICD-10-CM

## 2016-08-18 DIAGNOSIS — Z8751 Personal history of pre-term labor: Secondary | ICD-10-CM | POA: Diagnosis not present

## 2016-08-25 ENCOUNTER — Ambulatory Visit (INDEPENDENT_AMBULATORY_CARE_PROVIDER_SITE_OTHER): Payer: Medicaid Other | Admitting: Certified Nurse Midwife

## 2016-08-25 ENCOUNTER — Encounter: Payer: Self-pay | Admitting: Certified Nurse Midwife

## 2016-08-25 VITALS — Wt 155.9 lb

## 2016-08-25 DIAGNOSIS — Z6791 Unspecified blood type, Rh negative: Secondary | ICD-10-CM

## 2016-08-25 DIAGNOSIS — O09899 Supervision of other high risk pregnancies, unspecified trimester: Secondary | ICD-10-CM

## 2016-08-25 DIAGNOSIS — Z3490 Encounter for supervision of normal pregnancy, unspecified, unspecified trimester: Secondary | ICD-10-CM

## 2016-08-25 DIAGNOSIS — O09212 Supervision of pregnancy with history of pre-term labor, second trimester: Secondary | ICD-10-CM

## 2016-08-25 DIAGNOSIS — O34219 Maternal care for unspecified type scar from previous cesarean delivery: Secondary | ICD-10-CM

## 2016-08-25 DIAGNOSIS — O26892 Other specified pregnancy related conditions, second trimester: Secondary | ICD-10-CM

## 2016-08-25 DIAGNOSIS — O09892 Supervision of other high risk pregnancies, second trimester: Secondary | ICD-10-CM

## 2016-08-25 DIAGNOSIS — O09292 Supervision of pregnancy with other poor reproductive or obstetric history, second trimester: Secondary | ICD-10-CM

## 2016-08-25 DIAGNOSIS — O09299 Supervision of pregnancy with other poor reproductive or obstetric history, unspecified trimester: Secondary | ICD-10-CM

## 2016-08-25 DIAGNOSIS — O09219 Supervision of pregnancy with history of pre-term labor, unspecified trimester: Secondary | ICD-10-CM

## 2016-08-25 DIAGNOSIS — E559 Vitamin D deficiency, unspecified: Secondary | ICD-10-CM

## 2016-08-25 MED ORDER — VITAMIN D (ERGOCALCIFEROL) 1.25 MG (50000 UNIT) PO CAPS: 50000.0000 [IU] | ORAL_CAPSULE | ORAL | 2 refills | Status: DC

## 2016-08-25 NOTE — Progress Notes (Signed)
Patient reports good fetal movement, denies pain. 

## 2016-08-25 NOTE — Progress Notes (Signed)
   PRENATAL VISIT NOTE  Subjective:  [name] Young is a 26 y.o. Z6X0960 at [redacted]w[redacted]d being seen today for ongoing prenatal care.  She is currently monitored for the following issues for this low-risk pregnancy and has Rh negative status during pregnancy; Vitamin D deficiency; Hx of preeclampsia, prior pregnancy, currently pregnant; Previous cesarean delivery affecting pregnancy, antepartum; History of preterm delivery, currently pregnant; and Supervision of normal pregnancy, antepartum on her problem list.  Patient reports no complaints.  Contractions: Not present. Vag. Bleeding: None.  Movement: Present. Denies leaking of fluid.   The following portions of the patient's history were reviewed and updated as appropriate: allergies, current medications, past family history, past medical history, past social history, past surgical history and problem list. Problem list updated.  Objective:   Vitals:   08/25/16 0850  Weight: 155 lb 14.4 oz (70.7 kg)    Fetal Status: Fetal Heart Rate (bpm): 143 Fundal Height: 24 cm Movement: Present     General:  Alert, oriented and cooperative. Patient is in no acute distress.  Skin: Skin is warm and dry. No rash noted.   Cardiovascular: Normal heart rate noted  Respiratory: Normal respiratory effort, no problems with respiration noted  Abdomen: Soft, gravid, appropriate for gestational age. Pain/Pressure: Absent     Pelvic:  Cervical exam deferred        Extremities: Normal range of motion.  Edema: Trace  Mental Status: Normal mood and affect. Normal behavior. Normal judgment and thought content.   Assessment and Plan:  Pregnancy: G3P1102 at [redacted]w[redacted]d  1. Encounter for supervision of normal pregnancy, antepartum, unspecified gravidity      F/U US ordered for f/u anatomy - US MFM OB FOLLOW UP; Future  2. Previous cesarean delivery affecting pregnancy, antepartum      ?TOLAC.  Dr. Macon Large performed 2 previous C-sections.  Patient really desires for TOLAC.   3.  Hx of preeclampsia, prior pregnancy, currently pregnant      Taking daily ASA  4. Rh negative status during pregnancy in second trimester     Rhogam with 2 hour OGTT next ROB.   5. Vitamin D deficiency      Vitamin D ordered - Vitamin D, Ergocalciferol, (DRISDOL) 50000 units CAPS capsule; Take 1 capsule (50,000 Units total) by mouth every 7 (seven) days.  Dispense: 30 capsule; Refill: 2  6. History of preterm delivery, currently pregnant     For Severe Pre-eclampsia with first delivery at 33 weeks.    Preterm labor symptoms and general obstetric precautions including but not limited to vaginal bleeding, contractions, leaking of fluid and fetal movement were reviewed in detail with the patient. Please refer to After Visit Summary for other counseling recommendations.  Return in about 4 weeks (around 09/22/2016) for ROB, 2 hr OGTT and Rhogam.  Desires to see Dr. Macon Large next ROB to discuss TOLAC.     Roe Coombs, CNM

## 2016-09-04 ENCOUNTER — Telehealth: Payer: Self-pay | Admitting: *Deleted

## 2016-09-04 NOTE — Telephone Encounter (Signed)
Pt called to office with concerns about her bowel movements. Pt states that her stools are really dark brown, "coffee ground" look to it.  Pt states that she has only had a few fetal movements today. Pt was advised of Fetal Kick Counts-how to preform and when she needs to be seen. Pt advised to be seen in MAU if kick counts are not within limits. Information was reviewed with R.Denney, CNM. Pt made aware that her color in stools may be normal during pregnancy.  Advised changes in stool could be due to diet, medication. Pt advised to call if any other concerns.

## 2016-09-22 ENCOUNTER — Ambulatory Visit (INDEPENDENT_AMBULATORY_CARE_PROVIDER_SITE_OTHER): Payer: Medicaid Other | Admitting: Certified Nurse Midwife

## 2016-09-22 ENCOUNTER — Other Ambulatory Visit: Payer: Medicaid Other

## 2016-09-22 ENCOUNTER — Encounter: Payer: Self-pay | Admitting: Certified Nurse Midwife

## 2016-09-22 VITALS — BP 107/69 | HR 72 | Wt 156.0 lb

## 2016-09-22 DIAGNOSIS — O09293 Supervision of pregnancy with other poor reproductive or obstetric history, third trimester: Secondary | ICD-10-CM | POA: Diagnosis not present

## 2016-09-22 DIAGNOSIS — O09893 Supervision of other high risk pregnancies, third trimester: Secondary | ICD-10-CM | POA: Diagnosis not present

## 2016-09-22 DIAGNOSIS — O09213 Supervision of pregnancy with history of pre-term labor, third trimester: Secondary | ICD-10-CM | POA: Diagnosis not present

## 2016-09-22 DIAGNOSIS — E559 Vitamin D deficiency, unspecified: Secondary | ICD-10-CM | POA: Diagnosis not present

## 2016-09-22 DIAGNOSIS — O09219 Supervision of pregnancy with history of pre-term labor, unspecified trimester: Secondary | ICD-10-CM

## 2016-09-22 DIAGNOSIS — O26893 Other specified pregnancy related conditions, third trimester: Secondary | ICD-10-CM

## 2016-09-22 DIAGNOSIS — Z3490 Encounter for supervision of normal pregnancy, unspecified, unspecified trimester: Secondary | ICD-10-CM

## 2016-09-22 DIAGNOSIS — O09899 Supervision of other high risk pregnancies, unspecified trimester: Secondary | ICD-10-CM

## 2016-09-22 DIAGNOSIS — O360131 Maternal care for anti-D [Rh] antibodies, third trimester, fetus 1: Secondary | ICD-10-CM | POA: Diagnosis not present

## 2016-09-22 DIAGNOSIS — Z6791 Unspecified blood type, Rh negative: Secondary | ICD-10-CM

## 2016-09-22 DIAGNOSIS — O09299 Supervision of pregnancy with other poor reproductive or obstetric history, unspecified trimester: Secondary | ICD-10-CM

## 2016-09-22 MED ORDER — RHO D IMMUNE GLOBULIN 1500 UNIT/2ML IJ SOSY
300.0000 ug | PREFILLED_SYRINGE | Freq: Once | INTRAMUSCULAR | Status: AC
  Administered 2016-09-22: 300 ug via INTRAMUSCULAR

## 2016-09-22 NOTE — Progress Notes (Signed)
Patient presents for ROB/GTT and Rhogam. Given in RUOQ. Tolerated well. Administrations This Visit    rho (d) immune globulin (RHIG/RHOPHYLAC) injection 300 mcg    Admin Date 09/22/2016 Action Given Dose 300 mcg Route Intramuscular Administered By Maretta BeesMcGlashan, Brevyn Ring J, RMA

## 2016-09-22 NOTE — Progress Notes (Signed)
   PRENATAL VISIT NOTE  Subjective:  [name] Young is a 26 y.o. Z6X0960G3P1102 at 5426w4d being seen today for ongoing prenatal care.  She is currently monitored for the following issues for this low-risk pregnancy and has Rh negative status during pregnancy; Vitamin D deficiency; Hx of preeclampsia, prior pregnancy, currently pregnant; Previous cesarean delivery affecting pregnancy, antepartum; History of preterm delivery, currently pregnant; and Supervision of normal pregnancy, antepartum on her problem list.  Patient reports no complaints.  Contractions: Not present. Vag. Bleeding: None.  Movement: Present. Denies leaking of fluid.   The following portions of the patient's history were reviewed and updated as appropriate: allergies, current medications, past family history, past medical history, past social history, past surgical history and problem list. Problem list updated.  Objective:   Vitals:   09/22/16 0821  BP: 107/69  Pulse: 72  Weight: 156 lb (70.8 kg)    Fetal Status: Fetal Heart Rate (bpm): 145 Fundal Height: 26 cm Movement: Present     General:  Alert, oriented and cooperative. Patient is in no acute distress.  Skin: Skin is warm and dry. No rash noted.   Cardiovascular: Normal heart rate noted  Respiratory: Normal respiratory effort, no problems with respiration noted  Abdomen: Soft, gravid, appropriate for gestational age. Pain/Pressure: Absent     Pelvic:  Cervical exam deferred        Extremities: Normal range of motion.  Edema: Trace  Mental Status: Normal mood and affect. Normal behavior. Normal judgment and thought content.   Assessment and Plan:  Pregnancy: G3P1102 at 1526w4d  1. Encounter for supervision of normal pregnancy, antepartum, unspecified gravidity      - Glucose Tolerance, 2 Hours w/1 Hour - CBC - HIV antibody (with reflex) - RPR  2. Rh negative status during pregnancy in third trimester     Rhogam given today  3. Vitamin D deficiency      Taking  vitamin D  4. Hx of preeclampsia, prior pregnancy, currently pregnant     Taking baby ASA  5. History of preterm delivery, currently pregnant     With pre-elcampsia  Preterm labor symptoms and general obstetric precautions including but not limited to vaginal bleeding, contractions, leaking of fluid and fetal movement were reviewed in detail with the patient. Please refer to After Visit Summary for other counseling recommendations.  Return in about 2 weeks (around 10/06/2016) for ROB, desires to see Dr. [name] to discuss TOLAC.   Roe Coombsachelle A Tomoya Ringwald, CNM

## 2016-09-23 LAB — CBC
HEMOGLOBIN: 9.9 g/dL — AB (ref 11.1–15.9)
Hematocrit: 32 % — ABNORMAL LOW (ref 34.0–46.6)
MCH: 26.8 pg (ref 26.6–33.0)
MCHC: 30.9 g/dL — ABNORMAL LOW (ref 31.5–35.7)
MCV: 87 fL (ref 79–97)
Platelets: 264 10*3/uL (ref 150–379)
RBC: 3.7 x10E6/uL — AB (ref 3.77–5.28)
RDW: 14.3 % (ref 12.3–15.4)
WBC: 7.6 10*3/uL (ref 3.4–10.8)

## 2016-09-23 LAB — HIV ANTIBODY (ROUTINE TESTING W REFLEX): HIV SCREEN 4TH GENERATION: NONREACTIVE

## 2016-09-23 LAB — GLUCOSE TOLERANCE, 2 HOURS W/ 1HR
GLUCOSE, 1 HOUR: 115 mg/dL (ref 65–179)
GLUCOSE, FASTING: 66 mg/dL (ref 65–91)
Glucose, 2 hour: 96 mg/dL (ref 65–152)

## 2016-09-23 LAB — RPR: RPR Ser Ql: NONREACTIVE

## 2016-09-26 ENCOUNTER — Other Ambulatory Visit: Payer: Self-pay | Admitting: Certified Nurse Midwife

## 2016-09-26 ENCOUNTER — Ambulatory Visit (HOSPITAL_COMMUNITY)
Admission: RE | Admit: 2016-09-26 | Discharge: 2016-09-26 | Disposition: A | Discharge status: Home / Self Care | Payer: Medicaid Other | Source: Ambulatory Visit | Attending: Certified Nurse Midwife | Admitting: Certified Nurse Midwife

## 2016-09-26 DIAGNOSIS — Z3493 Encounter for supervision of normal pregnancy, unspecified, third trimester: Secondary | ICD-10-CM | POA: Diagnosis present

## 2016-09-26 DIAGNOSIS — Z362 Encounter for other antenatal screening follow-up: Secondary | ICD-10-CM | POA: Insufficient documentation

## 2016-09-26 DIAGNOSIS — Z3480 Encounter for supervision of other normal pregnancy, unspecified trimester: Secondary | ICD-10-CM

## 2016-09-26 DIAGNOSIS — Z3A29 29 weeks gestation of pregnancy: Secondary | ICD-10-CM | POA: Insufficient documentation

## 2016-09-26 DIAGNOSIS — O09299 Supervision of pregnancy with other poor reproductive or obstetric history, unspecified trimester: Secondary | ICD-10-CM | POA: Insufficient documentation

## 2016-09-26 DIAGNOSIS — Z3490 Encounter for supervision of normal pregnancy, unspecified, unspecified trimester: Secondary | ICD-10-CM

## 2016-09-26 DIAGNOSIS — O09219 Supervision of pregnancy with history of pre-term labor, unspecified trimester: Secondary | ICD-10-CM | POA: Insufficient documentation

## 2016-10-12 ENCOUNTER — Ambulatory Visit (INDEPENDENT_AMBULATORY_CARE_PROVIDER_SITE_OTHER): Payer: Medicaid Other | Admitting: Obstetrics and Gynecology

## 2016-10-12 VITALS — BP 116/80 | HR 88 | Wt 161.0 lb

## 2016-10-12 DIAGNOSIS — O09893 Supervision of other high risk pregnancies, third trimester: Secondary | ICD-10-CM

## 2016-10-12 DIAGNOSIS — O34219 Maternal care for unspecified type scar from previous cesarean delivery: Secondary | ICD-10-CM | POA: Diagnosis not present

## 2016-10-12 DIAGNOSIS — Z3480 Encounter for supervision of other normal pregnancy, unspecified trimester: Secondary | ICD-10-CM

## 2016-10-12 DIAGNOSIS — O26893 Other specified pregnancy related conditions, third trimester: Principal | ICD-10-CM

## 2016-10-12 DIAGNOSIS — Z3483 Encounter for supervision of other normal pregnancy, third trimester: Secondary | ICD-10-CM | POA: Diagnosis not present

## 2016-10-12 DIAGNOSIS — O09213 Supervision of pregnancy with history of pre-term labor, third trimester: Secondary | ICD-10-CM

## 2016-10-12 DIAGNOSIS — O09299 Supervision of pregnancy with other poor reproductive or obstetric history, unspecified trimester: Secondary | ICD-10-CM

## 2016-10-12 DIAGNOSIS — O09293 Supervision of pregnancy with other poor reproductive or obstetric history, third trimester: Secondary | ICD-10-CM

## 2016-10-12 DIAGNOSIS — O09899 Supervision of other high risk pregnancies, unspecified trimester: Secondary | ICD-10-CM

## 2016-10-12 DIAGNOSIS — O09219 Supervision of pregnancy with history of pre-term labor, unspecified trimester: Secondary | ICD-10-CM

## 2016-10-12 DIAGNOSIS — Z6791 Unspecified blood type, Rh negative: Secondary | ICD-10-CM

## 2016-10-12 NOTE — Progress Notes (Signed)
   PRENATAL VISIT NOTE  Subjective:  [name] Young is a 26 y.o. Z6X0960G3P1102 at 6011w3d being seen today for ongoing prenatal care.  She is currently monitored for the following issues for this low-risk pregnancy and has Rh negative status during pregnancy; Vitamin D deficiency; Hx of preeclampsia, prior pregnancy, currently pregnant; Previous cesarean delivery affecting pregnancy, antepartum; History of preterm delivery, currently pregnant; and Supervision of normal pregnancy, antepartum on her problem list.  Patient reports no complaints.  Contractions: Not present. Vag. Bleeding: None.  Movement: Present. Denies leaking of fluid.   The following portions of the patient's history were reviewed and updated as appropriate: allergies, current medications, past family history, past medical history, past social history, past surgical history and problem list. Problem list updated.  Objective:   Vitals:   10/12/16 0838  BP: 116/80  Pulse: 88  Weight: 161 lb (73 kg)    Fetal Status: Fetal Heart Rate (bpm): 140 Fundal Height: 30 cm Movement: Present     General:  Alert, oriented and cooperative. Patient is in no acute distress.  Skin: Skin is warm and dry. No rash noted.   Cardiovascular: Normal heart rate noted  Respiratory: Normal respiratory effort, no problems with respiration noted  Abdomen: Soft, gravid, appropriate for gestational age. Pain/Pressure: Present     Pelvic:  Cervical exam deferred        Extremities: Normal range of motion.     Mental Status: Normal mood and affect. Normal behavior. Normal judgment and thought content.   Assessment and Plan:  Pregnancy: G3P1102 at 7811w3d  1. Rh negative status during pregnancy in third trimester S/p rhogam  2. History of preterm delivery, currently pregnant Due to preeclampsia  3. Previous cesarean delivery affecting pregnancy, antepartum Previous c-section x 2. [facility] Risks of [facility]AC following 2 c-section reviewed. It was  explained to the patient that if she presented with spontaneous onset of labor we would let her have a TOLAC, however, we will not induce her labor following 2 previous c-sections. Patient verbalized understanding and all questions were answered This is her last planned pregnancy and she desires to experience a vaginal birth. She was very tearful during my counseling session on this topic  4. Supervision of other normal pregnancy, antepartum Patient is doing well without complaints  5. Hx of preeclampsia, prior pregnancy, currently pregnant Continue ASA  Preterm labor symptoms and general obstetric precautions including but not limited to vaginal bleeding, contractions, leaking of fluid and fetal movement were reviewed in detail with the patient. Please refer to After Visit Summary for other counseling recommendations.  Return in about 2 weeks (around 10/26/2016) for ROB.   Catalina AntiguaPeggy Arina Torry, MD

## 2016-10-12 NOTE — Progress Notes (Signed)
Pt states that she noticed some rectal mucous with bowel movements. Discuss TOLAC vs need for c/s.

## 2016-10-26 ENCOUNTER — Ambulatory Visit (INDEPENDENT_AMBULATORY_CARE_PROVIDER_SITE_OTHER): Payer: Medicaid Other | Admitting: Obstetrics and Gynecology

## 2016-10-26 VITALS — BP 122/72 | HR 84 | Wt 161.4 lb

## 2016-10-26 DIAGNOSIS — O09893 Supervision of other high risk pregnancies, third trimester: Secondary | ICD-10-CM

## 2016-10-26 DIAGNOSIS — O34219 Maternal care for unspecified type scar from previous cesarean delivery: Secondary | ICD-10-CM | POA: Diagnosis not present

## 2016-10-26 DIAGNOSIS — Z3480 Encounter for supervision of other normal pregnancy, unspecified trimester: Secondary | ICD-10-CM

## 2016-10-26 DIAGNOSIS — Z6791 Unspecified blood type, Rh negative: Secondary | ICD-10-CM

## 2016-10-26 DIAGNOSIS — O09293 Supervision of pregnancy with other poor reproductive or obstetric history, third trimester: Secondary | ICD-10-CM

## 2016-10-26 DIAGNOSIS — O09899 Supervision of other high risk pregnancies, unspecified trimester: Secondary | ICD-10-CM

## 2016-10-26 DIAGNOSIS — O26893 Other specified pregnancy related conditions, third trimester: Secondary | ICD-10-CM

## 2016-10-26 DIAGNOSIS — O09299 Supervision of pregnancy with other poor reproductive or obstetric history, unspecified trimester: Secondary | ICD-10-CM

## 2016-10-26 DIAGNOSIS — O09213 Supervision of pregnancy with history of pre-term labor, third trimester: Secondary | ICD-10-CM | POA: Diagnosis not present

## 2016-10-26 DIAGNOSIS — O09219 Supervision of pregnancy with history of pre-term labor, unspecified trimester: Secondary | ICD-10-CM

## 2016-10-26 NOTE — Progress Notes (Signed)
   PRENATAL VISIT NOTE  Subjective:  [name] Young is a 26 y.o. [id number] at 7442w3d being seen today for ongoing prenatal care.  She is currently monitored for the following issues for this high-risk pregnancy and has Rh negative status during pregnancy; Vitamin D deficiency; Hx of preeclampsia, prior pregnancy, currently pregnant; Previous cesarean delivery affecting pregnancy, antepartum; History of preterm delivery, currently pregnant; and Supervision of normal pregnancy, antepartum on her problem list.  Patient reports no complaints.  Contractions: Not present. Vag. Bleeding: None.  Movement: Present. Denies leaking of fluid.   The following portions of the patient's history were reviewed and updated as appropriate: allergies, current medications, past family history, past medical history, past social history, past surgical history and problem list. Problem list updated.  Objective:   Vitals:   10/26/16 1122  BP: 122/72  Pulse: 84  Weight: 161 lb 6.4 oz (73.2 kg)    Fetal Status: Fetal Heart Rate (bpm): 175 Fundal Height: 33 cm Movement: Present     General:  Alert, oriented and cooperative. Patient is in no acute distress.  Skin: Skin is warm and dry. No rash noted.   Cardiovascular: Normal heart rate noted  Respiratory: Normal respiratory effort, no problems with respiration noted  Abdomen: Soft, gravid, appropriate for gestational age. Pain/Pressure: Present     Pelvic:  Cervical exam deferred        Extremities: Normal range of motion.  Edema: Trace  Mental Status: Normal mood and affect. Normal behavior. Normal judgment and thought content.   Assessment and Plan:  Pregnancy: G3P1102 at 2042w3d  1. Hx of preeclampsia, prior pregnancy, currently pregnant Continue ASA Precautions reviewed  2. Rh negative status during pregnancy in third trimester S/p rhogam  3. History of preterm delivery, currently pregnant Due to preeclampsia  4. Previous cesarean delivery affecting  pregnancy, antepartum Opted for scheduled c-section which patient reports is scheduled on 8/6 at 39 weeks  5. Supervision of other normal pregnancy, antepartum Patient is doing well without complaints  Preterm labor symptoms and general obstetric precautions including but not limited to vaginal bleeding, contractions, leaking of fluid and fetal movement were reviewed in detail with the patient. Please refer to After Visit Summary for other counseling recommendations.  Return in about 2 weeks (around 11/09/2016) for ROB.   Catalina AntiguaPeggy Orilla Templeman, MD

## 2016-11-08 ENCOUNTER — Ambulatory Visit (INDEPENDENT_AMBULATORY_CARE_PROVIDER_SITE_OTHER): Payer: Medicaid Other | Admitting: Obstetrics & Gynecology

## 2016-11-08 VITALS — BP 132/81 | HR 84 | Wt 162.0 lb

## 2016-11-08 DIAGNOSIS — Z3483 Encounter for supervision of other normal pregnancy, third trimester: Secondary | ICD-10-CM

## 2016-11-08 DIAGNOSIS — O34219 Maternal care for unspecified type scar from previous cesarean delivery: Secondary | ICD-10-CM

## 2016-11-08 DIAGNOSIS — Z3480 Encounter for supervision of other normal pregnancy, unspecified trimester: Secondary | ICD-10-CM

## 2016-11-08 NOTE — Progress Notes (Signed)
   PRENATAL VISIT NOTE  Subjective:  [name] Young is a 26 y.o. [id number] at 6128w2d being seen today for ongoing prenatal care.  She is currently monitored for the following issues for this high-risk pregnancy and has Rh negative status during pregnancy; Vitamin D deficiency; Hx of preeclampsia, prior pregnancy, currently pregnant; Previous cesarean delivery affecting pregnancy, antepartum; History of preterm delivery, currently pregnant; and Supervision of normal pregnancy, antepartum on her problem list.  Patient reports no complaints.  Contractions: Not present. Vag. Bleeding: None.  Movement: Present. Denies leaking of fluid.   The following portions of the patient's history were reviewed and updated as appropriate: allergies, current medications, past family history, past medical history, past social history, past surgical history and problem list. Problem list updated.  Objective:   Vitals:   11/08/16 1533  BP: 132/81  Pulse: 84  Weight: 162 lb (73.5 kg)    Fetal Status: Fetal Heart Rate (bpm): 174   Movement: Present     General:  Alert, oriented and cooperative. Patient is in no acute distress.  Skin: Skin is warm and dry. No rash noted.   Cardiovascular: Normal heart rate noted  Respiratory: Normal respiratory effort, no problems with respiration noted  Abdomen: Soft, gravid, appropriate for gestational age. Pain/Pressure: Present     Pelvic:  Cervical exam deferred        Extremities: Normal range of motion.  Edema: Trace  Mental Status: Normal mood and affect. Normal behavior. Normal judgment and thought content.   Assessment and Plan:  Pregnancy: G3P1102 at 2128w2d  1. Supervision of other normal pregnancy, antepartum   2. Previous cesarean delivery affecting pregnancy, antepartum Considered TOLAC  Preterm labor symptoms and general obstetric precautions including but not limited to vaginal bleeding, contractions, leaking of fluid and fetal movement were reviewed in detail  with the patient. Please refer to After Visit Summary for other counseling recommendations.  1 week  Scheryl DarterJames Carletha Dawn, MD

## 2016-11-08 NOTE — Patient Instructions (Signed)
Vaginal Birth After Cesarean Delivery Vaginal birth after cesarean delivery (VBAC) is giving birth vaginally after previously delivering a baby by a cesarean. In the past, if a woman had a cesarean delivery, all births afterward would be done by cesarean delivery. This is no longer true. It can be safe for the mother to try a vaginal delivery after having a cesarean delivery. It is important to discuss VBAC with your health care provider early in the pregnancy so you can understand the risks, benefits, and options. It will give you time to decide what is best in your particular case. The final decision about whether to have a VBAC or repeat cesarean delivery should be between you and your health care provider. Any changes in your health or your baby's health during your pregnancy may make it necessary to change your initial decision about VBAC. Women who plan to have a VBAC should check with their health care provider to be sure that:  The previous cesarean delivery was done with a low transverse uterine cut (incision) (not a vertical classical incision).  The birth canal is big enough for the baby.  There were no other operations on the uterus.  An electronic fetal monitor (EFM) will be on at all times during labor.  An operating room will be available and ready in case an emergency cesarean delivery is needed.  A health care provider and surgical nursing staff will be available at all times during labor to be ready to do an emergency delivery cesarean if necessary.  An anesthesiologist will be present in case an emergency cesarean delivery is needed.  The nursery is prepared and has adequate personnel and necessary equipment available to care for the baby in case of an emergency cesarean delivery. Benefits of VBAC  Shorter stay in the hospital.  Avoidance of risks associated with cesarean delivery, such as: ? Surgical complications, such as opening of the incision or hernia in the  incision. ? Injury to other organs. ? Fever. This can occur if an infection develops after surgery. It can also occur as a reaction to the medicine given to make you numb during the surgery.  Less blood loss and need for blood transfusions.  Lower risk of blood clots and infection.  Shorter recovery.  Decreased risk for having to remove the uterus (hysterectomy).  Decreased risk for the placenta to completely or partially cover the opening of the uterus (placenta previa) with a future pregnancy.  Decrease risk in future labor and delivery. Risks of a VBAC  Tearing (rupture) of the uterus. This is occurs in less than 1% of VBACs. The risk of this happening is higher if: ? Steps are taken to begin the labor process (induce labor) or stimulate or strengthen contractions (augment labor). ? Medicine is used to soften (ripen) the cervix.  Having to remove the uterus (hysterectomy) if it ruptures. VBAC should not be done if:  The previous cesarean delivery was done with a vertical (classical) or T-shaped incision or you do not know what kind of incision was made.  You had a ruptured uterus.  You have had certain types of surgery on your uterus, such as removal of uterine fibroids. Ask your health care provider about other types of surgeries that prevent you from having a VBAC.  You have certain medical or childbirth (obstetrical) problems.  There are problems with the baby.  You have had two previous cesarean deliveries and no vaginal deliveries. Other facts to know about VBAC:  It   is safe to have an epidural anesthetic with VBAC.  It is safe to turn the baby from a breech position (attempt an external cephalic version).  It is safe to try a VBAC with twins.  VBAC may not be successful if your baby weights 8.8 lb (4 kg) or more. However, weight predictions are not always accurate and should not be used alone to decide if VBAC is right for you.  There is an increased failure rate  if the time between the cesarean delivery and VBAC is less than 19 months.  Your health care provider may advise against a VBAC if you have preeclampsia (high blood pressure, protein in the urine, and swelling of face and extremities).  VBAC is often successful if you previously gave birth vaginally.  VBAC is often successful when the labor starts spontaneously before the due date.  Delivering a baby through a VBAC is similar to having a normal spontaneous vaginal delivery. This information is not intended to replace advice given to you by your health care provider. Make sure you discuss any questions you have with your health care provider. Document Released: 10/08/2006 Document Revised: 09/23/2015 Document Reviewed: 11/14/2012 Elsevier Interactive Patient Education  2018 [employer]  

## 2016-11-15 ENCOUNTER — Ambulatory Visit (INDEPENDENT_AMBULATORY_CARE_PROVIDER_SITE_OTHER): Payer: Medicaid Other | Admitting: Obstetrics & Gynecology

## 2016-11-15 ENCOUNTER — Encounter (HOSPITAL_COMMUNITY): Payer: Self-pay

## 2016-11-15 ENCOUNTER — Other Ambulatory Visit (HOSPITAL_COMMUNITY)
Admission: RE | Admit: 2016-11-15 | Discharge: 2016-11-15 | Disposition: A | Discharge status: Home / Self Care | Payer: Medicaid Other | Source: Ambulatory Visit | Attending: Obstetrics & Gynecology | Admitting: Obstetrics & Gynecology

## 2016-11-15 VITALS — BP 130/84 | HR 87 | Wt 161.9 lb

## 2016-11-15 DIAGNOSIS — Z3483 Encounter for supervision of other normal pregnancy, third trimester: Secondary | ICD-10-CM | POA: Diagnosis not present

## 2016-11-15 DIAGNOSIS — O34219 Maternal care for unspecified type scar from previous cesarean delivery: Secondary | ICD-10-CM | POA: Diagnosis not present

## 2016-11-15 DIAGNOSIS — Z3480 Encounter for supervision of other normal pregnancy, unspecified trimester: Secondary | ICD-10-CM | POA: Diagnosis present

## 2016-11-15 NOTE — Patient Instructions (Signed)
Cesarean Delivery °Cesarean birth, or cesarean delivery, is the surgical delivery of a baby through an incision in the abdomen and the uterus. This may be referred to as a C-section. This procedure may be scheduled ahead of time, or it may be done in an emergency situation. °Tell a health care provider about: °· Any allergies you have. °· All medicines you are taking, including vitamins, herbs, eye drops, creams, and over-the-counter medicines. °· Any problems you or family members have had with anesthetic medicines. °· Any blood disorders you have. °· Any surgeries you have had. °· Any medical conditions you have. °· Whether you or any members of your family have a history of deep vein thrombosis (DVT) or pulmonary embolism (PE). °What are the risks? °Generally, this is a safe procedure. However, problems may occur, including: °· Infection. °· Bleeding. °· Allergic reactions to medicines. °· Damage to other structures or organs. °· Blood clots. °· Injury to your baby. ° °What happens before the procedure? °· Follow instructions from your health care provider about eating or drinking restrictions. °· Follow instructions from your health care provider about bathing before your procedure to help reduce your risk of infection. °· If you know that you are going to have a cesarean delivery, do not shave your pubic area. Shaving before the procedure may increase your risk of infection. °· Ask your health care provider about: °? Changing or stopping your regular medicines. This is especially important if you are taking diabetes medicines or blood thinners. °? Your pain management plan. This is especially important if you plan to breastfeed your baby. °? How long you will be in the hospital after the procedure. °? Any concerns you may have about receiving blood products if you need them during the procedure. °? Cord blood banking, if you plan to collect your baby’s umbilical cord blood. °· You may also want to ask your  health care provider: °? Whether you will be able to hold or breastfeed your baby while you are still in the operating room. °? Whether your baby can stay with you immediately after the procedure and during your recovery. °? Whether a family member or a person of your choice can go with you into the operating room and stay with you during the procedure, immediately after the procedure, and during your recovery. °· Plan to have someone drive you home when you are discharged from the hospital. °What happens during the procedure? °· Fetal monitors will be placed on your abdomen to monitor your heart rate and your baby's heart rate. °· Depending on the reason for your cesarean delivery, you may have a physical exam or additional testing, such as an ultrasound. °· An IV tube will be inserted into one of your veins. °· You may have your blood or urine tested. °· You will be given antibiotic medicine to help prevent infection. °· You may be given a special warming gown to wear to keep your temperature stable. °· Hair may be removed from your pubic area. °· The skin of your pubic area and lower abdomen will be cleaned with a germ-killing solution (antiseptic). °· A catheter may be inserted into your bladder through your urethra. This drains your urine during the procedure. °· You may be given one or more of the following: °? A medicine to numb the area (local anesthetic). °? A medicine to make you fall asleep (general anesthetic). °? A medicine (regional anesthetic) that is injected into your back or through a small   thin tube placed in your back (spinal anesthetic or epidural anesthetic). This numbs everything below the injection site and allows you to stay awake during your procedure. If this makes you feel nauseous, tell your health care provider. Medicines will be available to help reduce any nausea you may feel. °· An incision will be made in your abdomen, and then in your uterus. °· If you are awake during your  procedure, you may feel tugging and pulling in your abdomen, but you should not feel pain. If you feel pain, tell your health care provider immediately. °· Your baby will be removed from your uterus. You may feel more pressure or pushing while this happens. °· Immediately after birth, your baby will be dried and kept warm. You may be able to hold and breastfeed your baby. The umbilical cord may be clamped and cut during this time. °· Your placenta will be removed from your uterus. °· Your incisions will be closed with stitches (sutures). Staples, skin glue, or adhesive strips may also be applied to the incision in your abdomen. °· Bandages (dressings) will be placed over the incision in your abdomen. °The procedure may vary among health care providers and hospitals. °What happens after the procedure? °· Your blood pressure, heart rate, breathing rate, and blood oxygen level will be monitored often until the medicines you were given have worn off. °· You may continue to receive fluids and medicines through an IV tube. °· You will have some pain. Medicines will be available to help control your pain. °· To help prevent blood clots: °? You may be given medicines. °? You may have to wear compression stockings or devices. °? You will be encouraged to walk around when you are able. °· Hospital staff will encourage and support bonding with your baby. Your hospital may allow you and your baby to stay in the same room (rooming in) during your hospital stay to encourage successful breastfeeding. °· You may be encouraged to cough and breathe deeply often. This helps to prevent lung problems. °· If you have a catheter draining your urine, it will be removed as soon as possible after your procedure. °This information is not intended to replace advice given to you by your health care provider. Make sure you discuss any questions you have with your health care provider. °Document Released: 04/17/2005 Document Revised: 09/23/2015  Document Reviewed: 01/26/2015 °Elsevier Interactive Patient Education © 2017 Elsevier Inc. ° °

## 2016-11-15 NOTE — Progress Notes (Signed)
   PRENATAL VISIT NOTE  Subjective:  [name] Young is a 26 y.o. Z6X0960G3P1102 at 6250w2d being seen today for ongoing prenatal care.  She is currently monitored for the following issues for this low-risk pregnancy and has Rh negative status during pregnancy; Vitamin D deficiency; Hx of preeclampsia, prior pregnancy, currently pregnant; Previous cesarean delivery affecting pregnancy, antepartum; History of preterm delivery, currently pregnant; and Supervision of normal pregnancy, antepartum on her problem list.  Patient reports pressure.  Contractions: Not present. Vag. Bleeding: None.  Movement: Present. Denies leaking of fluid.   The following portions of the patient's history were reviewed and updated as appropriate: allergies, current medications, past family history, past medical history, past social history, past surgical history and problem list. Problem list updated.  Objective:   Vitals:   11/15/16 0842  BP: 130/84  Pulse: 87  Weight: 161 lb 14.4 oz (73.4 kg)    Fetal Status: Fetal Heart Rate (bpm): 138 Fundal Height: 35 cm Movement: Present     General:  Alert, oriented and cooperative. Patient is in no acute distress.  Skin: Skin is warm and dry. No rash noted.   Cardiovascular: Normal heart rate noted  Respiratory: Normal respiratory effort, no problems with respiration noted  Abdomen: Soft, gravid, appropriate for gestational age.  Pain/Pressure: Present     Pelvic: Cervical exam performed        Extremities: Normal range of motion.  Edema: Trace  Mental Status:  Normal mood and affect. Normal behavior. Normal judgment and thought content.   Assessment and Plan:  Pregnancy: G3P1102 at 4650w2d  1. Supervision of other normal pregnancy, antepartum Schedule repeat cesarean 39 weeks - Strep Gp B NAA - Urine cytology ancillary only  2. Previous cesarean delivery affecting pregnancy, antepartum Elects repeat  Preterm labor symptoms and general obstetric precautions including but  not limited to vaginal bleeding, contractions, leaking of fluid and fetal movement were reviewed in detail with the patient. Please refer to After Visit Summary for other counseling recommendations.  Return in about 1 week (around 11/22/2016).   Scheryl DarterJames Luvena Wentling, MD

## 2016-11-16 LAB — URINE CYTOLOGY ANCILLARY ONLY
Chlamydia: NEGATIVE
Neisseria Gonorrhea: NEGATIVE

## 2016-11-17 LAB — STREP GP B NAA: Strep Gp B NAA: NEGATIVE

## 2016-11-22 ENCOUNTER — Ambulatory Visit (INDEPENDENT_AMBULATORY_CARE_PROVIDER_SITE_OTHER): Payer: Medicaid Other | Admitting: Certified Nurse Midwife

## 2016-11-22 ENCOUNTER — Encounter (HOSPITAL_COMMUNITY): Payer: Self-pay

## 2016-11-22 VITALS — BP 112/75 | HR 95 | Wt 161.0 lb

## 2016-11-22 DIAGNOSIS — Z6791 Unspecified blood type, Rh negative: Secondary | ICD-10-CM

## 2016-11-22 DIAGNOSIS — O09299 Supervision of pregnancy with other poor reproductive or obstetric history, unspecified trimester: Secondary | ICD-10-CM

## 2016-11-22 DIAGNOSIS — O09219 Supervision of pregnancy with history of pre-term labor, unspecified trimester: Secondary | ICD-10-CM

## 2016-11-22 DIAGNOSIS — Z3480 Encounter for supervision of other normal pregnancy, unspecified trimester: Secondary | ICD-10-CM

## 2016-11-22 DIAGNOSIS — O09893 Supervision of other high risk pregnancies, third trimester: Secondary | ICD-10-CM

## 2016-11-22 DIAGNOSIS — Z3483 Encounter for supervision of other normal pregnancy, third trimester: Secondary | ICD-10-CM

## 2016-11-22 DIAGNOSIS — E559 Vitamin D deficiency, unspecified: Secondary | ICD-10-CM

## 2016-11-22 DIAGNOSIS — O26893 Other specified pregnancy related conditions, third trimester: Secondary | ICD-10-CM

## 2016-11-22 DIAGNOSIS — O34219 Maternal care for unspecified type scar from previous cesarean delivery: Secondary | ICD-10-CM

## 2016-11-22 DIAGNOSIS — O09899 Supervision of other high risk pregnancies, unspecified trimester: Secondary | ICD-10-CM

## 2016-11-22 DIAGNOSIS — O09293 Supervision of pregnancy with other poor reproductive or obstetric history, third trimester: Secondary | ICD-10-CM

## 2016-11-22 DIAGNOSIS — O09213 Supervision of pregnancy with history of pre-term labor, third trimester: Secondary | ICD-10-CM

## 2016-11-22 NOTE — Progress Notes (Signed)
   PRENATAL VISIT NOTE  Subjective:  [name] Young is a 26 y.o. [id number] at 8034w2d being seen today for ongoing prenatal care.  She is currently monitored for the following issues for this low-risk pregnancy and has Rh negative status during pregnancy; Vitamin D deficiency; Hx of preeclampsia, prior pregnancy, currently pregnant; Previous cesarean delivery affecting pregnancy, antepartum; History of preterm delivery, currently pregnant; and Supervision of normal pregnancy, antepartum on her problem list.  Patient reports no bleeding, no leaking, occasional contractions and pelvic pressure, desires for TOLAC, repeat C-section scheduled for 12/05/16.  Contractions: Irregular. Vag. Bleeding: None.  Movement: Present. Denies leaking of fluid.   The following portions of the patient's history were reviewed and updated as appropriate: allergies, current medications, past family history, past medical history, past social history, past surgical history and problem list. Problem list updated.  Objective:   Vitals:   11/22/16 0946  BP: 112/75  Pulse: 95  Weight: 161 lb (73 kg)    Fetal Status: Fetal Heart Rate (bpm): 135 Fundal Height: 34 cm Movement: Present  Presentation: Vertex  General:  Alert, oriented and cooperative. Patient is in no acute distress.  Skin: Skin is warm and dry. No rash noted.   Cardiovascular: Normal heart rate noted  Respiratory: Normal respiratory effort, no problems with respiration noted  Abdomen: Soft, gravid, appropriate for gestational age.  Pain/Pressure: Present     Pelvic: Cervical exam performed Dilation: 1.5 Effacement (%): 20 Station: -2  Extremities: Normal range of motion.     Mental Status:  Normal mood and affect. Normal behavior. Normal judgment and thought content.   Assessment and Plan:  Pregnancy: G3P1102 at 1834w2d  1. Supervision of other normal pregnancy, antepartum     Doing well  2. Rh negative status during pregnancy in third trimester     Rhogam  given 09/22/16  3. Vitamin D deficiency     Taking weekly vitamin D  4. Hx of preeclampsia, prior pregnancy, currently pregnant      Completed baby asa tx. Normotensive today  5. Previous cesarean delivery affecting pregnancy, antepartum      Previous C-section X2: 1st for severe preeclampsia, 2nd for IUGR @37  weeks  6. History of preterm delivery, currently pregnant     Preeclampsia @33wks   Term labor symptoms and general obstetric precautions including but not limited to vaginal bleeding, contractions, leaking of fluid and fetal movement were reviewed in detail with the patient. Please refer to After Visit Summary for other counseling recommendations.  Return in about 1 week (around 11/29/2016) for ROB.   Roe Coombsachelle A Kailyn Vanderslice, CNM

## 2016-11-23 ENCOUNTER — Other Ambulatory Visit: Payer: Self-pay | Admitting: Family Medicine

## 2016-11-27 ENCOUNTER — Telehealth: Payer: Self-pay

## 2016-11-27 NOTE — Telephone Encounter (Signed)
Pt called stating that she is experiencing really bad vaginal pain. She request for a sooner appt. Pt advised to go to [facility] for evaluation due to not having any open appts to be seen sooner.

## 2016-11-29 ENCOUNTER — Encounter: Payer: Medicaid Other | Admitting: Certified Nurse Midwife

## 2016-12-04 ENCOUNTER — Encounter (HOSPITAL_COMMUNITY)
Admission: RE | Admit: 2016-12-04 | Discharge: 2016-12-04 | Disposition: A | Discharge status: Home / Self Care | Payer: Medicaid Other | Source: Ambulatory Visit | Attending: Family Medicine | Admitting: Family Medicine

## 2016-12-04 HISTORY — DX: Personal history of Methicillin resistant Staphylococcus aureus infection: Z86.14

## 2016-12-04 LAB — CBC
HCT: 32.3 % — ABNORMAL LOW (ref 36.0–46.0)
HEMOGLOBIN: 10.3 g/dL — AB (ref 12.0–15.0)
MCH: 26 pg (ref 26.0–34.0)
MCHC: 31.9 g/dL (ref 30.0–36.0)
MCV: 81.6 fL (ref 78.0–100.0)
PLATELETS: 270 10*3/uL (ref 150–400)
RBC: 3.96 MIL/uL (ref 3.87–5.11)
RDW: 14.8 % (ref 11.5–15.5)
WBC: 7.3 10*3/uL (ref 4.0–10.5)

## 2016-12-04 LAB — RAPID HIV SCREEN (HIV 1/2 AB+AG)
HIV 1/2 Antibodies: NONREACTIVE
HIV-1 P24 ANTIGEN - HIV24: NONREACTIVE

## 2016-12-04 LAB — TYPE AND SCREEN
ABO/RH(D): B NEG
ANTIBODY SCREEN: NEGATIVE

## 2016-12-04 LAB — MRSA PCR SCREENING: MRSA BY PCR: NEGATIVE

## 2016-12-04 NOTE — Patient Instructions (Signed)
[address]  12/04/2016   Your procedure is scheduled on:  12/05/2016  Enter through the Main Entrance of [facility] at 0930 AM.  Pick up the phone at the desk and dial [phone].   Call this number if you have problems the morning of surgery: [phone]   Remember:   Do not eat food:After Midnight.  Do not drink clear liquids: After Midnight.  Take these medicines the morning of surgery with A SIP OF WATER: none   Do not wear jewelry, make-up or nail polish.  Do not wear lotions, powders, or perfumes. Do not wear deodorant.  Do not shave 48 hours prior to surgery.  Do not bring valuables to the hospital.  [facility] not   responsible for any belongings or valuables brought to the hospital.  Contacts, dentures or bridgework may not be worn into surgery.  Leave suitcase in the car. After surgery it may be brought to your room.  For patients admitted to the hospital, checkout time is 11:00 AM the day of              discharge.   Patients discharged the day of surgery will not be allowed to drive             home.  Name and phone number of your driver: na  Special Instructions:   N/A   Please read over the following fact sheets that you were given:   MRSA Information

## 2016-12-05 ENCOUNTER — Inpatient Hospital Stay (HOSPITAL_COMMUNITY)
Admission: RE | Admit: 2016-12-05 | Discharge: 2016-12-08 | DRG: 766 | Disposition: A | Discharge status: Home / Self Care | Payer: Medicaid Other | Source: Ambulatory Visit | Attending: Family Medicine | Admitting: Family Medicine

## 2016-12-05 ENCOUNTER — Inpatient Hospital Stay (HOSPITAL_COMMUNITY): Payer: Medicaid Other | Admitting: Anesthesiology

## 2016-12-05 ENCOUNTER — Encounter (HOSPITAL_COMMUNITY): Payer: Self-pay

## 2016-12-05 ENCOUNTER — Encounter (HOSPITAL_COMMUNITY)
Admission: RE | Disposition: A | Discharge status: Home / Self Care | Payer: Self-pay | Source: Ambulatory Visit | Attending: Family Medicine

## 2016-12-05 DIAGNOSIS — O26893 Other specified pregnancy related conditions, third trimester: Secondary | ICD-10-CM | POA: Diagnosis present

## 2016-12-05 DIAGNOSIS — O1404 Mild to moderate pre-eclampsia, complicating childbirth: Secondary | ICD-10-CM | POA: Diagnosis present

## 2016-12-05 DIAGNOSIS — Z7982 Long term (current) use of aspirin: Secondary | ICD-10-CM | POA: Diagnosis not present

## 2016-12-05 DIAGNOSIS — Z8614 Personal history of Methicillin resistant Staphylococcus aureus infection: Secondary | ICD-10-CM | POA: Diagnosis not present

## 2016-12-05 DIAGNOSIS — Z98891 History of uterine scar from previous surgery: Secondary | ICD-10-CM

## 2016-12-05 DIAGNOSIS — O34211 Maternal care for low transverse scar from previous cesarean delivery: Principal | ICD-10-CM | POA: Diagnosis present

## 2016-12-05 DIAGNOSIS — Z3A39 39 weeks gestation of pregnancy: Secondary | ICD-10-CM | POA: Diagnosis not present

## 2016-12-05 DIAGNOSIS — Z6791 Unspecified blood type, Rh negative: Secondary | ICD-10-CM | POA: Diagnosis not present

## 2016-12-05 LAB — RPR: RPR: NONREACTIVE

## 2016-12-05 SURGERY — Surgical Case
Anesthesia: Spinal

## 2016-12-05 MED ORDER — DIPHENHYDRAMINE HCL 25 MG PO CAPS
25.0000 mg | ORAL_CAPSULE | Freq: Four times a day (QID) | ORAL | Status: DC | PRN
  Filled 2016-12-05: qty 1

## 2016-12-05 MED ORDER — PHENYLEPHRINE 8 MG IN D5W 100 ML (0.08MG/ML) PREMIX OPTIME
INJECTION | INTRAVENOUS | Status: DC | PRN
  Administered 2016-12-05: 60 ug/min via INTRAVENOUS

## 2016-12-05 MED ORDER — SODIUM CHLORIDE 0.9 % IR SOLN
Status: DC | PRN
  Administered 2016-12-05: 200 mL

## 2016-12-05 MED ORDER — HYDROMORPHONE HCL 1 MG/ML IJ SOLN: 0.2500 mg | INTRAMUSCULAR | Status: DC | PRN

## 2016-12-05 MED ORDER — OXYTOCIN 40 UNITS IN LACTATED RINGERS INFUSION - SIMPLE MED: 2.5000 [IU]/h | INTRAVENOUS | Status: AC

## 2016-12-05 MED ORDER — ONDANSETRON HCL 4 MG/2ML IJ SOLN
INTRAMUSCULAR | Status: AC
  Filled 2016-12-05: qty 2

## 2016-12-05 MED ORDER — ONDANSETRON HCL 4 MG/2ML IJ SOLN
INTRAMUSCULAR | Status: DC | PRN
  Administered 2016-12-05: 4 mg via INTRAVENOUS

## 2016-12-05 MED ORDER — TETANUS-DIPHTH-ACELL PERTUSSIS 5-2.5-18.5 LF-MCG/0.5 IM SUSP
0.5000 mL | Freq: Once | INTRAMUSCULAR | Status: DC
  Filled 2016-12-05: qty 0.5

## 2016-12-05 MED ORDER — SIMETHICONE 80 MG PO CHEW
80.0000 mg | CHEWABLE_TABLET | ORAL | Status: DC | PRN
  Filled 2016-12-05: qty 1

## 2016-12-05 MED ORDER — PRENATAL MULTIVITAMIN CH
1.0000 | ORAL_TABLET | Freq: Every day | ORAL | Status: DC
  Administered 2016-12-06 – 2016-12-08 (×3): 1 via ORAL
  Filled 2016-12-05 (×5): qty 1

## 2016-12-05 MED ORDER — DEXAMETHASONE SODIUM PHOSPHATE 4 MG/ML IJ SOLN
INTRAMUSCULAR | Status: DC | PRN
  Administered 2016-12-05: 4 mg via INTRAVENOUS

## 2016-12-05 MED ORDER — SCOPOLAMINE 1 MG/3DAYS TD PT72
MEDICATED_PATCH | TRANSDERMAL | Status: AC
  Filled 2016-12-05: qty 1

## 2016-12-05 MED ORDER — FENTANYL CITRATE (PF) 100 MCG/2ML IJ SOLN
INTRAMUSCULAR | Status: DC | PRN
  Administered 2016-12-05: 25 ug via INTRATHECAL

## 2016-12-05 MED ORDER — PHENYLEPHRINE 8 MG IN D5W 100 ML (0.08MG/ML) PREMIX OPTIME
INJECTION | INTRAVENOUS | Status: AC
  Filled 2016-12-05: qty 100

## 2016-12-05 MED ORDER — MORPHINE SULFATE (PF) 0.5 MG/ML IJ SOLN
INTRAMUSCULAR | Status: AC
  Filled 2016-12-05: qty 10

## 2016-12-05 MED ORDER — OXYCODONE HCL 5 MG PO TABS
10.0000 mg | ORAL_TABLET | ORAL | Status: DC | PRN
  Administered 2016-12-06: 10 mg via ORAL
  Filled 2016-12-05 (×2): qty 2

## 2016-12-05 MED ORDER — SIMETHICONE 80 MG PO CHEW
80.0000 mg | CHEWABLE_TABLET | ORAL | Status: DC
  Administered 2016-12-05 – 2016-12-08 (×3): 80 mg via ORAL
  Filled 2016-12-05 (×5): qty 1

## 2016-12-05 MED ORDER — METOCLOPRAMIDE HCL 5 MG/ML IJ SOLN
INTRAMUSCULAR | Status: DC | PRN
  Administered 2016-12-05: 10 mg via INTRAVENOUS

## 2016-12-05 MED ORDER — CEFAZOLIN SODIUM-DEXTROSE 2-4 GM/100ML-% IV SOLN
2.0000 g | INTRAVENOUS | Status: AC
  Administered 2016-12-05: 2 g via INTRAVENOUS
  Filled 2016-12-05: qty 100

## 2016-12-05 MED ORDER — SIMETHICONE 80 MG PO CHEW
80.0000 mg | CHEWABLE_TABLET | Freq: Three times a day (TID) | ORAL | Status: DC
  Administered 2016-12-05 – 2016-12-08 (×7): 80 mg via ORAL
  Filled 2016-12-05 (×13): qty 1

## 2016-12-05 MED ORDER — WITCH HAZEL-GLYCERIN EX PADS: 1.0000 "application " | MEDICATED_PAD | CUTANEOUS | Status: DC | PRN

## 2016-12-05 MED ORDER — METOCLOPRAMIDE HCL 5 MG/ML IJ SOLN
INTRAMUSCULAR | Status: AC
  Filled 2016-12-05: qty 2

## 2016-12-05 MED ORDER — OXYCODONE HCL 5 MG PO TABS
5.0000 mg | ORAL_TABLET | ORAL | Status: DC | PRN
  Administered 2016-12-06 – 2016-12-08 (×7): 5 mg via ORAL
  Filled 2016-12-05 (×7): qty 1

## 2016-12-05 MED ORDER — LACTATED RINGERS IV SOLN
125.0000 mL/h | INTRAVENOUS | Status: DC
  Administered 2016-12-05: 12:00:00 via INTRAVENOUS
  Administered 2016-12-05: 125 mL/h via INTRAVENOUS

## 2016-12-05 MED ORDER — FENTANYL CITRATE (PF) 100 MCG/2ML IJ SOLN
INTRAMUSCULAR | Status: AC
  Filled 2016-12-05: qty 2

## 2016-12-05 MED ORDER — ACETAMINOPHEN 160 MG/5ML PO SOLN
975.0000 mg | Freq: Once | ORAL | Status: AC
  Administered 2016-12-05: 975 mg via ORAL
  Filled 2016-12-05: qty 40.6

## 2016-12-05 MED ORDER — ZOLPIDEM TARTRATE 5 MG PO TABS: 5.0000 mg | ORAL_TABLET | Freq: Every evening | ORAL | Status: DC | PRN

## 2016-12-05 MED ORDER — OXYTOCIN 10 UNIT/ML IJ SOLN
INTRAMUSCULAR | Status: AC
Start: 2016-12-05 — End: 2016-12-05
  Filled 2016-12-05: qty 4

## 2016-12-05 MED ORDER — MENTHOL 3 MG MT LOZG
1.0000 | LOZENGE | OROMUCOSAL | Status: DC | PRN
  Filled 2016-12-05: qty 9

## 2016-12-05 MED ORDER — LACTATED RINGERS IV SOLN
INTRAVENOUS | Status: DC
  Administered 2016-12-05: via INTRAVENOUS

## 2016-12-05 MED ORDER — BUPIVACAINE IN DEXTROSE 0.75-8.25 % IT SOLN
INTRATHECAL | Status: DC | PRN
  Administered 2016-12-05: 1.4 mL via INTRATHECAL

## 2016-12-05 MED ORDER — BUPIVACAINE IN DEXTROSE 0.75-8.25 % IT SOLN
INTRATHECAL | Status: AC
  Filled 2016-12-05: qty 2

## 2016-12-05 MED ORDER — LACTATED RINGERS IV SOLN
INTRAVENOUS | Status: DC | PRN
  Administered 2016-12-05: 12:00:00 via INTRAVENOUS

## 2016-12-05 MED ORDER — ACETAMINOPHEN 325 MG PO TABS
650.0000 mg | ORAL_TABLET | ORAL | Status: DC | PRN
  Administered 2016-12-05 – 2016-12-07 (×9): 650 mg via ORAL
  Filled 2016-12-05 (×10): qty 2

## 2016-12-05 MED ORDER — SCOPOLAMINE 1 MG/3DAYS TD PT72
MEDICATED_PATCH | TRANSDERMAL | Status: DC | PRN
  Administered 2016-12-05: 1 via TRANSDERMAL

## 2016-12-05 MED ORDER — SENNOSIDES-DOCUSATE SODIUM 8.6-50 MG PO TABS
2.0000 | ORAL_TABLET | ORAL | Status: DC
  Administered 2016-12-05 – 2016-12-08 (×3): 2 via ORAL
  Filled 2016-12-05 (×5): qty 2

## 2016-12-05 MED ORDER — IBUPROFEN 600 MG PO TABS
600.0000 mg | ORAL_TABLET | Freq: Four times a day (QID) | ORAL | Status: DC
  Administered 2016-12-05 – 2016-12-08 (×12): 600 mg via ORAL
  Filled 2016-12-05 (×11): qty 1

## 2016-12-05 MED ORDER — OXYTOCIN 40 UNITS IN LACTATED RINGERS INFUSION - SIMPLE MED
INTRAVENOUS | Status: DC | PRN
Start: 2016-12-05 — End: 2016-12-05
  Administered 2016-12-05: 40 mL via INTRAVENOUS

## 2016-12-05 MED ORDER — MORPHINE SULFATE (PF) 0.5 MG/ML IJ SOLN
INTRAMUSCULAR | Status: DC | PRN
  Administered 2016-12-05: .1 mg via INTRATHECAL

## 2016-12-05 MED ORDER — COCONUT OIL OIL
1.0000 "application " | TOPICAL_OIL | Status: DC | PRN
  Administered 2016-12-08: 1 via TOPICAL
  Filled 2016-12-05 (×2): qty 120

## 2016-12-05 MED ORDER — DIBUCAINE 1 % RE OINT
1.0000 "application " | TOPICAL_OINTMENT | RECTAL | Status: DC | PRN
  Filled 2016-12-05: qty 28

## 2016-12-05 MED ORDER — DEXAMETHASONE SODIUM PHOSPHATE 4 MG/ML IJ SOLN
INTRAMUSCULAR | Status: AC
  Filled 2016-12-05: qty 1

## 2016-12-05 SURGICAL SUPPLY — 34 items
BENZOIN TINCTURE PRP APPL 2/3 (GAUZE/BANDAGES/DRESSINGS) ×3 IMPLANT
CHLORAPREP W/TINT 26ML (MISCELLANEOUS) ×3 IMPLANT
CLAMP CORD UMBIL (MISCELLANEOUS) IMPLANT
CLOSURE WOUND 1/2 X4 (GAUZE/BANDAGES/DRESSINGS) ×1
CLOTH BEACON ORANGE TIMEOUT ST (SAFETY) ×3 IMPLANT
DRSG OPSITE POSTOP 4X10 (GAUZE/BANDAGES/DRESSINGS) ×3 IMPLANT
ELECT REM PT RETURN 9FT ADLT (ELECTROSURGICAL) ×3
ELECTRODE REM PT RTRN 9FT ADLT (ELECTROSURGICAL) ×1 IMPLANT
EXTRACTOR VACUUM M CUP 4 TUBE (SUCTIONS) IMPLANT
EXTRACTOR VACUUM M CUP 4' TUBE (SUCTIONS)
GLOVE BIOGEL PI IND STRL 7.0 (GLOVE) ×2 IMPLANT
GLOVE BIOGEL PI IND STRL 7.5 (GLOVE) ×3 IMPLANT
GLOVE BIOGEL PI INDICATOR 7.0 (GLOVE) ×4
GLOVE BIOGEL PI INDICATOR 7.5 (GLOVE) ×6
GLOVE ECLIPSE 7.5 STRL STRAW (GLOVE) ×6 IMPLANT
GOWN STRL REUS W/TWL LRG LVL3 (GOWN DISPOSABLE) ×9 IMPLANT
KIT ABG SYR 3ML LUER SLIP (SYRINGE) IMPLANT
NEEDLE HYPO 25X5/8 SAFETYGLIDE (NEEDLE) IMPLANT
NS IRRIG 1000ML POUR BTL (IV SOLUTION) ×3 IMPLANT
PACK C SECTION WH (CUSTOM PROCEDURE TRAY) ×3 IMPLANT
PAD ABD 7.5X8 STRL (GAUZE/BANDAGES/DRESSINGS) ×3 IMPLANT
PAD OB MATERNITY 4.3X12.25 (PERSONAL CARE ITEMS) ×3 IMPLANT
PENCIL SMOKE EVAC W/HOLSTER (ELECTROSURGICAL) ×3 IMPLANT
RTRCTR C-SECT PINK 25CM LRG (MISCELLANEOUS) ×3 IMPLANT
SPONGE GAUZE 4X4 12PLY STER LF (GAUZE/BANDAGES/DRESSINGS) ×6 IMPLANT
STRIP CLOSURE SKIN 1/2X4 (GAUZE/BANDAGES/DRESSINGS) ×2 IMPLANT
SUT VIC AB 0 CTX 36 (SUTURE) ×6
SUT VIC AB 0 CTX36XBRD ANBCTRL (SUTURE) ×3 IMPLANT
SUT VIC AB 2-0 CT1 27 (SUTURE) ×2
SUT VIC AB 2-0 CT1 TAPERPNT 27 (SUTURE) ×1 IMPLANT
SUT VIC AB 4-0 KS 27 (SUTURE) ×3 IMPLANT
TAPE CLOTH SURG 4X10 WHT LF (GAUZE/BANDAGES/DRESSINGS) ×3 IMPLANT
TOWEL OR 17X24 6PK STRL BLUE (TOWEL DISPOSABLE) ×3 IMPLANT
TRAY FOLEY BAG SILVER LF 14FR (SET/KITS/TRAYS/PACK) ×3 IMPLANT

## 2016-12-05 NOTE — H&P (Signed)
LABOR AND DELIVERY ADMISSION HISTORY AND PHYSICAL NOTE  [name] Young is a 26 y.o. female [phone] with IUP at 6675w1d by lmp presenting for scheduled repeat cesarean section.   She reports positive fetal movement. She denies leakage of fluid or vaginal bleeding.  Prenatal History/Complications:  Past Medical History: Past Medical History:  Diagnosis Date  . Depression   . Hx MRSA infection   . Medical history non-contributory   . Pregnancy induced hypertension     Past Surgical History: Past Surgical History:  Procedure Laterality Date  . CESAREAN SECTION N/A 02/17/2014   Procedure: CESAREAN SECTION;  Surgeon: Tereso NewcomerUgonna A Anyanwu, MD;  Location: [facility];  Service: Obstetrics;  Laterality: N/A;  . CESAREAN SECTION N/A 01/08/2015   Procedure: CESAREAN SECTION;  Surgeon: Tereso NewcomerUgonna A Anyanwu, MD;  Location: [facility];  Service: Obstetrics;  Laterality: N/A;    Obstetrical History: OB History    Gravida Para Term Preterm AB Living   3 2 1 1  0 2   SAB TAB Ectopic Multiple Live Births   0 0 0 0 2      Social History: Social History   Social History  . Marital status: Married    Spouse name: N/A  . Number of children: N/A  . Years of education: N/A   Social History Main Topics  . Smoking status: Never Smoker  . Smokeless tobacco: Never Used  . Alcohol use No  . Drug use: No  . Sexual activity: Yes    Birth control/ protection: None   Other Topics Concern  . None   Social History Narrative  . None    Family History: Family History  Problem Relation Age of Onset  . Hypertension Mother   . Hypertension Father     Allergies: No Known Allergies  Prescriptions Prior to Admission  Medication Sig Dispense Refill Last Dose  . Prenat w/o A Vit-FeFum-FePo-FA (CONCEPT OB) 130-92.4-1 MG CAPS Take 1 capsule by mouth daily. 30 capsule 11 Taking  . aspirin EC 81 MG tablet Take 1 tablet (81 mg total) by mouth daily. (Patient not taking: Reported on 11/08/2016) 30 tablet 6 Not Taking  .  Vitamin D, Ergocalciferol, (DRISDOL) 50000 units CAPS capsule Take 1 capsule (50,000 Units total) by mouth every 7 (seven) days. (Patient not taking: Reported on 12/01/2016) 30 capsule 2 Not Taking at Unknown time     Review of Systems   All systems reviewed and negative except as stated in HPI  Blood pressure 124/79, pulse 72, temperature 98.5 F (36.9 C), temperature source Oral, resp. rate 18, height 5\' 3"  (1.6 m), weight 161 lb (73 kg), last menstrual period 02/05/2016, unknown if currently breastfeeding. General appearance: alert, cooperative and appears stated age Lungs: clear to auscultation bilaterally Heart: regular rate and rhythm Abdomen: soft, non-tender; bowel sounds normal Extremities: No calf swelling or tenderness      Prenatal labs: ABO, Rh: --/--/B NEG (08/06 0955) Antibody: NEG (08/06 0955) Rubella: neg RPR: Non Reactive (08/06 0955)  HBsAg: Negative (03/29 1344)  HIV:   neg GBS: Negative (07/18 0906)  1 hr Glucola: wnl Genetic screening:  wnl Anatomy US: wnl  Prenatal Transfer Tool  Maternal Diabetes: No Genetic Screening: Normal Maternal Ultrasounds/Referrals: Normal Fetal Ultrasounds or other Referrals:  None Maternal Substance Abuse:  No Significant Maternal Medications:  None Significant Maternal Lab Results: Lab values include: Rh negative  No results found for this or any previous visit (from the past 24 hour(s)).  Patient Active Problem List  Diagnosis Date Noted  . Supervision of normal pregnancy, antepartum 07/27/2016  . Previous cesarean delivery affecting pregnancy, antepartum 12/23/2014  . History of preterm delivery, currently pregnant 12/23/2014  . Hx of preeclampsia, prior pregnancy, currently pregnant 02/15/2014  . Rh negative status during pregnancy 08/24/2013  . Vitamin D deficiency 08/24/2013    Assessment: [name] Young is a 26 y.o. [id number] at [redacted]w[redacted]d here for scheduled elective repeat c/s. Hx c/s x2.  The risks of cesarean  section were discussed with the patient including but were not limited to: bleeding which may require transfusion or reoperation; infection which may require antibiotics; injury to bowel, bladder, ureters or other surrounding organs; injury to the fetus; need for additional procedures including hysterectomy in the event of a life-threatening hemorrhage; placental abnormalities wth subsequent pregnancies, incisional problems, thromboembolic phenomenon and other postoperative/anesthesia complications.  The patient concurred with the proposed plan, giving informed written consent for the procedures. Anesthesia and OR aware.  Preoperative prophylactic antibiotics and SCDs ordered on call to the OR.  To OR when ready.   #ID:  gbs neg #MOF: breast/bottle #MOC: nexplanon @ 6 wk PP visit #Circ:  N/a #Rh neg: w/u PP  Melissa Pulido B Donald Jacque 12/05/2016, 11:00 AM

## 2016-12-05 NOTE — Transfer of Care (Signed)
Immediate Anesthesia Transfer of Care Note  Patient: [name] Young  Procedure(s) Performed: Procedure(s): CESAREAN SECTION (N/A)  Patient Location: PACU  Anesthesia Type:Spinal  Level of Consciousness: awake, alert  and oriented  Airway & Oxygen Therapy: Patient Spontanous Breathing  Post-op Assessment: Report given to RN and Post -op Vital signs reviewed and stable  Post vital signs: Reviewed and stable  Last Vitals:  Vitals:   12/05/16 1018  BP: 124/79  Pulse: 72  Resp: 18  Temp: 36.9 C    Last Pain:  Vitals:   12/05/16 1018  TempSrc: Oral         Complications: No apparent anesthesia complications

## 2016-12-05 NOTE — Anesthesia Postprocedure Evaluation (Signed)
Anesthesia Post Note  Patient: [name] Young  Procedure(s) Performed: Procedure(s) (LRB): CESAREAN SECTION (N/A)     Patient location during evaluation: PACU Anesthesia Type: Spinal Level of consciousness: oriented and awake and alert Pain management: pain level controlled Vital Signs Assessment: post-procedure vital signs reviewed and stable Respiratory status: spontaneous breathing and respiratory function stable Cardiovascular status: blood pressure returned to baseline and stable Postop Assessment: no headache and no backache Anesthetic complications: no    Last Vitals:  Vitals:   12/05/16 1625 12/05/16 1800  BP: 118/67   Pulse: (!) 58   Resp: 16 18  Temp: 36.7 C 36.6 C    Last Pain:  Vitals:   12/05/16 1800  TempSrc: Axillary  PainSc: 4    Pain Goal: Patients Stated Pain Goal: 4 (12/05/16 1800)               [name]

## 2016-12-05 NOTE — Op Note (Signed)
Cesarean Section Operative Report  [name] Young  12/05/2016  Indications: Scheduled Proceedure/Maternal Request   Pre-operative Diagnosis: repeat c/s.   Post-operative Diagnosis: Same   Surgeon: Surgeon(s) and Role:    * Levie HeritageStinson, Jacob J, DO - Primary    * Anglia Blakley, Wilfred CurtisNoah Bedford, MD - Assisting   Anesthesia: spinal    Estimated Blood Loss: 428 ml  Total IV Fluids: 2300 ml LR  Urine Output:: 75 ml clear yellow urine  Specimens: none  Findings: Viable female infant in cephalic presentation; Apgars pending; weight pending g; arterial cord pH not obtained; clear amniotic fluid; intact placenta with three vessel cord; normal uterus, fallopian tubes and ovaries bilaterally. No significant adhesive disease.  Baby condition / location:  Couplet care / Skin to Skin   Complications: no complications  Indications: [name] Young is a 26 y.o. [id number] with an IUP 6427w1d presenting for scheduled repeat cesarean section..  The risks, benefits, complications, treatment options, and exected outcomes were discussed with the patient . The patient dwith the proposed plan, giving informed consent. identified as [name] Young and the procedure verified as C-Section Delivery.  Procedure Details:  The patient was taken back to the operative suite where spinal anesthesia was placed.  A time out was held and the above information confirmed.   After induction of anesthesia, the patient was draped and prepped in the usual sterile manner and placed in a dorsal supine position with a leftward tilt. A Pfannenstiel incision was made and carried down through the subcutaneous tissue to the fascia. Fascial incision was made and sharply extended transversely. The fascia was separated from the underlying rectus tissue superiorly and inferiorly. The peritoneum was identified and sharply entered with curved mayos and extended longitudinally. Alexis retractor was placed. A low transverse uterine incision was made and extended  bluntly. Amniotic fluid was collected for donation. Delivered from cephalic presentation was a viable infant with Apgars and weight as above.  After waiting 60 seconds for delayed cord cutting, the umbilical cord was clamped and cut cord blood was obtained for evaluation. Cord ph was not sent. The placenta was removed Intact and appeared normal. The uterine outline, tubes and ovaries appeared normal. The uterine incision was closed with running locked sutures of 2-0Vicryl with an imbricating layer of the same.   Hemostasis was observed. The peritoneum was closed with 2-0 vicryl. The rectus muscles were examined and hemostasis observed. The fascia was then reapproximated with running sutures of 0Vicryl. The skin was closed with 4-0Vicryl.   Instrument, sponge, and needle counts were correct prior the abdominal closure and were correct at the conclusion of the case.     Disposition: PACU - hemodynamically stable.   Maternal Condition: stable       Signed: Lavonne Chickoah B WoukMD 12/05/2016 12:38 PM

## 2016-12-05 NOTE — Anesthesia Preprocedure Evaluation (Signed)
Anesthesia Evaluation  Patient identified by MRN, date of birth, ID band Patient awake    Reviewed: Allergy & Precautions, H&P , Patient's Chart, lab work & pertinent test results  Airway Mallampati: II  TM Distance: >3 FB Neck ROM: full    Dental no notable dental hx.    Pulmonary    Pulmonary exam normal breath sounds clear to auscultation       Cardiovascular Exercise Tolerance: Good hypertension,  Rhythm:regular Rate:Normal     Neuro/Psych    GI/Hepatic   Endo/Other    Renal/GU      Musculoskeletal   Abdominal   Peds  Hematology   Anesthesia Other Findings   Reproductive/Obstetrics                             Anesthesia Physical Anesthesia Plan  ASA: II  Anesthesia Plan: Spinal   Post-op Pain Management:    Induction:   PONV Risk Score and Plan:   Airway Management Planned:   Additional Equipment:   Intra-op Plan:   Post-operative Plan:   Informed Consent: I have reviewed the patients History and Physical, chart, labs and discussed the procedure including the risks, benefits and alternatives for the proposed anesthesia with the patient or authorized representative who has indicated his/her understanding and acceptance.     Plan Discussed with:   Anesthesia Plan Comments: (  )        Anesthesia Quick Evaluation  

## 2016-12-05 NOTE — Plan of Care (Signed)
Problem: Physical Regulation: Goal: Ability to maintain clinical measurements within normal limits will improve Outcome: Progressing Patient currently rating pain under acceptable level  Problem: Education: Goal: Knowledge of condition will improve Outcome: Completed/Met Date Met: 12/05/16 Repeat cesarean section  Problem: Coping: Goal: Ability to cope will improve Outcome: Completed/Met Date Met: 12/05/16 3rd baby Goal: Ability to identify and utilize available resources and services will improve Outcome: Completed/Met Date Met: 12/05/16 3rd baby  Problem: Nutritional: Goal: Mothers verbalization of comfort with breastfeeding process will improve Outcome: Progressing Did not breast feed previous 2 babies  Problem: Role Relationship: Goal: Ability to demonstrate positive interaction with newborn will improve Outcome: Progressing Baby needs to be in nursery and patient encouraged to rest

## 2016-12-06 LAB — CBC
HEMATOCRIT: 28.3 % — AB (ref 36.0–46.0)
HEMOGLOBIN: 9.3 g/dL — AB (ref 12.0–15.0)
MCH: 26.4 pg (ref 26.0–34.0)
MCHC: 32.9 g/dL (ref 30.0–36.0)
MCV: 80.4 fL (ref 78.0–100.0)
Platelets: 273 10*3/uL (ref 150–400)
RBC: 3.52 MIL/uL — AB (ref 3.87–5.11)
RDW: 14.6 % (ref 11.5–15.5)
WBC: 11.7 10*3/uL — ABNORMAL HIGH (ref 4.0–10.5)

## 2016-12-06 LAB — BIRTH TISSUE RECOVERY COLLECTION (PLACENTA DONATION)

## 2016-12-06 NOTE — Lactation Note (Signed)
This note was copied from a baby's chart. Lactation Consultation Note  Patient Name: [name] Young Date: 12/06/2016 Reason for consult: Follow-up assessment  Mom requesting assistance with positioning and latching baby. Baby jittery, and rigid.  Baby STS in laid back position.  Mom with large breasts, and large diameter nipples.  Baby 5 lbs 10.3 oz and has a small, tight mouth.  Baby also sucking tongue, so demonstrated suck training.  Baby just able to latch onto nipple only initially.  No swallowing heard.  Baby fed for 10 mins in laid back position.  Basic teaching done. After diaper changed, baby started rooting again.  Taught Mom how to use cross cradle hold.  With nipple/areola sandwiching, baby opened wide and latched onto areola.  After 5-10 mins, baby relaxed more and mouth was able to be adjusted by tugging on chin.  Mom denying any discomfort.  A few swallows identified with Mom.  Mom taught how to use alternate breast compression to increase milk transfer. Pump set up and encouraged Mom to pump after baby breast feeds, to support her milk supply.  If baby acts contented after breastfeeding with a wide, deep latch, recommended she place baby STS.  If baby still hungry after breastfeeding, to supplement with cup or pace bottle feeding.  To ask for assistance with cup, and Mom taught how to pace bottle feed. To ask for assistance as needed.  Consult Status Consult Status: Follow-up Date: 12/07/16 Follow-up type: In-patient    Judee ClaraSmith, Hussain Maimone E 12/06/2016, 1:37 PM

## 2016-12-06 NOTE — Anesthesia Postprocedure Evaluation (Signed)
Anesthesia Post Note  Patient: [name] Young  Procedure(s) Performed: Procedure(s) (LRB): CESAREAN SECTION (N/A)     Patient location during evaluation: Mother Baby Anesthesia Type: Spinal Level of consciousness: awake and alert and oriented Pain management: pain level controlled Vital Signs Assessment: post-procedure vital signs reviewed and stable Respiratory status: spontaneous breathing and nonlabored ventilation Cardiovascular status: stable Postop Assessment: no headache, patient able to bend at knees, no backache, no signs of nausea or vomiting, adequate PO intake and spinal receding Anesthetic complications: no    Last Vitals:  Vitals:   12/06/16 0130 12/06/16 0526  BP: 119/65 124/74  Pulse: (!) 57 (!) 58  Resp: 18 18  Temp: 36.8 C 36.8 C    Last Pain:  Vitals:   12/06/16 0740  TempSrc:   PainSc: 6    Pain Goal: Patients Stated Pain Goal: 3 (12/06/16 0740)               Laban EmperorMalinova,Adelai Achey Hristova

## 2016-12-06 NOTE — Lactation Note (Signed)
This note was copied from a baby's chart. Lactation Consultation Note  Patient Name: Girl Allie DimmerDymond Gwinner VHQIO'NToday's Date: 12/06/2016 Reason for consult: Initial assessment;1st time breastfeeding;Infant < 6lbs Breastfeeding consultation services and support information given to patient.  Mom states this is her first time breastfeeding.  She states baby latched and fed well for one hour in PACU.  Baby has not latched since or showed interest.  Mom started giving formula during the night and baby has been bottle fed twice.  Baby just finished a formula feeding.  Mom has pumped with DEBP once.  Instructed to call LC for assist when baby starts to cue.  Recommended she use the pump every 3 hours.  Maternal Data Has patient been taught Hand Expression?: Yes Does the patient have breastfeeding experience prior to this delivery?: No  Feeding    LATCH Score                   Interventions    Lactation Tools Discussed/Used     Consult Status Consult Status: Follow-up Date: 12/06/16 Follow-up type: In-patient    Huston FoleyMOULDEN, Thierno Hun S 12/06/2016, 10:29 AM

## 2016-12-06 NOTE — Progress Notes (Signed)
Subjective: Postpartum Day 1: Cesarean Delivery Patient reports incisional pain, tolerating PO, + flatus and no problems voiding.  The pain after ibuprofen and tylenol is about 5 with minimal to no movement. She would like something for breakthrough pain.   Objective: Vital signs in last 24 hours: Temp:  [97.6 F (36.4 C)-98.5 F (36.9 C)] 98.3 F (36.8 C) (08/08 0526) Pulse Rate:  [53-72] 58 (08/08 0526) Resp:  [0-22] 18 (08/08 0526) BP: (112-125)/(50-79) 124/74 (08/08 0526) SpO2:  [99 %-100 %] 99 % (08/08 0526) Weight:  [73 kg (161 lb)] 73 kg (161 lb) (08/07 0955)  Physical Exam:  General: alert, cooperative and no distress Lochia: appropriate Uterine Fundus: firm Incision: no significant drainage, drainage seen through gauze will likely need changing today.  DVT Evaluation: No evidence of DVT seen on physical exam.   Recent Labs  12/04/16 0955  HGB 10.3*  HCT 32.3*    Assessment/Plan: Status post Cesarean section. Doing well postoperatively. Will had oxycodone or additional pain medication for management of pain.  Continue current care.  Ignacia MarvelKendrick C White 12/06/2016, 7:23 AM  CNM attestation Post Partum Day #1 I have seen and examined this patient and agree with above documentation in the resident's note.   Allie DimmerDymond Tijerina is a 26 y.o. [id number] s/p rLTCS.  Pt denies problems with ambulating, voiding or po intake. Pain is well controlled.  Plan for birth control is Nexplanon.  Method of Feeding: both  Denies dizziness with amb; breastfeeding going slowly.  PE:  BP 124/74 (BP Location: Left Arm)   Pulse (!) 58   Temp 98.3 F (36.8 C) (Oral)   Resp 18   Ht 5\' 3"  (1.6 m)   Wt 73 kg (161 lb)   LMP 02/05/2016   SpO2 99%   Breastfeeding? Unknown   BMI 28.52 kg/m  Fundus firm  Plan for discharge: 12/07/16 Pt declined earlier CBC- will collect it later this morning  Cam HaiSHAW, Tigerlily Christine, CNM 8:17 AM 12/06/2016

## 2016-12-07 MED ORDER — OXYCODONE HCL 5 MG PO TABS: 5.0000 mg | ORAL_TABLET | ORAL | 0 refills | Status: DC | PRN

## 2016-12-07 NOTE — Lactation Note (Signed)
This note was copied from a baby'Young chart. Lactation Consultation Note  Patient Name: [name] Young Reason for consult: Follow-up assessment;Difficult latch;Infant < 6lbs Mom states she is having some success latching baby to left breast.  Mom is pumping and hand expressing every 2-3 hours.  Observed pumping and flange size increased to a 30 mm for a better fit.  .5 mls expressed and dropper fed to baby.  Assisted with latching baby to right breast using cross cradle hold.  Mom has large nipples and baby with small mouth which makes a deep latch difficult.  Baby very sleepy at breast.  Shallow latch was achieved for a few minutes only then baby fell asleep.  Reassured mom and instructed to continue to latch with cues, post pump and supplement with expressed milk/formula per guidelines.  Maternal Data    Feeding Feeding Type: Breast Fed Nipple Type: Slow - flow  LATCH Score Latch: Repeated attempts needed to sustain latch, nipple held in mouth throughout feeding, stimulation needed to elicit sucking reflex.  Audible Swallowing: None  Type of Nipple: Everted at rest and after stimulation  Comfort (Breast/Nipple): Soft / non-tender  Hold (Positioning): Assistance needed to correctly position infant at breast and maintain latch.  LATCH Score: 6  Interventions    Lactation Tools Discussed/Used     Consult Status Consult Status: Follow-up Date: 12/08/16 Follow-up type: In-patient    Huston FoleyMOULDEN, [name] Young Young, 9:42 AM

## 2016-12-07 NOTE — Progress Notes (Signed)
Subjective: Postpartum Day 2: Cesarean Delivery Patient reports incisional pain, tolerating PO, + flatus, + BM and no problems voiding.  Blood pressures well controlled. Abdominal pain is still constant, with cramping.    Objective: Vital signs in last 24 hours: Temp:  [98.3 F (36.8 C)-98.4 F (36.9 C)] 98.3 F (36.8 C) (08/09 0630) Pulse Rate:  [54-59] 59 (08/09 0630) Resp:  [18-20] 18 (08/09 0630) BP: (122-123)/(63-72) 122/72 (08/09 0630) SpO2:  [100 %] 100 % (08/09 0630)  Physical Exam:  General: alert, cooperative and mild distress Lochia: appropriate Uterine Fundus: firm, very tender over mid abdomen.  Abdomen: Generalized tendered  Incision: healing well, no significant drainage DVT Evaluation: No evidence of DVT seen on physical exam.   Recent Labs  12/04/16 0955 12/06/16 0805  HGB 10.3* 9.3*  HCT 32.3* 28.3*    Assessment/Plan: Status post Cesarean section. Doing well postoperatively.  Continue current care. Pending patient comfort she may be discharged today.   Ignacia MarvelKendrick C Yachet Mattson 12/07/2016, 7:49 AM

## 2016-12-07 NOTE — Discharge Summary (Signed)
OB Discharge Summary     Patient Name: [name] Young DOB: [birth year] MRN: [id number]  Date of admission: 12/05/2016 Delivering MD: Levie HeritageSTINSON, JACOB J   Date of discharge: 12/07/2016  Admitting diagnosis: RCS Intrauterine pregnancy: 5760w1d     Secondary diagnosis:  Active Problems:   Status post repeat low transverse cesarean section       Discharge diagnosis: Term Pregnancy Delivered and Preeclampsia (mild)                                                                                                Post partum procedures:none  Augmentation: n/a  Complications: None  Hospital course:  Sceduled C/S   26 y.o. yo G3P2103 at 6460w1d was admitted to the hospital 12/05/2016 for scheduled cesarean section with the following indication:Pre-eclampsia.  Membrane Rupture Time/Date: 12:04 PM ,12/05/2016   Patient delivered a Viable infant.12/05/2016  Details of operation can be found in separate operative note.  Pateint had an uncomplicated postpartum course.  She is ambulating, tolerating a regular diet, passing flatus, and urinating well. Patient is discharged home in stable condition on  12/07/16.          Physical exam  Vitals:   12/06/16 0526 12/06/16 1045 12/06/16 1803 12/07/16 0630  BP: 124/74 123/64 122/63 122/72  Pulse: (!) 58 (!) 56 (!) 54 (!) 59  Resp: 18 20 20 18   Temp: 98.3 F (36.8 C) 98.4 F (36.9 C) 98.4 F (36.9 C) 98.3 F (36.8 C)  TempSrc: Oral  Oral Oral  SpO2: 99%   100%  Weight:      Height:       General: alert, cooperative and no distress Lochia: appropriate Uterine Fundus: firm Incision: Healing well with no significant drainage DVT Evaluation: No evidence of DVT seen on physical exam. Labs: Lab Results  Component Value Date   WBC 11.7 (H) 12/06/2016   HGB 9.3 (L) 12/06/2016   HCT 28.3 (L) 12/06/2016   MCV 80.4 12/06/2016   PLT 273 12/06/2016   CMP Latest Ref Rng & Units 08/24/2014  Glucose 70 - 99 mg/dL 82(N67(L)  BUN 6 - 23 mg/dL 11  Creatinine 5.620.50 -  1.10 mg/dL 1.300.67  Sodium 865135 - 784145 mmol/L 136  Potassium 3.5 - 5.1 mmol/L 3.9  Chloride 96 - 112 mmol/L 105  CO2 19 - 32 mmol/L 23  Calcium 8.4 - 10.5 mg/dL 9.3  Total Protein 6.0 - 8.3 g/dL 7.2  Total Bilirubin 0.3 - 1.2 mg/dL 0.4  Alkaline Phos 39 - 117 U/L 52  AST 0 - 37 U/L 15  ALT 0 - 35 U/L 9    Discharge instruction: per After Visit Summary and "Baby and Me Booklet".  After visit meds:  Allergies as of 12/07/2016   No Known Allergies     Medication List    STOP taking these medications   aspirin EC 81 MG tablet   CONCEPT OB 130-92.4-1 MG Caps   Vitamin D (Ergocalciferol) 50000 units Caps capsule Commonly known as:  DRISDOL     TAKE these medications   oxyCODONE 5 MG immediate release tablet Commonly  known as:  Oxy IR/ROXICODONE Take 1 tablet (5 mg total) by mouth every 4 (four) hours as needed (pain scale 4-7).       Diet: routine diet  Activity: Advance as tolerated. Pelvic rest for 6 weeks.   Outpatient follow up:6 weeks Follow up Appt:No future appointments. Follow up Visit:No Follow-up on file.  Postpartum contraception: Nexplanon  Newborn Data: Live born female  Birth Weight: 5 lb 15.4 oz (2705 g) APGAR: 8, 8  Baby Feeding: Bottle and Breast Disposition:home with mother   12/07/2016 Ignacia Marvel, MD  OB FELLOW DISCHARGE ATTESTATION  I have seen and examined this patient and agree with above documentation in the resident's note.   Patient decided to stay extra day  Frederik Pear, MD [facility] Fellow

## 2016-12-07 NOTE — Progress Notes (Signed)
CSW received consult for hx of Anxiety and Depression.  CSW met with [name] to offer support and complete assessment.   CSW recalls meeting [name] 2 years ago when she made an adoption plan for her second child.  [name] was extremely pleasant and welcoming of CSW's visit and receptive to talking about her feelings.  She spoke openly about her experience of having a baby in the NICU 3 years ago and making an adoption plan 2 years ago.  She reports that it took "a while" for her to be at peace with her decision, but now sees it as a beautiful decision, and "the right one" for her baby and her family.  She reports continued contact with "Peyton's" adoptive family and states they are coming today to visit her.  She is very happy about this.  She reports her oldest daughter is currently in [place] with PGM for two weeks to give her time to bond with baby and heal from her c-section.  [name] describes her relationship with her husband (father of all of her children, married for 1 year) as supportive.  She states "we are used to each other.  We haven't had any big obstacles."  She states, "we balance each other out."  She reports that he is a Journalist, newspaper and she is a Chief Operating Officer at [employer].  She plans to return to work after 6 weeks and states they work opposite shifts so do not need to put their children in daycare.  [name] states she has a good support system through her parents, best friend and his mother.  She states they "try not to need them."  She states they have most supplies ready for baby at home and that FOB is getting the car seat today.  CSW inquired as to where the baby will sleep and reviewed SIDS precautions, to which [name] was aware.  [name] states baby will sleep in a bassinet and that she thinks FOB will be obtaining that today as well.  CSW informed her of the availability of a baby box from the hospital in the event that he has not gotten the bassinet prior to discharge.  [name] stated understanding and will let CSW or her RN  know if she is in need of a baby box before going home.  [name] was appreciative.   CSW discussed [name]'s emotional health both now and after her first two deliveries, acknowledging that they were both high stress given NICU and adoption plan.  [name] began to cry and stated that her emotions are "all over the place."  She states that although she feels her husband is involved and supportive, "he doesn't understand the emotional piece."  CSW normalized and validated her emotions and spoke at length regarding the baby blues period vs PMADs.  [name] states she has been prescribed Zoloft in the past, but didn't like the way it made her feel like "a zombie."  CSW recommends counseling, which [name] is willing to contemplate.  She states she "bottles things up, and the littlest thing sets me off."  She acknowledged that this isn't working for her and agrees that mental health follow up could be beneficial.  CSW provided [name] with information for follow up in the walk in clinics at either [facility] or [facility], as both agencies provide counseling and medication management.   CSW also gave [name] a New Mom Checklist from Postpartum progress as a way to self-evaluate in addition to the [place] screen.  [name]  scored a 13 on the [place] in the hospital and understands the importance of monitoring her emotions and asking for help if negative emotions outweigh positive or interfere with daily life at any time.  [name] agreed and stated understanding.  CSW informed [name] of the support groups held at [facility] and encouraged her to get involved. CSW identifies no further need for intervention and no barriers to discharge at this time.  [name] thanked CSW for the visit and CSW thanked [name] for opening up to [place].

## 2016-12-08 MED ORDER — IBUPROFEN 600 MG PO TABS: 600.0000 mg | ORAL_TABLET | Freq: Four times a day (QID) | ORAL | 0 refills | Status: DC | PRN

## 2016-12-08 MED ORDER — OXYCODONE HCL 5 MG PO TABS: 5.0000 mg | ORAL_TABLET | ORAL | 0 refills | Status: DC | PRN

## 2016-12-08 NOTE — Discharge Summary (Signed)
OB Discharge Summary     Patient Name: [name] Young DOB: 29-Dec-1990 MRN: [id number]  Date of admission: 12/05/2016 Delivering MD: Levie HeritageSTINSON, JACOB J   Date of discharge: 12/08/2016  Admitting diagnosis: RCS Intrauterine pregnancy: 7052w1d     Secondary diagnosis:  Active Problems:   Status post repeat low transverse cesarean section       Discharge diagnosis: Term Pregnancy Delivered                                                                                                Post partum procedures: mom & baby Rh neg  Augmentation: n/a  Complications: None  Hospital course:  Sceduled C/S   26 y.o. yo G3P2103 at 5652w1d was admitted to the hospital 12/05/2016 for scheduled cesarean section with the following indication:Elective repeat Membrane Rupture Time/Date: 12:04 PM ,12/05/2016   Patient delivered a Viable infant.12/05/2016  Details of operation can be found in separate operative note.  Pateint had an uncomplicated postpartum course.  She is ambulating, tolerating a regular diet, passing flatus, and urinating well. Patient is discharged home in stable condition on  12/08/16.          Physical exam  Vitals:   12/06/16 1045 12/06/16 1803 12/07/16 0630 12/08/16 0615  BP: 123/64 122/63 122/72 129/80  Pulse: (!) 56 (!) 54 (!) 59 68  Resp: 20 20 18 18   Temp: 98.4 F (36.9 C) 98.4 F (36.9 C) 98.3 F (36.8 C) 98.4 F (36.9 C)  TempSrc:  Oral Oral Oral  SpO2:   100%   Weight:      Height:       General: alert, cooperative and no distress Lochia: appropriate Uterine Fundus: firm Incision: Healing well with no significant drainage DVT Evaluation: No evidence of DVT seen on physical exam. Labs: Lab Results  Component Value Date   WBC 11.7 (H) 12/06/2016   HGB 9.3 (L) 12/06/2016   HCT 28.3 (L) 12/06/2016   MCV 80.4 12/06/2016   PLT 273 12/06/2016   CMP Latest Ref Rng & Units 08/24/2014  Glucose 70 - 99 mg/dL 40(J67(L)  BUN 6 - 23 mg/dL 11  Creatinine 8.110.50 - 9.141.10 mg/dL 7.820.67   Sodium 956135 - 213145 mmol/L 136  Potassium 3.5 - 5.1 mmol/L 3.9  Chloride 96 - 112 mmol/L 105  CO2 19 - 32 mmol/L 23  Calcium 8.4 - 10.5 mg/dL 9.3  Total Protein 6.0 - 8.3 g/dL 7.2  Total Bilirubin 0.3 - 1.2 mg/dL 0.4  Alkaline Phos 39 - 117 U/L 52  AST 0 - 37 U/L 15  ALT 0 - 35 U/L 9    Discharge instruction: per After Visit Summary and "Baby and Me Booklet".  After visit meds:  Allergies as of 12/08/2016   No Known Allergies     Medication List    STOP taking these medications   aspirin EC 81 MG tablet   CONCEPT OB 130-92.4-1 MG Caps   Vitamin D (Ergocalciferol) 50000 units Caps capsule Commonly known as:  DRISDOL     TAKE these medications   ibuprofen 600 MG tablet Commonly known  as:  ADVIL,MOTRIN Take 1 tablet (600 mg total) by mouth every 6 (six) hours as needed.   oxyCODONE 5 MG immediate release tablet Commonly known as:  Oxy IR/ROXICODONE Take 1 tablet (5 mg total) by mouth every 4 (four) hours as needed (pain scale 4-7).       Diet: routine diet  Activity: Advance as tolerated. Pelvic rest for 6 weeks.   Outpatient follow up:4 weeks Follow up Appt:No future appointments. Follow up Visit:No Follow-up on file.  Postpartum contraception: Nexplanon  Newborn Data: Live born female  Birth Weight: 5 lb 15.4 oz (2705 g) APGAR: 8, 8  Baby Feeding: Bottle and Breast Disposition:home with mother   12/08/2016 John Giovanni, MD  CNM attestation I have seen and examined this patient and agree with above documentation in the resident's note.   [name] Young is a 26 y.o. W2N5621 s/p rLTCS.   Pain is well controlled.  Plan for birth control is Nexplanon.  Method of Feeding: both  PE:  BP 129/80 (BP Location: Right Arm)   Pulse 68   Temp 98.4 F (36.9 C) (Oral)   Resp 18   Ht 5\' 3"  (1.6 m)   Wt 73 kg (161 lb)   LMP 02/05/2016   SpO2 100%   Breastfeeding? Unknown   BMI 28.52 kg/m  Fundus firm   Recent Labs  12/06/16 0805  HGB 9.3*  HCT 28.3*      Plan: discharge today - postpartum care discussed - f/u clinic in 4 weeks for postpartum visit   Cam Hai, CNM 12:39 PM 12/08/2016

## 2016-12-08 NOTE — Lactation Note (Signed)
This note was copied from a baby's chart. Lactation Consultation Note:  Infant is 8069 hours old and will be a baby patient today. Mother is breastfeeding infant and then supplementing infant with EBM. Mother just pumped 28 ml and has in a bottle ready to give to infant. Mother has been pumping every 2-3 hours. She is active with WIC . [facility]. Mother was given a harmony hand pump with a #30 flange. Mother given instructions on use of hand pump. Advised mother to rouse infant well for feedings. Mother describes that infant doesn't open her mouth wide enough and she is getting sore. Advised mother to hand express milk and apply to nipples. Staff nurse gave mother coconut oil . Mother advised to page for next feeding for latch assist. Advised mother to use football or cross cradle and sit up in chair for better support. Mother advised to do good breast massage and to avoid engorgement. Mother teary and has concerns about infant. Support and encouragement given to mother. Mother receptive to all teaching. She is aware of available LC services and community support.   Patient Name: Girl Allie DimmerDymond Impson ZOXWR'UToday's Date: 12/08/2016 Reason for consult: Follow-up assessment   Maternal Data    Feeding Feeding Type: Breast Milk  LATCH Score                   Interventions    Lactation Tools Discussed/Used     Consult Status Consult Status: Follow-up Date: 12/09/16 Follow-up type: In-patient    Stevan BornKendrick, [facility] 12/08/2016, 9:47 AM

## 2016-12-08 NOTE — Discharge Instructions (Signed)

## 2016-12-09 ENCOUNTER — Ambulatory Visit: Payer: Self-pay

## 2016-12-09 NOTE — Lactation Note (Signed)
This note was copied from a baby's chart. Lactation Consultation Note  Patient Name: Girl Allie DimmerDymond Senseney AVWUJ'WToday's Date: 12/09/2016   Mother pumping in addition to breastfeeding and supplementing w/ formula. [facility] and [facility].  [facility]. LC [facility] and mother stated she would wait. Suggest to RN to give mother extra manual pump. Mom encouraged to feed baby 8-12 times/24 hours and with feeding cues.       Maternal Data    Feeding Nipple Type: Slow - flow  LATCH Score                   Interventions    Lactation Tools Discussed/Used     Consult Status      Hardie PulleyBerkelhammer, Kathrynne Kulinski Boschen 12/09/2016, 12:32 PM

## 2016-12-09 NOTE — Progress Notes (Signed)
CSW met with [name] at bedside to assess need for support. [name] and this Probation officer met privately where [name] was able to talk through her emotions and the way she is feeling currently. [name] expressed hx of PPD and notes she is currently feeling overwhelmed. [name] notes in addition to bays current condition, she has a lot of psychosocial stressors such as car trouble occurring in her life and she feels she cannot do anything to control the circumstances. [name] further notes he has her husband as a support; however, he does not understand the emotional side of it and is constantly asking her why she is crying. [name] notes her husband is not being rude when he ask this; however, he has a hard time understanding where she is coming from. This Probation officer was empathetic towards [name] and provided clinical support of [name]'s emotions. CSW offered coping skills to include taking advantage of the time that dad is at bedside and available to watch baby to allow her to go for a walk to process her feelings within herself. CSW encouraged [name] not to keep her emotions bottled up as that will only make thing worse. Additionally, CSW encouraged [name] to try to obtain sleep as she previously expressed she has not been sleeping. This Probation officer encouraged [name] to journal about her feelings and identify a support person in her life other than her husband that she can trust her feelings with. [name] noted walking does usually help her feel better as being stuck in the hospital room gets to her at times. CSW encouraged [name] to be absent from the room as long as she feels necessary so long as FOB is present and caring to baby. [name] was thankful for this Probation officer meeting with her and noted she feels better after processing. CSW offered ongoing support of needs throughtout the remainder of hospital stay If needed. [name] was thankful. No other needs addressed at this time.   Billyjack Trompeter, MSW, LCSW-A Clinical Social Worker  [facility]  Office:  [phone]

## 2016-12-11 ENCOUNTER — Telehealth: Payer: Self-pay | Admitting: *Deleted

## 2016-12-11 NOTE — Telephone Encounter (Signed)
Patient Rx was for #50- too many for insurance- needed prior authorization. Per patient- ok to decrease the number of pills for pain. Pharmacy notified. Dr Jolayne Pantheronstant notified.

## 2016-12-13 ENCOUNTER — Telehealth: Payer: Self-pay | Admitting: *Deleted

## 2016-12-13 ENCOUNTER — Ambulatory Visit (INDEPENDENT_AMBULATORY_CARE_PROVIDER_SITE_OTHER): Payer: Medicaid Other | Admitting: Certified Nurse Midwife

## 2016-12-13 ENCOUNTER — Encounter: Payer: Self-pay | Admitting: Certified Nurse Midwife

## 2016-12-13 VITALS — BP 129/86 | HR 73 | Wt 148.0 lb

## 2016-12-13 DIAGNOSIS — O99345 Other mental disorders complicating the puerperium: Principal | ICD-10-CM

## 2016-12-13 DIAGNOSIS — F53 Postpartum depression: Secondary | ICD-10-CM

## 2016-12-13 MED ORDER — SERTRALINE HCL 50 MG PO TABS: 50.0000 mg | ORAL_TABLET | Freq: Every day | ORAL | 5 refills | Status: DC

## 2016-12-13 MED ORDER — BUPROPION HCL ER (SR) 100 MG PO TB12: 100.0000 mg | ORAL_TABLET | Freq: Two times a day (BID) | ORAL | 5 refills | Status: DC

## 2016-12-13 NOTE — Progress Notes (Signed)
Patient ID: [name] Young, female   DOB: [birth year], 26 y.o.   MRN: [id number]  Chief Complaint  Patient presents with  . Postpartum Care    visti for PP depression     HPI [name] Young is a 26 y.o. female.  Patient was seen at her home for postpartum visit.  Home visiting nurse called the office d/t patient being depressed.  Got an appointment for today.  Is breast feeding.  Postpartum depression score: 17.  Is not sleeping.  FOB/spouse is helpful.  States that baby falls asleep at the breast, then she pumps and feeds the bottle.  Discussed ways to wake baby up at the breast.  Patient denies any homicidal/suicidal thoughts.  Is stressed over feeding infant, no sleep and trying to take care of herself/household.  Discussed medications and counseling.  Journey's referral done.  Has taken Zoloft in the past and did not like the side effects.  Discussed the safety of breast feeding and Wellbutrin.  Has appointment scheduled for postpartum exam/Nexplanon the beginning of September.  Older child is staying with grandparents out of town for a while.  Spouse and patient are arguing a lot currently d/t lack of sleep and frustration.  Encouragement given.  Encouraged to get breast feeding bras.    HPI  Past Medical History:  Diagnosis Date  . Depression   . Hx MRSA infection   . Medical history non-contributory   . Pregnancy induced hypertension     Past Surgical History:  Procedure Laterality Date  . CESAREAN SECTION N/A 02/17/2014   Procedure: CESAREAN SECTION;  Surgeon: Tereso Newcomer, MD;  Location: [facility] ORS;  Service: Obstetrics;  Laterality: N/A;  . CESAREAN SECTION N/A 01/08/2015   Procedure: CESAREAN SECTION;  Surgeon: Tereso Newcomer, MD;  Location: [facility] ORS;  Service: Obstetrics;  Laterality: N/A;  . CESAREAN SECTION N/A 12/05/2016   Procedure: CESAREAN SECTION;  Surgeon: Levie Heritage, DO;  Location: [facility] BIRTHING SUITES;  Service: Obstetrics;  Laterality: N/A;    Family History  Problem  Relation Age of Onset  . Hypertension Mother   . Hypertension Father     Social History Social History  Substance Use Topics  . Smoking status: Never Smoker  . Smokeless tobacco: Never Used  . Alcohol use No    No Known Allergies  Current Outpatient Prescriptions  Medication Sig Dispense Refill  . ibuprofen (ADVIL,MOTRIN) 600 MG tablet Take 1 tablet (600 mg total) by mouth every 6 (six) hours as needed. 30 tablet 0  . oxyCODONE (OXY IR/ROXICODONE) 5 MG immediate release tablet Take 1 tablet (5 mg total) by mouth every 4 (four) hours as needed (pain scale 4-7). 50 tablet 0  . buPROPion (WELLBUTRIN SR) 100 MG 12 hr tablet Take 1 tablet (100 mg total) by mouth 2 (two) times daily. 60 tablet 5   No current facility-administered medications for this visit.     Review of Systems Review of Systems Constitutional: negative for fatigue and weight loss Respiratory: negative for cough and wheezing Cardiovascular: negative for chest pain, fatigue and palpitations Gastrointestinal: negative for abdominal pain and change in bowel habits Genitourinary:negative Integument/breast: negative for nipple discharge Musculoskeletal:negative for myalgias Neurological: negative for gait problems and tremors Behavioral/Psych: negative for abusive relationship, + for postpartum depression Endocrine: negative for temperature intolerance      Blood pressure 129/86, pulse 73, weight 148 lb (67.1 kg), unknown if currently breastfeeding.  Physical Exam Physical Exam General:   alert  Skin:   no rash  or abnormalities  Lungs:   clear to auscultation bilaterally  Heart:   regular rate and rhythm, S1, S2 normal, no murmur, click, rub or gallop  Breasts:   normal without suspicious masses, skin or nipple changes or axillary nodes  Abdomen:  normal findings: no organomegaly, soft, non-tender and no hernia C-section wound: C/D/I, no s/s infection noted. Steri strips in place.   Pelvis:  deferred    50% of  20 min visit spent on counseling and coordination of care.    Data Reviewed Previous medical hx, meds, labs  Assessment      Due for pap smear  Postpartum depression vs baby blues  breast feeding status    Plan    Orders Placed This Encounter  Procedures  . Ambulatory referral to Integrated Behavioral Health    Referral Priority:   Routine    Referral Type:   Consultation    Referral Reason:   Specialty Services Required    Requested Specialty:   Licensed Clinical Social Worker    Number of Visits Requested:   1   Meds ordered this encounter  Medications  . DISCONTD: sertraline (ZOLOFT) 50 MG tablet    Sig: Take 1 tablet (50 mg total) by mouth daily.    Dispense:  30 tablet    Refill:  5  . buPROPion (WELLBUTRIN SR) 100 MG 12 hr tablet    Sig: Take 1 tablet (100 mg total) by mouth 2 (two) times daily.    Dispense:  60 tablet    Refill:  5    Follow up as scheduled.

## 2016-12-13 NOTE — Telephone Encounter (Signed)
Maria,RN  Calling to report at home visit- patient is scoring a 16 on depression scale. She does have a history of PP depression with previous delivery. BP 160/70  R BP 150/72  L  She will be going back out to check the baby next week 8/23.

## 2016-12-13 NOTE — Progress Notes (Signed)
Pt is seen in office today after call from Home Nurse to report increase depression score.  Depression score today = 17. Pt states she feels overwhelmed at times. Pt states that she has good support system, husband and best friend, family is out of town.

## 2017-01-03 ENCOUNTER — Ambulatory Visit: Payer: Medicaid Other | Admitting: Certified Nurse Midwife

## 2017-01-15 ENCOUNTER — Telehealth: Payer: Self-pay | Admitting: *Deleted

## 2017-01-15 NOTE — Telephone Encounter (Signed)
LM for patient to call office- ? If she is taking antidepressive medication.

## 2017-02-04 IMAGING — US US OB TRANSVAGINAL
1 series · 15 of 28 positions shown · non-contrast
Comparison: None.

CLINICAL DATA: Abdominal pain with cramping.  Vaginal spotting.

EXAM:
OBSTETRIC <14 WK US AND TRANSVAGINAL OB US
TECHNIQUE: Both transabdominal and transvaginal ultrasound examinations were
performed for complete evaluation of the gestation as well as the
maternal uterus, adnexal regions, and pelvic cul-de-sac.
Transvaginal technique was performed to assess early pregnancy.

[Series 1: us ob transvaginal · 15 of 49 slices shown]
[im 1/49]
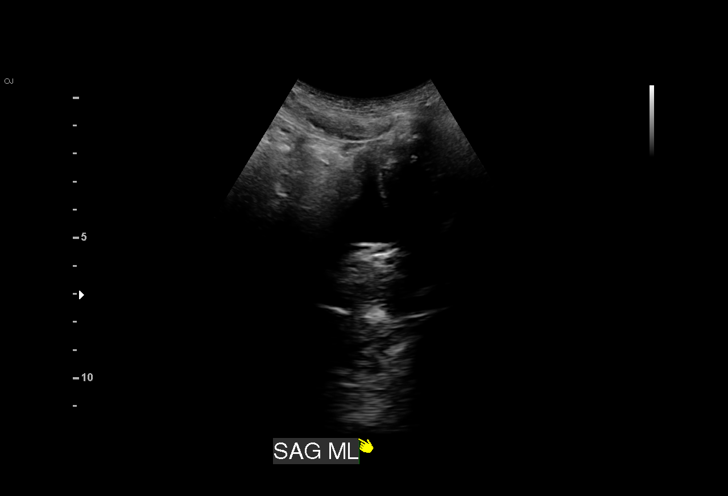
[im 4/49]
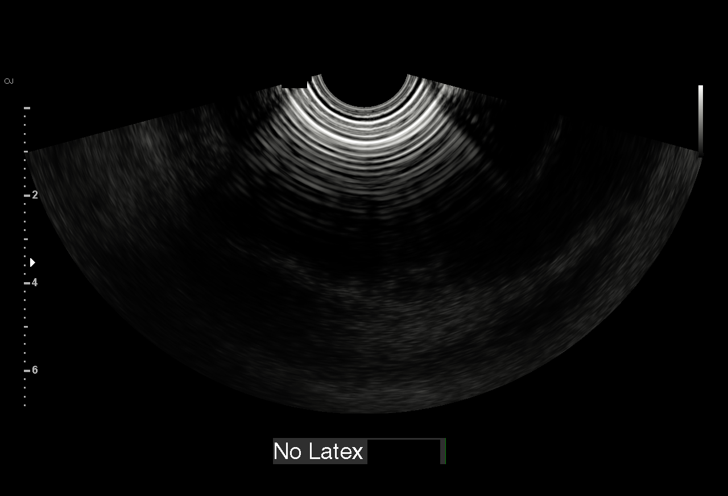
[im 8/49]
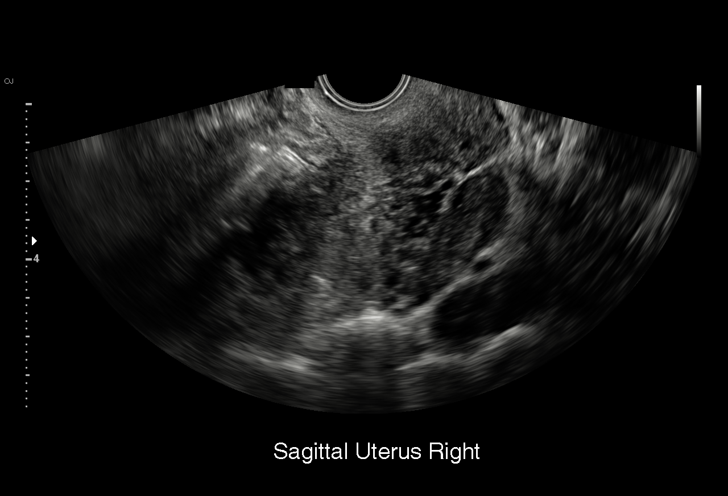
[im 11/49]
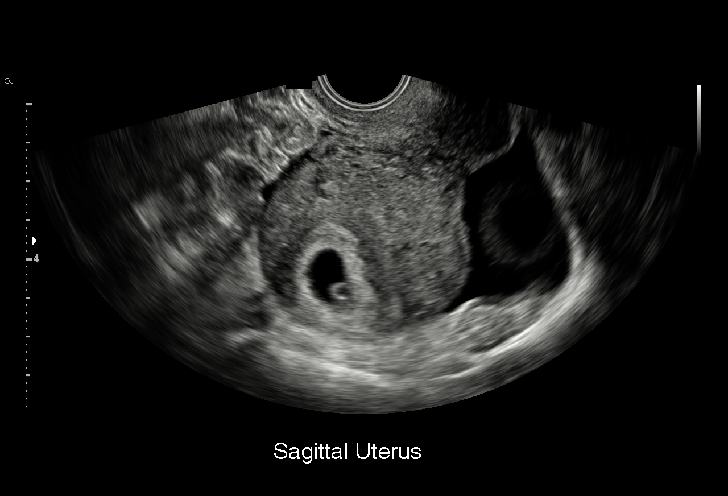
[im 15/49]
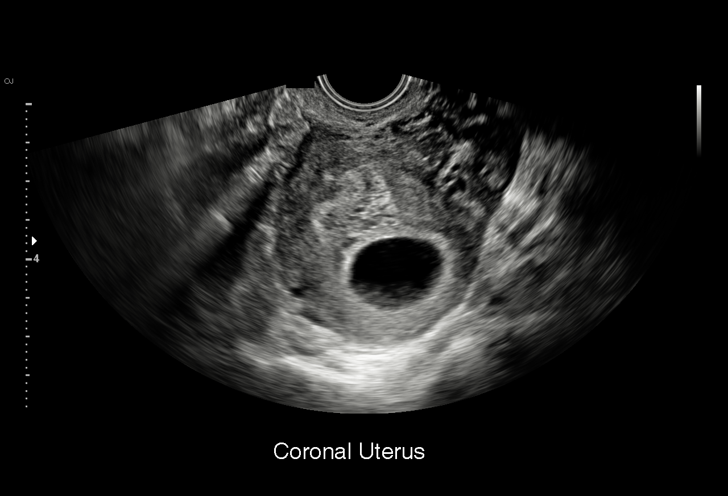
[im 18/49]
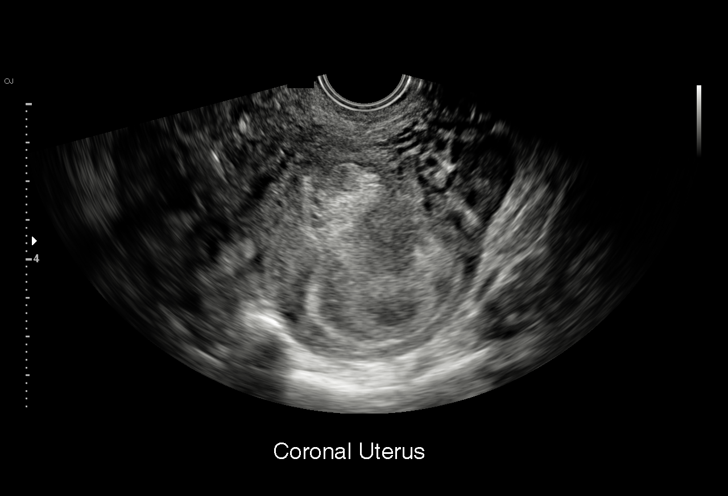
[im 22/49]
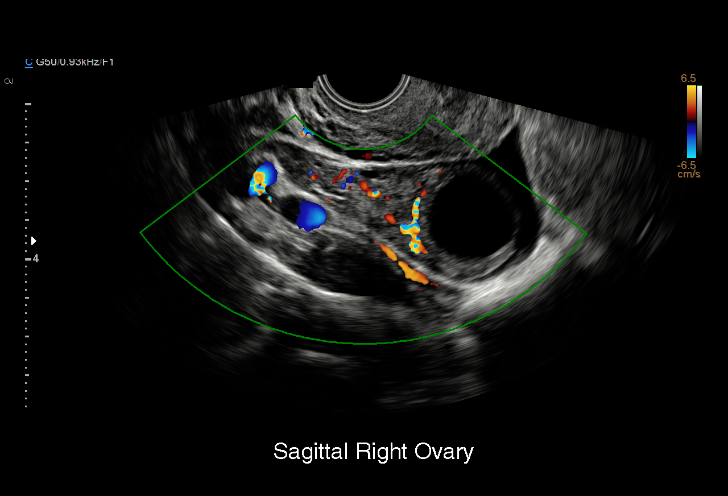
[im 25/49]
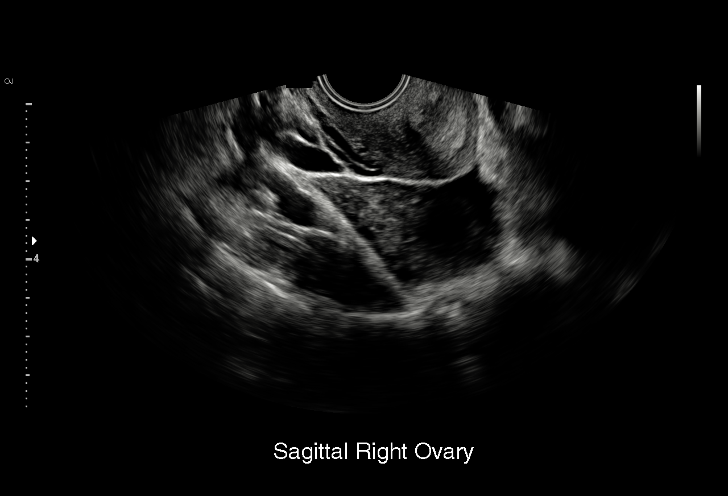
[im 27/49]
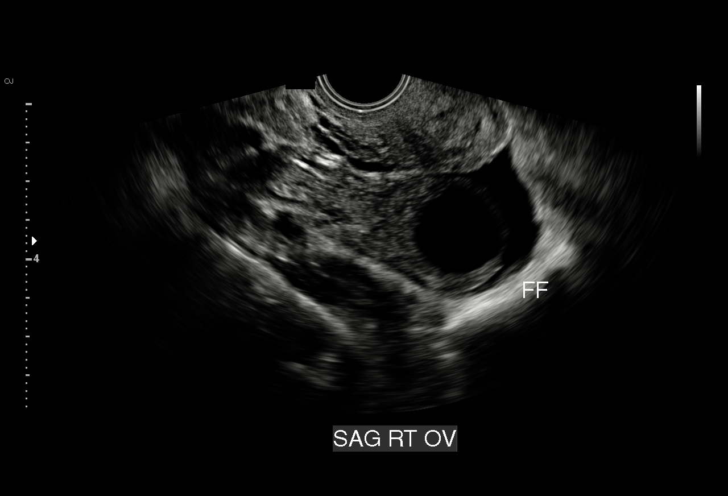
[im 31/49]
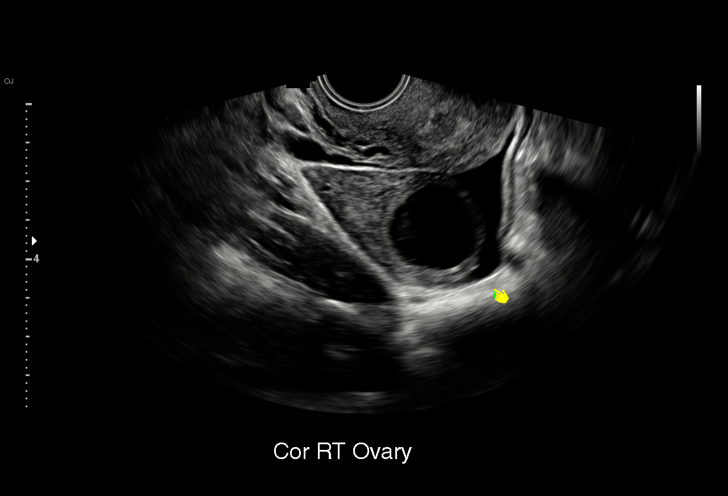
[im 34/49]
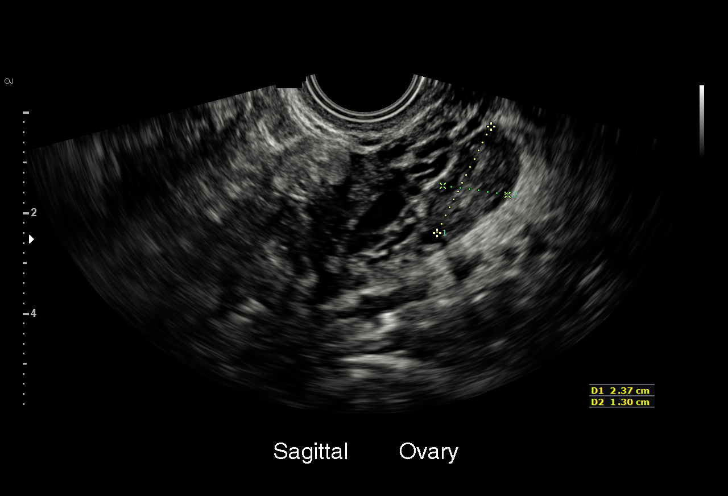
[im 38/49]
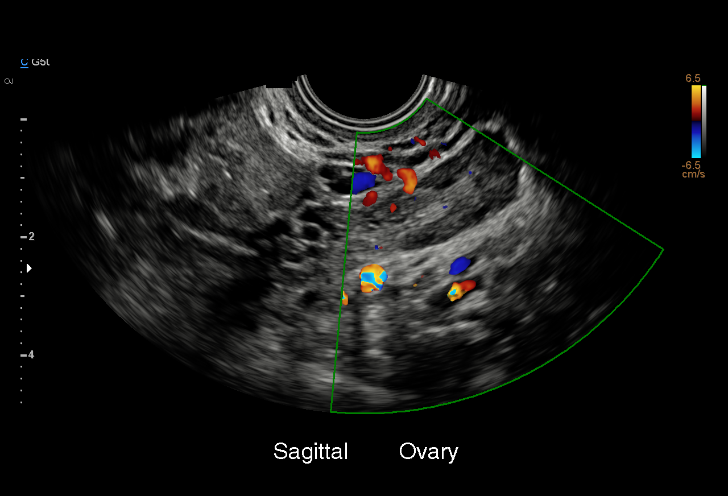
[im 41/49]
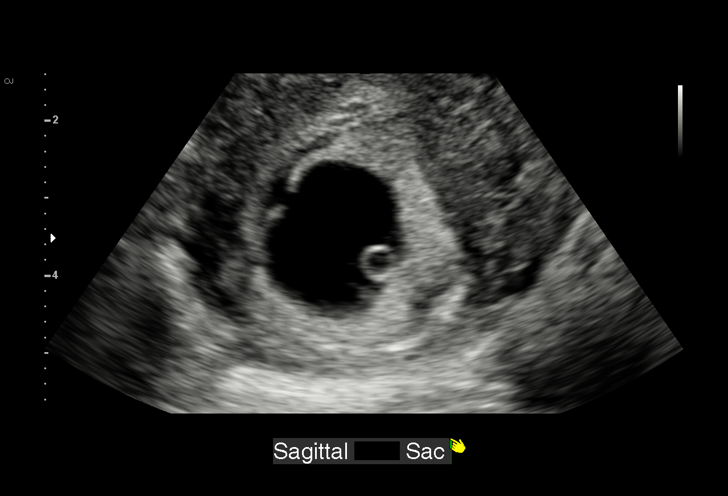
[im 45/49]
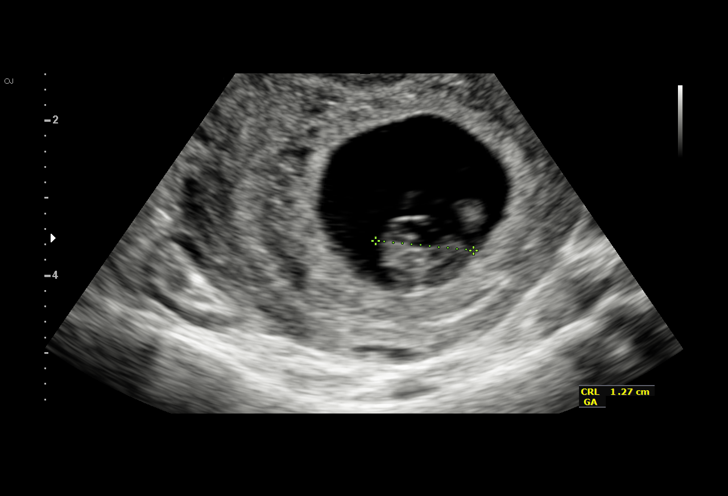
[im 49/49]
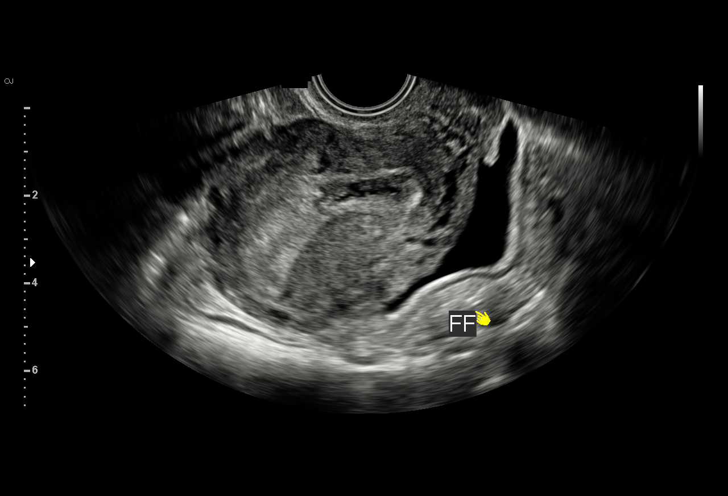

[15 of 28 positions shown; findings below may reference images not displayed]

FINDINGS: Intrauterine gestational sac: Single.

Yolk sac:  Visualized.

Embryo:  Visualized.

Cardiac Activity: Visualized.

Heart Rate: 137  bpm

CRL:  13  mm   7 w   3 d                  US EDC: 12/11/2016

Subchorionic hemorrhage:  None.

Maternal uterus/adnexae: Maternal ovaries are unremarkable. Small
volume intraperitoneal free fluid identified in the cul-de-sac and
adjacent to the right ovary which contains an apparent corpus luteum
cyst.
IMPRESSION: Single living intrauterine gestation at estimated 7 week 3 day
gestational age by crown-rump length.

## 2017-05-28 IMAGING — US US MFM OB COMP +14 WKS
1 series · 14 of 28 positions shown · non-contrast
Comparison: none

[Series 1: us mfm ob comp +14 wks · 99 acquisitions, 14 frames shown]
[im 4/99]
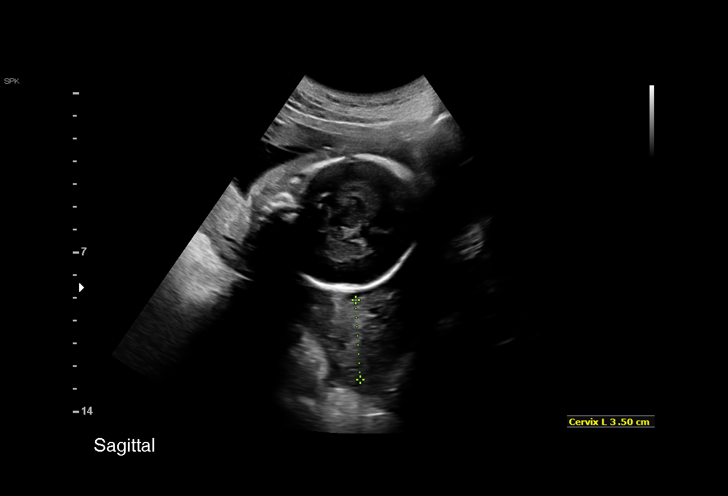
[im 11/99]
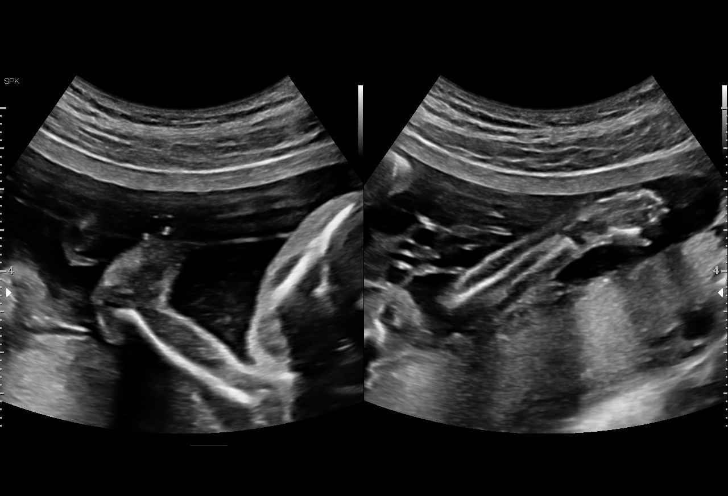
[im 19/99]
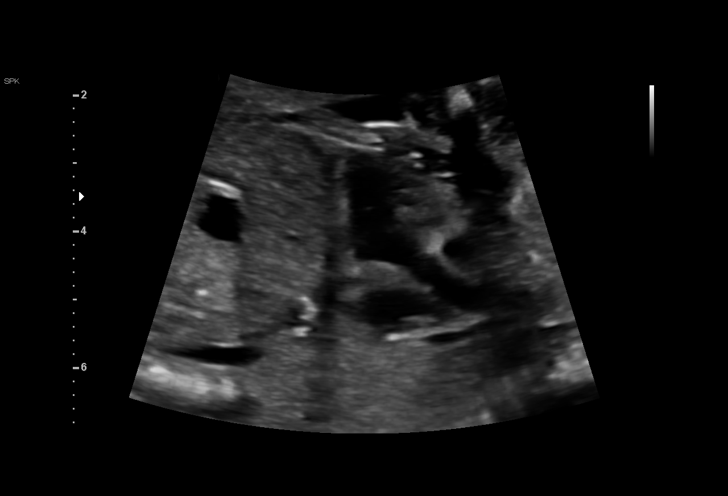
[im 26/99]
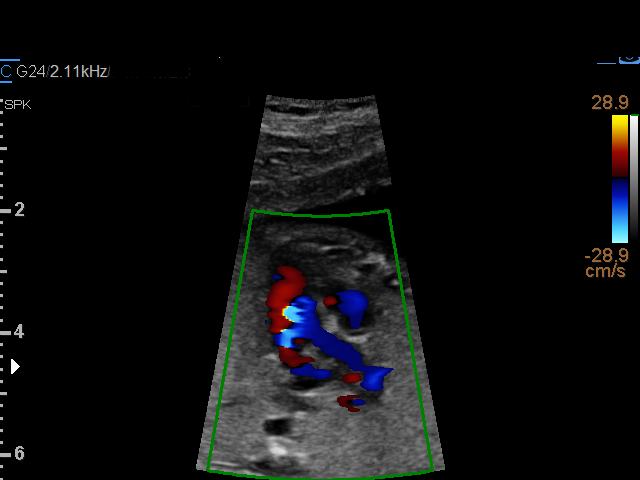
[im 33/99]
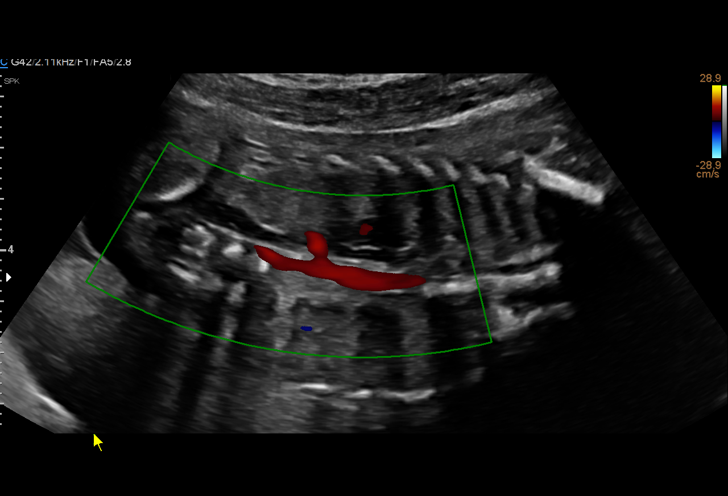
[im 40/99]
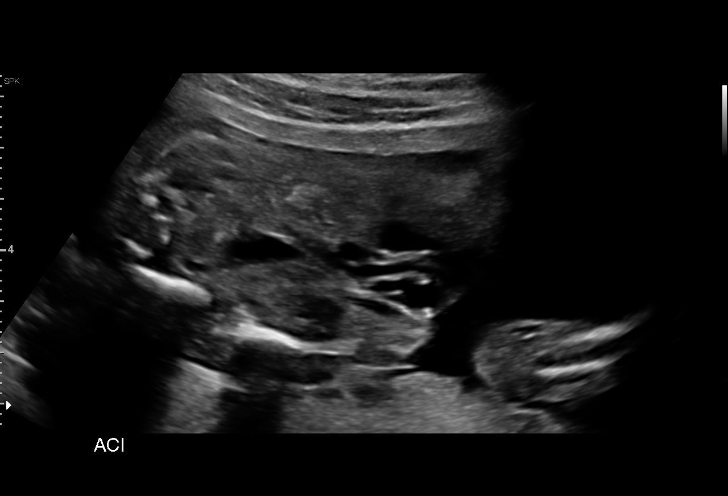
[im 48/99]
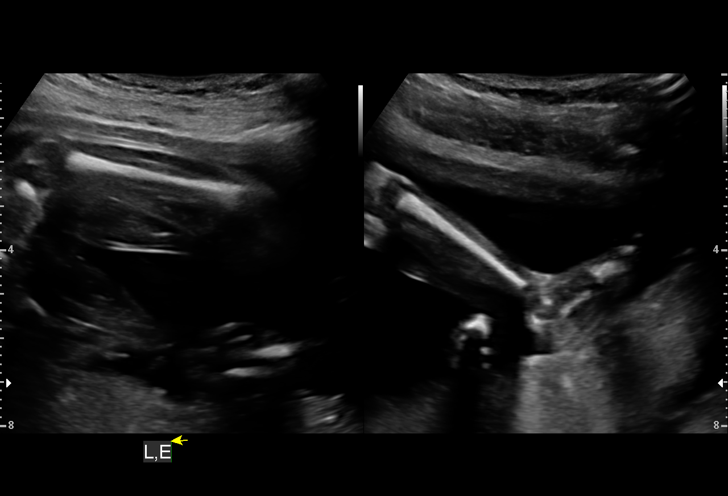
[im 55/99]
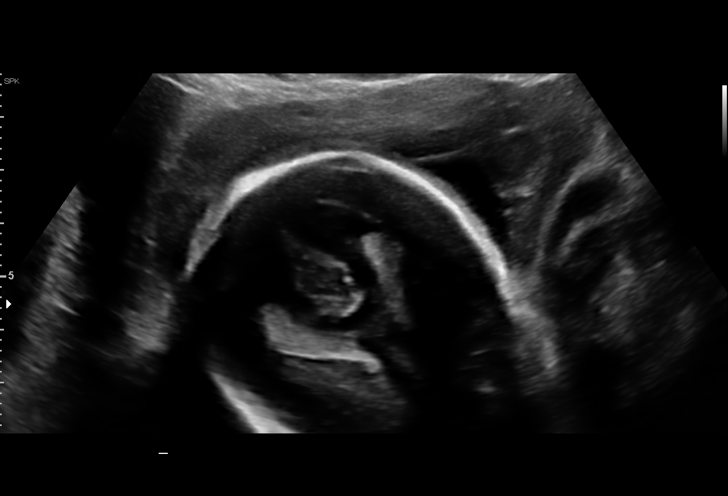
[im 62/99]
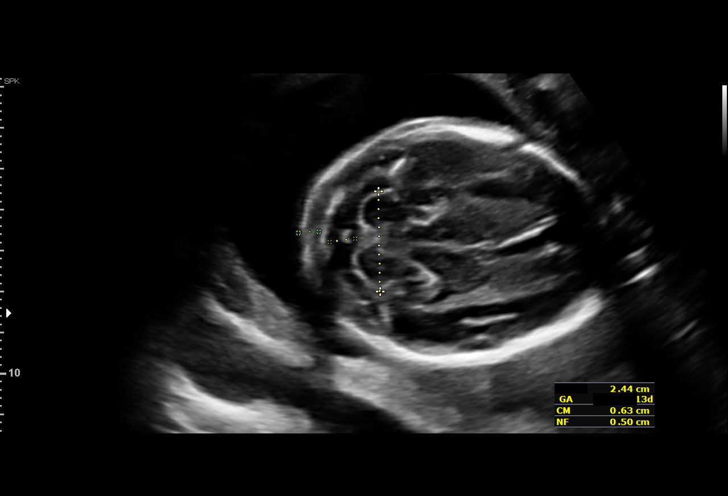
[im 69/99]
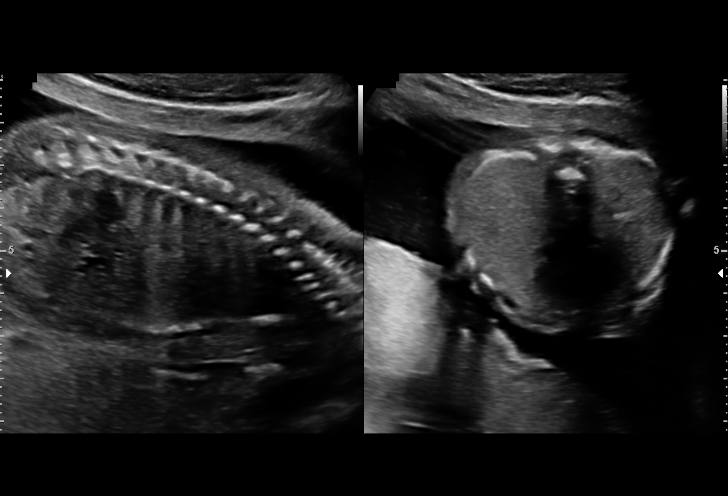
[im 77/99]
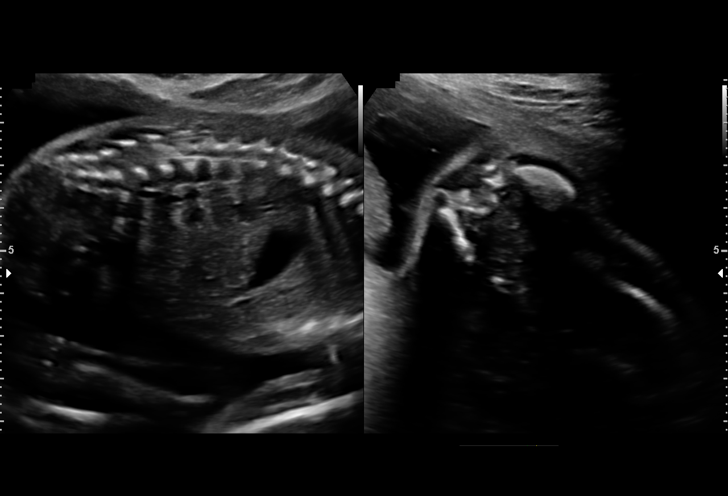
[im 84/99]
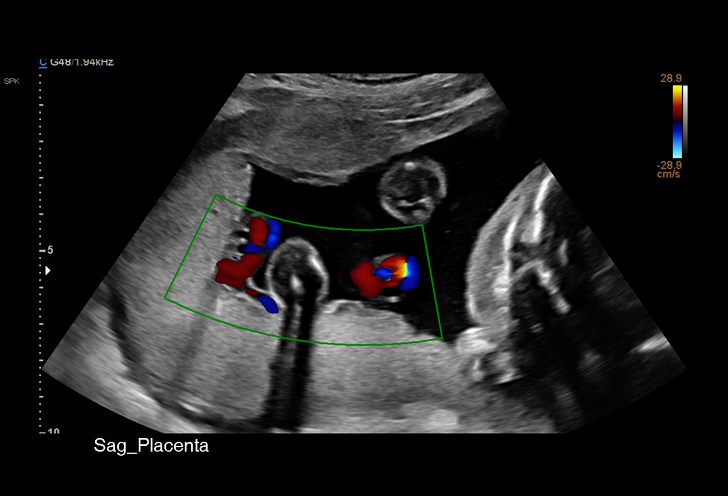
[im 91/99]
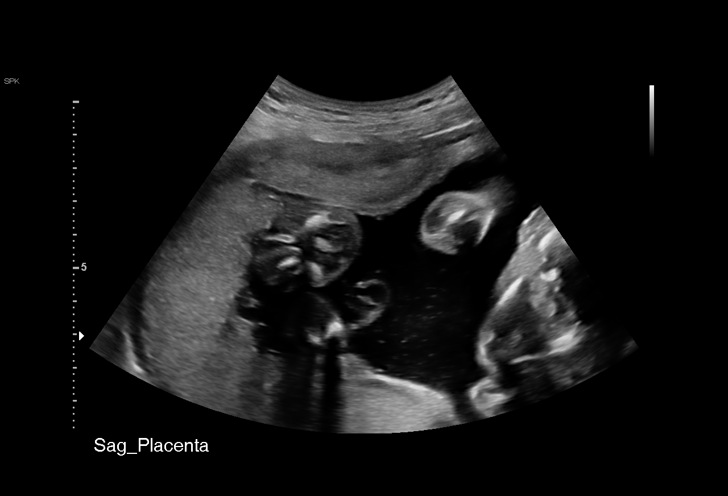
[im 99/99]
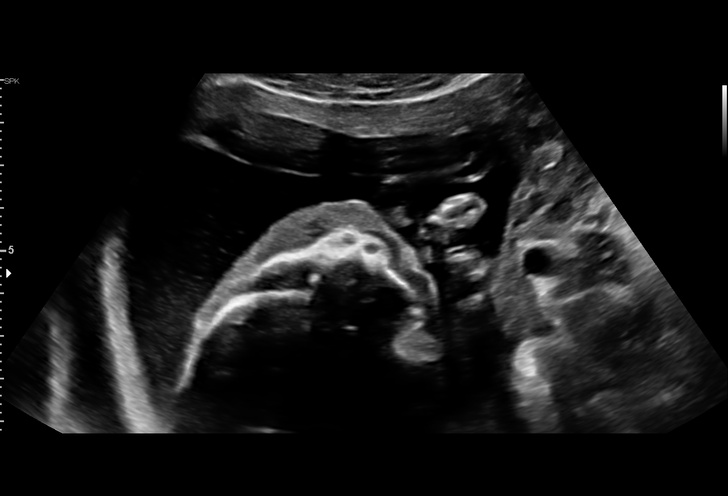

[14 of 28 positions shown; findings below may reference images not displayed]

1  IMAEKA ITALLIANO             [id number]      [id number]     [id number]
Indications

23 weeks gestation of pregnancy
Encounter for antenatal screening,
unspecified
Severe preeclampsia, third trimester(
previous)
Poor obstetric history: Previous preterm
delivery, antepartum ( 33 weeks)
OB History

Blood Type:            Height:  5'3"   Weight (lb):  151      BMI:
Gravidity:    3         Term:   2        Prem:   1
Living:       2
Fetal Evaluation

Num Of Fetuses:     1
Fetal Heart         144
Rate(bpm):
Cardiac Activity:   Observed
Presentation:       Cephalic
Placenta:           Posterior, above cervical os
P. Cord Insertion:  Visualized, central

Amniotic Fluid
AFI FV:      Subjectively within normal limits

Largest Pocket(cm)
4.03
Biometry
BPD:      55.8  mm     G. Age:  23w 0d         25  %    CI:        74.88   %   70 - 86
FL/HC:      19.8   %   18.7 -
HC:      204.6  mm     G. Age:  22w 4d          8  %    HC/AC:      1.10       1.05 -
AC:      186.6  mm     G. Age:  23w 3d         38  %    FL/BPD:     72.8   %   71 - 87
FL:       40.6  mm     G. Age:  23w 1d         25  %    FL/AC:      21.8   %   20 - 24
HUM:      37.5  mm     G. Age:  23w 1d         33  %
CER:      24.4  mm     G. Age:  22w 3d         28  %
NFT:         5  mm
CM:        6.3  mm

Est. FW:     574  gm      1 lb 4 oz     45  %
Gestational Age

U/S Today:     23w 0d                                        EDD:   12/15/16
Best:          23w 4d    Det. By:   Previous Ultrasound      EDD:   12/11/16
(04/27/16)
Anatomy

Cranium:               Appears normal         Aortic Arch:            Appears normal
Cavum:                 Appears normal         Ductal Arch:            Not well visualized
Ventricles:            Appears normal         Diaphragm:              Appears normal
Choroid Plexus:        Appears normal         Stomach:                Appears normal, left
sided
Cerebellum:            Appears normal         Abdomen:                Appears normal
Posterior Fossa:       Appears normal         Abdominal Wall:         Appears nml (cord
insert, abd wall)
Nuchal Fold:           Appears normal         Cord Vessels:           Appears normal (3
vessel cord)
Face:                  Appears normal         Kidneys:                Appear normal
(orbits and profile)
Lips:                  Appears normal         Bladder:                Appears normal
Thoracic:              Appears normal         Spine:                  Appears normal
Heart:                 Appears normal         Upper Extremities:      Appears normal
(4CH, axis, and situs
RVOT:                  Appears normal         Lower Extremities:      Appears normal
LVOT:                  Appears normal

Other:  Parents do not wish to know sex of fetus. Female gender. Both heels
and 5th digits noted
Cervix Uterus Adnexa

Cervix
Length:           3.16  cm.
Normal appearance by transabdominal scan.

Uterus
No abnormality visualized.

Left Ovary
Within normal limits.

Right Ovary
Within normal limits.

Adnexa:       No abnormality visualized.
Impression

Singleton intrauterine pregnancy at 23+4 weeks here for
anatomic survey
Review of the anatomy shows no sonographic markers for
aneuploidy or structural anomalies
Amniotic fluid volume is normal
Estimated fetal weight is 574g which is growth in the 45th
percentile
Recommendations

Ductal arch was not well visualized. At your discretion a
repeat attempt at visualization can be made in 4 weeks

## 2017-07-02 ENCOUNTER — Encounter: Payer: Self-pay | Admitting: *Deleted

## 2017-07-06 IMAGING — US US MFM OB FOLLOW-UP
1 series · 14 of 28 positions shown · non-contrast
Comparison: none

[Series 1: us mfm ob follow-up · 37 acquisitions, 14 frames shown]
[im 2/37]
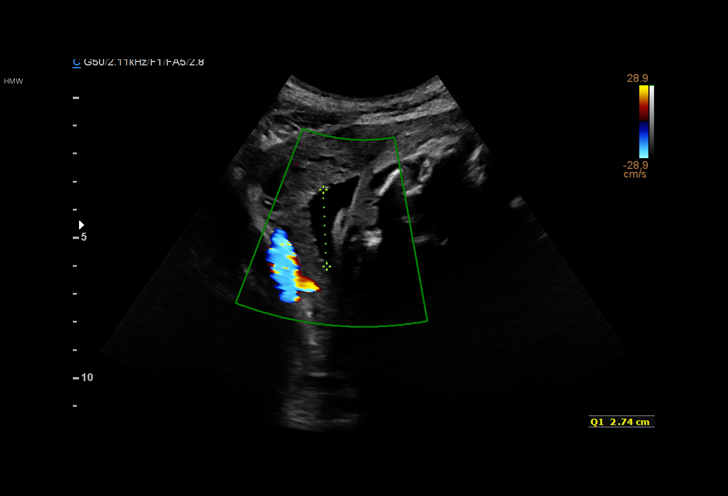
[im 5/37]
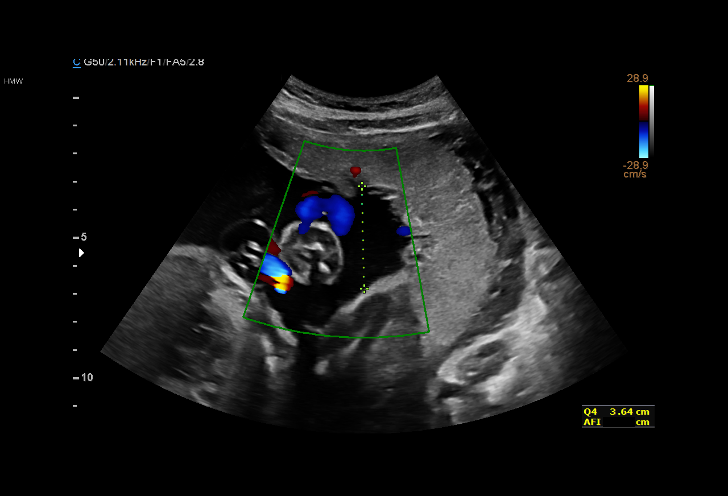
[im 7/37]
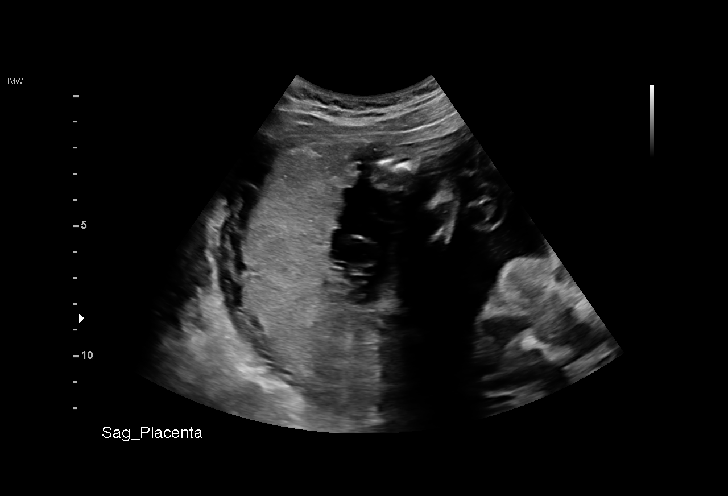
[im 10/37]
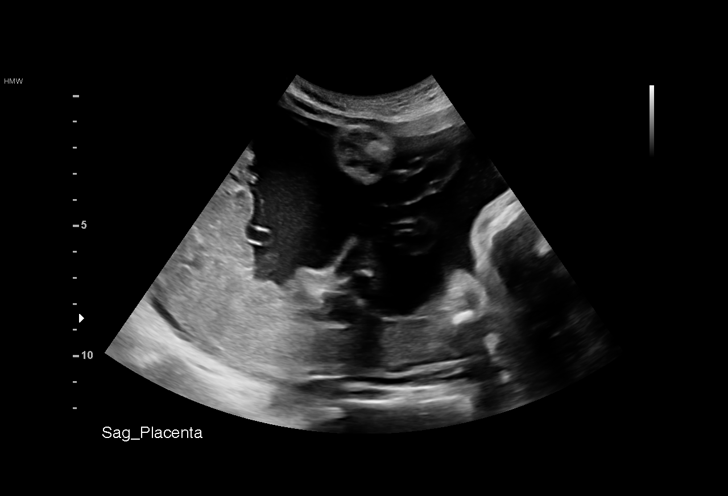
[im 13/37]
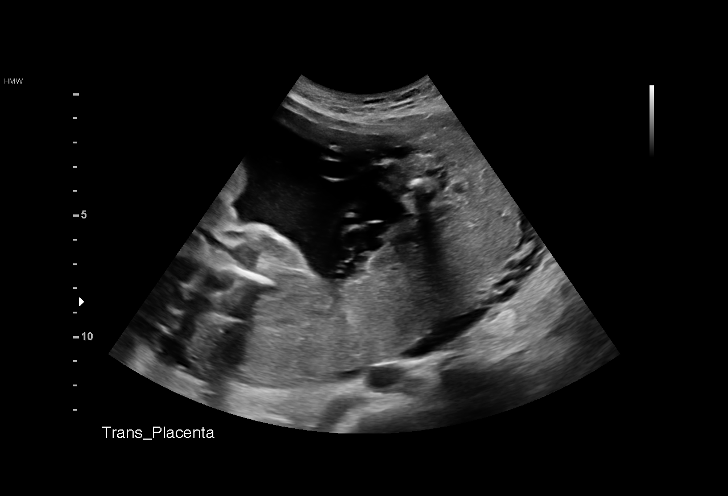
[im 15/37]
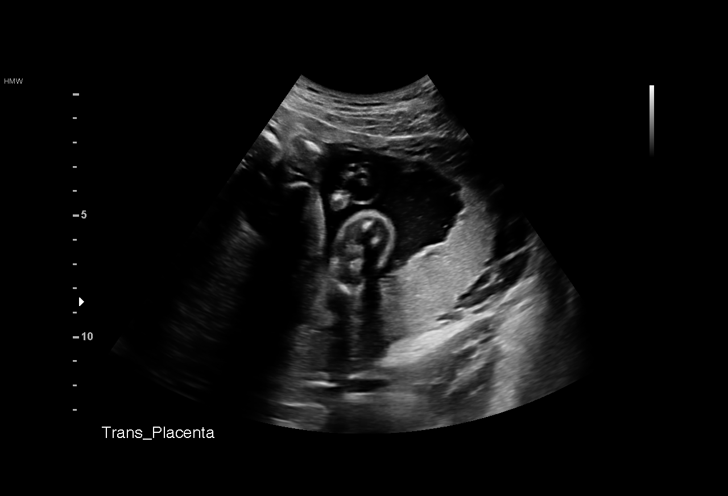
[im 18/37]
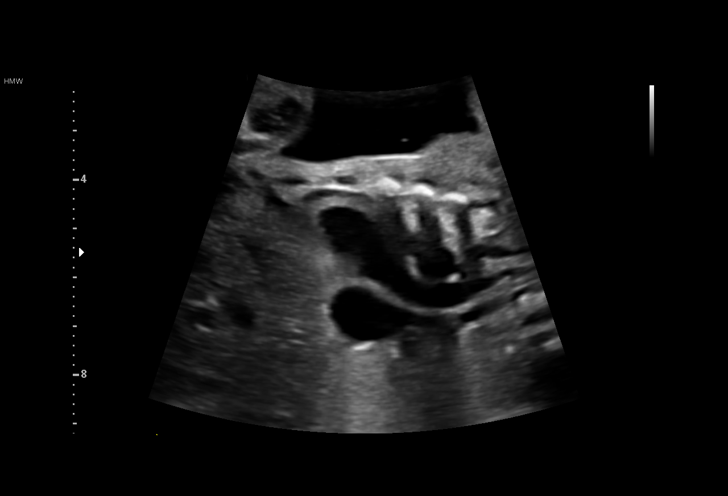
[im 21/37]
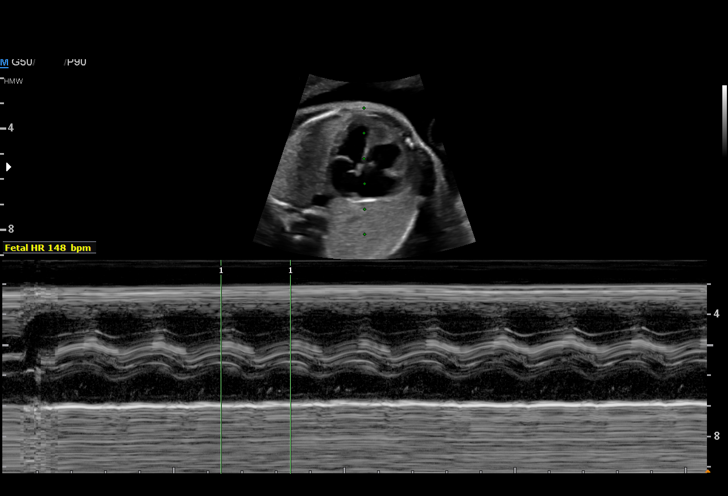
[im 23/37]
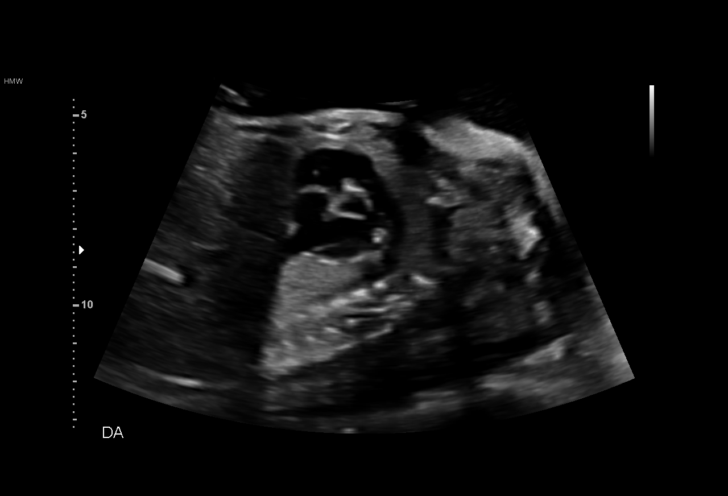
[im 26/37]
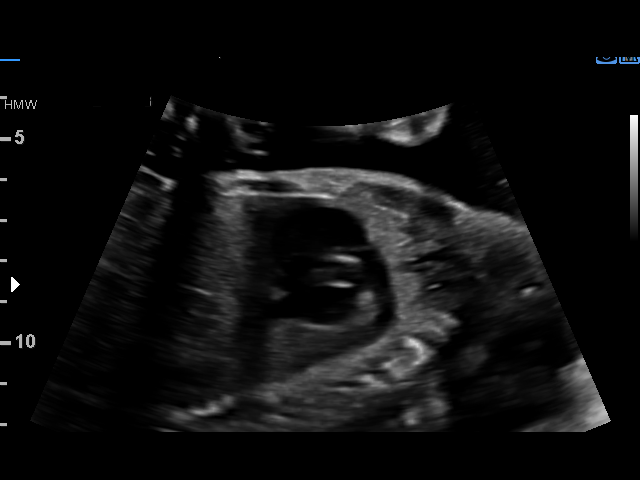
[im 29/37]
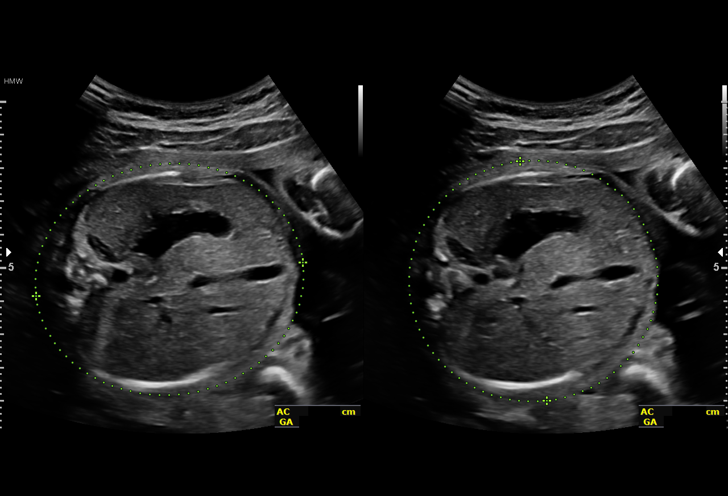
[im 31/37]
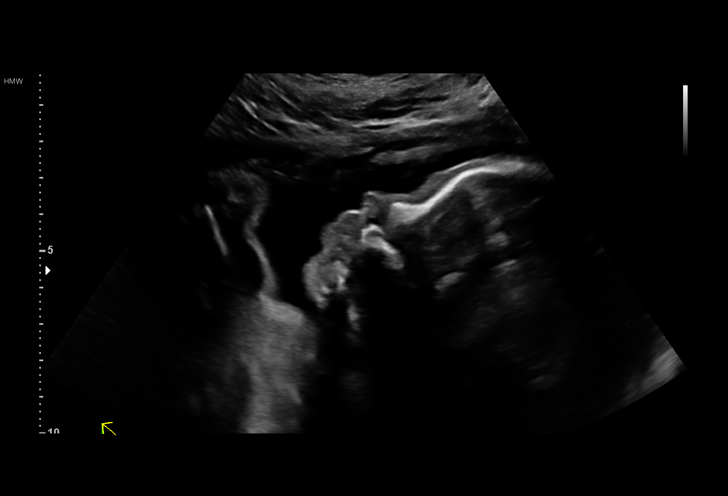
[im 34/37]
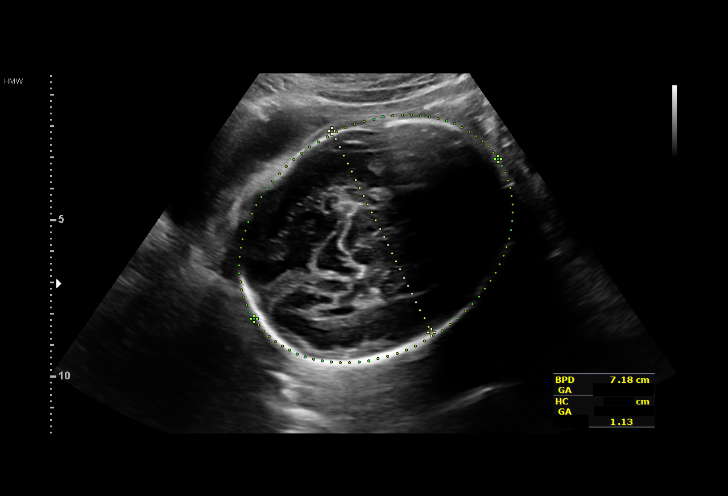
[im 37/37]
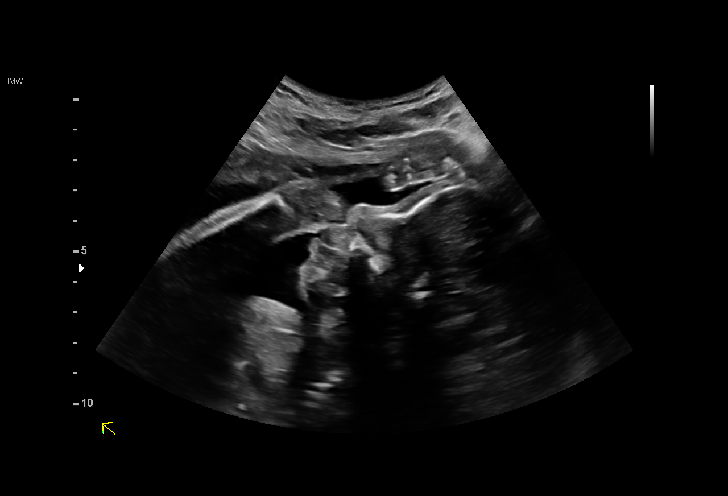

[14 of 28 positions shown; findings below may reference images not displayed]

Indications

29 weeks gestation of pregnancy
Poor obstetric history: Previous preterm
delivery, antepartum ( 33 weeks)
Poor obstetric history: Previous
preeclampsia / eclampsia/gestational HTN
Encounter for other antenatal screening
follow-up
OB History

Blood Type:            Height:  5'3"   Weight (lb):  151      BMI:
Gravidity:    3         Term:   2        Prem:   1
Living:       2
Fetal Evaluation

Num Of Fetuses:     1
Fetal Heart         148
Rate(bpm):
Cardiac Activity:   Observed
Presentation:       Cephalic
Placenta:           Posterior, above cervical os
P. Cord Insertion:  Visualized, central

Amniotic Fluid
AFI FV:      Subjectively within normal limits

AFI Sum(cm)     %Tile       Largest Pocket(cm)
14.56           50

RUQ(cm)       RLQ(cm)       LUQ(cm)        LLQ(cm)
2.74
Biometry

BPD:      71.2  mm     G. Age:  28w 4d         21  %    CI:        74.02   %   70 - 86
FL/HC:      20.0   %   19.6 -
HC:      262.8  mm     G. Age:  28w 4d          9  %    HC/AC:      1.11       0.99 -
AC:      236.5  mm     G. Age:  28w 0d         14  %    FL/BPD:     73.9   %   71 - 87
FL:       52.6  mm     G. Age:  28w 0d         10  %    FL/AC:      22.2   %   20 - 24
HUM:        48  mm     G. Age:  28w 1d         26  %

Est. FW:    3380  gm      2 lb 9 oz     31  %
Gestational Age

U/S Today:     28w 2d                                        EDD:   12/17/16
Best:          29w 1d    Det. By:   Previous Ultrasound      EDD:   12/11/16
(04/27/16)
Anatomy

Cranium:               Appears normal         Aortic Arch:            Previously seen
Cavum:                 Previously seen        Ductal Arch:            Appears normal
Ventricles:            Appears normal         Diaphragm:              Previously seen
Choroid Plexus:        Previously seen        Stomach:                Appears normal, left
sided
Cerebellum:            Previously seen        Abdomen:                Previously seen
Posterior Fossa:       Previously seen        Abdominal Wall:         Previously seen
Nuchal Fold:           Previously seen        Cord Vessels:           Previously seen
Face:                  Orbits and profile     Kidneys:                Appear normal
previously seen
Lips:                  Previously seen        Bladder:                Appears normal
Thoracic:              Appears normal         Spine:                  Previously seen
Heart:                 Appears normal         Upper Extremities:      Previously seen
(4CH, axis, and situs
RVOT:                  Previously seen        Lower Extremities:      Previously seen
LVOT:                  Appears normal

Other:  Parents do not wish to know sex of fetus. Female gender. Both heels
and 5th digits noted previously.
Impression

Single IUP at 29w 1d
Normal interval anatomy
Fetal growth is appropriate (31st %tile)
Posterior placenta without previa
Normal amniotic fluid volume
Recommendations

Follow-up ultrasounds as clinically indicated.

## 2017-07-30 ENCOUNTER — Inpatient Hospital Stay (HOSPITAL_COMMUNITY)
Admission: AD | Admit: 2017-07-30 | Discharge: 2017-07-30 | Discharge status: Against Medical Advice | Payer: Medicaid Other | Source: Ambulatory Visit | Attending: Obstetrics and Gynecology | Admitting: Obstetrics and Gynecology

## 2017-07-30 NOTE — MAU Note (Signed)
Not in lobby

## 2017-07-30 NOTE — MAU Note (Signed)
#  3 not in lobby 

## 2017-11-05 ENCOUNTER — Inpatient Hospital Stay (HOSPITAL_COMMUNITY): Admission: AD | Admit: 2017-11-05 | Payer: Medicaid Other | Admitting: Obstetrics and Gynecology

## 2017-11-06 ENCOUNTER — Ambulatory Visit (INDEPENDENT_AMBULATORY_CARE_PROVIDER_SITE_OTHER): Payer: Medicaid Other

## 2017-11-06 DIAGNOSIS — O0992 Supervision of high risk pregnancy, unspecified, second trimester: Secondary | ICD-10-CM

## 2017-11-06 DIAGNOSIS — N912 Amenorrhea, unspecified: Secondary | ICD-10-CM | POA: Diagnosis not present

## 2017-11-06 DIAGNOSIS — Z3201 Encounter for pregnancy test, result positive: Secondary | ICD-10-CM

## 2017-11-06 LAB — POCT URINE PREGNANCY: Preg Test, Ur: POSITIVE — AB

## 2017-11-06 NOTE — Progress Notes (Signed)
Ms. [name] Young presents today for UPT. She has no unusual complaints . LMP: 04/15/2017    OBJECTIVE: Appears well, in no apparent distress.  OB History    Gravida  3   Para  3   Term  2   Preterm  1   AB  0   Living  3     SAB  0   TAB  0   Ectopic  0   Multiple  0   Live Births  3          Home UPT Result:Positive  In Feb 2019 In-Office UPT result: Positive  I have reviewed the patient's medical, obstetrical, social, and family histories, and medications.   ASSESSMENT: Positive pregnancy test  PLAN Prenatal care to be completed at: [facility] [place] / adoption info given to patient.

## 2017-11-08 ENCOUNTER — Encounter (HOSPITAL_COMMUNITY): Payer: Self-pay

## 2017-11-12 ENCOUNTER — Inpatient Hospital Stay (HOSPITAL_COMMUNITY)
Admission: AD | Admit: 2017-11-12 | Discharge: 2017-11-12 | Disposition: A | Discharge status: Home / Self Care | Payer: Medicaid Other | Source: Ambulatory Visit | Attending: Obstetrics & Gynecology | Admitting: Obstetrics & Gynecology

## 2017-11-12 ENCOUNTER — Encounter (HOSPITAL_COMMUNITY): Payer: Self-pay

## 2017-11-12 ENCOUNTER — Other Ambulatory Visit: Payer: Self-pay

## 2017-11-12 ENCOUNTER — Encounter: Payer: Self-pay | Admitting: Obstetrics

## 2017-11-12 ENCOUNTER — Other Ambulatory Visit: Payer: Medicaid Other

## 2017-11-12 ENCOUNTER — Other Ambulatory Visit (HOSPITAL_COMMUNITY)
Admission: RE | Admit: 2017-11-12 | Discharge: 2017-11-12 | Disposition: A | Discharge status: Home / Self Care | Payer: Medicaid Other | Source: Ambulatory Visit | Attending: Obstetrics | Admitting: Obstetrics

## 2017-11-12 ENCOUNTER — Ambulatory Visit (INDEPENDENT_AMBULATORY_CARE_PROVIDER_SITE_OTHER): Payer: Medicaid Other | Admitting: Obstetrics

## 2017-11-12 VITALS — BP 155/102 | HR 44 | Wt 158.3 lb

## 2017-11-12 DIAGNOSIS — O0993 Supervision of high risk pregnancy, unspecified, third trimester: Secondary | ICD-10-CM | POA: Diagnosis not present

## 2017-11-12 DIAGNOSIS — N898 Other specified noninflammatory disorders of vagina: Secondary | ICD-10-CM

## 2017-11-12 DIAGNOSIS — O09899 Supervision of other high risk pregnancies, unspecified trimester: Secondary | ICD-10-CM

## 2017-11-12 DIAGNOSIS — O26893 Other specified pregnancy related conditions, third trimester: Secondary | ICD-10-CM

## 2017-11-12 DIAGNOSIS — O09213 Supervision of pregnancy with history of pre-term labor, third trimester: Secondary | ICD-10-CM

## 2017-11-12 DIAGNOSIS — O99343 Other mental disorders complicating pregnancy, third trimester: Secondary | ICD-10-CM | POA: Insufficient documentation

## 2017-11-12 DIAGNOSIS — O09293 Supervision of pregnancy with other poor reproductive or obstetric history, third trimester: Secondary | ICD-10-CM

## 2017-11-12 DIAGNOSIS — Z3A33 33 weeks gestation of pregnancy: Secondary | ICD-10-CM | POA: Diagnosis not present

## 2017-11-12 DIAGNOSIS — Z3A30 30 weeks gestation of pregnancy: Secondary | ICD-10-CM | POA: Diagnosis not present

## 2017-11-12 DIAGNOSIS — Z79891 Long term (current) use of opiate analgesic: Secondary | ICD-10-CM | POA: Insufficient documentation

## 2017-11-12 DIAGNOSIS — O34219 Maternal care for unspecified type scar from previous cesarean delivery: Secondary | ICD-10-CM

## 2017-11-12 DIAGNOSIS — O0992 Supervision of high risk pregnancy, unspecified, second trimester: Secondary | ICD-10-CM | POA: Diagnosis not present

## 2017-11-12 DIAGNOSIS — Z3481 Encounter for supervision of other normal pregnancy, first trimester: Secondary | ICD-10-CM

## 2017-11-12 DIAGNOSIS — Z98891 History of uterine scar from previous surgery: Secondary | ICD-10-CM | POA: Insufficient documentation

## 2017-11-12 DIAGNOSIS — Z7982 Long term (current) use of aspirin: Secondary | ICD-10-CM | POA: Insufficient documentation

## 2017-11-12 DIAGNOSIS — O163 Unspecified maternal hypertension, third trimester: Secondary | ICD-10-CM | POA: Diagnosis not present

## 2017-11-12 DIAGNOSIS — O09219 Supervision of pregnancy with history of pre-term labor, unspecified trimester: Secondary | ICD-10-CM

## 2017-11-12 DIAGNOSIS — R001 Bradycardia, unspecified: Secondary | ICD-10-CM | POA: Diagnosis not present

## 2017-11-12 DIAGNOSIS — Z8249 Family history of ischemic heart disease and other diseases of the circulatory system: Secondary | ICD-10-CM | POA: Diagnosis not present

## 2017-11-12 DIAGNOSIS — Z3689 Encounter for other specified antenatal screening: Secondary | ICD-10-CM

## 2017-11-12 DIAGNOSIS — Z79899 Other long term (current) drug therapy: Secondary | ICD-10-CM | POA: Diagnosis not present

## 2017-11-12 DIAGNOSIS — O09299 Supervision of pregnancy with other poor reproductive or obstetric history, unspecified trimester: Secondary | ICD-10-CM

## 2017-11-12 DIAGNOSIS — Z6791 Unspecified blood type, Rh negative: Secondary | ICD-10-CM

## 2017-11-12 DIAGNOSIS — Z8759 Personal history of other complications of pregnancy, childbirth and the puerperium: Secondary | ICD-10-CM | POA: Insufficient documentation

## 2017-11-12 DIAGNOSIS — O26899 Other specified pregnancy related conditions, unspecified trimester: Secondary | ICD-10-CM

## 2017-11-12 LAB — CBC
HCT: 32.7 % — ABNORMAL LOW (ref 36.0–46.0)
HEMOGLOBIN: 10.9 g/dL — AB (ref 12.0–15.0)
MCH: 27.9 pg (ref 26.0–34.0)
MCHC: 33.3 g/dL (ref 30.0–36.0)
MCV: 83.6 fL (ref 78.0–100.0)
Platelets: 229 10*3/uL (ref 150–400)
RBC: 3.91 MIL/uL (ref 3.87–5.11)
RDW: 13.7 % (ref 11.5–15.5)
WBC: 5.9 10*3/uL (ref 4.0–10.5)

## 2017-11-12 LAB — COMPREHENSIVE METABOLIC PANEL
ALBUMIN: 2.8 g/dL — AB (ref 3.5–5.0)
ALK PHOS: 205 U/L — AB (ref 38–126)
ALT: 8 U/L (ref 0–44)
ANION GAP: 9 (ref 5–15)
AST: 20 U/L (ref 15–41)
BUN: 8 mg/dL (ref 6–20)
CALCIUM: 8 mg/dL — AB (ref 8.9–10.3)
CO2: 19 mmol/L — AB (ref 22–32)
CREATININE: 1 mg/dL (ref 0.44–1.00)
Chloride: 107 mmol/L (ref 98–111)
GFR calc Af Amer: >60 mL/min (ref >60)
GFR calc non Af Amer: >60 mL/min (ref >60)
Glucose, Bld: 89 mg/dL (ref 70–99)
Potassium: 3.1 mmol/L — ABNORMAL LOW (ref 3.5–5.1)
SODIUM: 135 mmol/L (ref 135–145)
Total Bilirubin: 0.4 mg/dL (ref 0.3–1.2)
Total Protein: 6 g/dL — ABNORMAL LOW (ref 6.5–8.1)

## 2017-11-12 LAB — PROTEIN / CREATININE RATIO, URINE
Creatinine, Urine: 445 mg/dL
PROTEIN CREATININE RATIO: 0.07 mg/mg{creat} (ref 0.00–0.15)
Total Protein, Urine: 32 mg/dL

## 2017-11-12 LAB — GLUCOSE, RANDOM: GLUCOSE: 72 mg/dL (ref 70–99)

## 2017-11-12 MED ORDER — ASPIRIN 81 MG PO CHEW: 81.0000 mg | CHEWABLE_TABLET | Freq: Every day | ORAL | 1 refills | Status: DC

## 2017-11-12 NOTE — Progress Notes (Signed)
Subjective:  [name] Young is a 27 y.o. [id number] at 1436w1d being seen today for ongoing prenatal care.  She is currently monitored for the following issues for this high-risk pregnancy and has Vitamin D deficiency; Supervision of normal pregnancy, antepartum; and Status post repeat low transverse cesarean section on their problem list.  Patient reports hands and feet tight.  Contractions: Not present. Vag. Bleeding: None.  Movement: Present. Denies leaking of fluid.   The following portions of the patient's history were reviewed and updated as appropriate: allergies, current medications, past family history, past medical history, past social history, past surgical history and problem list. Problem list updated.  Objective:   Vitals:   11/12/17 0911  BP: (!) 155/102  Pulse: (!) 44  Weight: 158 lb 4.8 oz (71.8 kg)    Fetal Status:     Movement: Present     General:  Alert, oriented and cooperative. Patient is in no acute distress.  Skin: Skin is warm and dry. No rash noted.   Cardiovascular: Normal heart rate noted  Respiratory: Normal respiratory effort, no problems with respiration noted  Abdomen: Soft, gravid, appropriate for gestational age. Pain/Pressure: Absent     Pelvic:  Cervical exam deferred        Extremities: Normal range of motion.  Edema: Other (Comment)  Mental Status: Normal mood and affect. Normal behavior. Normal judgment and thought content.   Urinalysis:      Assessment and Plan:  Pregnancy: [id number] at 336w1d  1. Supervision of high risk pregnancy, antepartum, second trimester Rx: - CBC - Glucose Tolerance, 2 Hours w/1 Hour - RPR - HIV antibody - Obstetric Panel, Including HIV - SMN1 COPY NUMBER ANALYSIS (SMA Carrier Screen) - Cystic Fibrosis Mutation 97 - Cytology - PAP - Culture, OB Urine - Hemoglobinopathy evaluation  2. Vaginal discharge during pregnancy, antepartum Rx: - Cervicovaginal ancillary only  3. Hx of preeclampsia, prior pregnancy,  currently pregnant  4. Previous cesarean delivery severe pre-eclampsia affecting pregnancy, antepartum  5. History of preterm delivery, currently pregnant  6. Rh negative status during pregnancy in third trimester - Rhogam needed today   Preterm labor symptoms and general obstetric precautions including but not limited to vaginal bleeding, contractions, leaking of fluid and fetal movement were reviewed in detail with the patient. Please refer to After Visit Summary for other counseling recommendations.  Return in about 2 weeks (around 11/26/2017) for ROB.   Brock BadHarper, Kemontae Dunklee A, MD

## 2017-11-12 NOTE — MAU Note (Signed)
Pt sent from office with elevated b/p. Pt in office for first ob visit today.

## 2017-11-12 NOTE — Addendum Note (Signed)
Addended by: Tim LairLARK, LATOYA on: 11/12/2017 11:48 AM   Modules accepted: Orders

## 2017-11-12 NOTE — MAU Provider Note (Signed)
History     CSN: [id number]  Arrival date and time: 11/12/17 1001   First Provider Initiated Contact with Patient 11/12/17 1056      Chief Complaint  Patient presents with  . Hypertension   G4P2103 @30 .1 wks sent from office for elevated BP. Denies HA, visual disturbances, RUQ pain, SOB, and CP. Reports some LE edema over the last few days, mostly in the am and resolves during the day. Good FM. No VB, LOF, or ctx. Of note, she had severe preeclampsia in G1 and delivered via CS at 33 wks. She had her first PNV today.     OB History    Gravida  4   Para  3   Term  2   Preterm  1   AB  0   Living  3     SAB  0   TAB  0   Ectopic  0   Multiple  0   Live Births  3           Past Medical History:  Diagnosis Date  . Depression   . Hx MRSA infection   . Medical history non-contributory   . Pregnancy induced hypertension     Past Surgical History:  Procedure Laterality Date  . CESAREAN SECTION N/A 02/17/2014   Procedure: CESAREAN SECTION;  Surgeon: Tereso NewcomerUgonna A Anyanwu, MD;  Location: [facility];  Service: Obstetrics;  Laterality: N/A;  . CESAREAN SECTION N/A 01/08/2015   Procedure: CESAREAN SECTION;  Surgeon: Tereso NewcomerUgonna A Anyanwu, MD;  Location: [facility];  Service: Obstetrics;  Laterality: N/A;  . CESAREAN SECTION N/A 12/05/2016   Procedure: CESAREAN SECTION;  Surgeon: Levie HeritageStinson, Jacob J, DO;  [facility]: [facility];  Service: Obstetrics;  Laterality: N/A;    Family History  Problem Relation Age of Onset  . Hypertension Mother   . Hypertension Father     Social History   Tobacco Use  . Smoking status: Never Smoker  . Smokeless tobacco: Never Used  Substance Use Topics  . Alcohol use: No  . Drug use: No    Allergies: No Known Allergies  Medications Prior to Admission  Medication Sig Dispense Refill Last Dose  . aspirin EC 81 MG tablet Take 81 mg by mouth every 6 (six) hours as needed for mild pain.   11/11/2017 at 10pm  . Prenatal Vit-Fe Fumarate-FA  (PRENATAL MULTIVITAMIN) TABS tablet Take 1 tablet by mouth daily at 12 noon.   11/11/2017 at Unknown time  . buPROPion (WELLBUTRIN SR) 100 MG 12 hr tablet Take 1 tablet (100 mg total) by mouth 2 (two) times daily. (Patient not taking: Reported on 11/12/2017) 60 tablet 5 Not Taking at Unknown time  . ibuprofen (ADVIL,MOTRIN) 600 MG tablet Take 1 tablet (600 mg total) by mouth every 6 (six) hours as needed. (Patient not taking: Reported on 11/12/2017) 30 tablet 0 Not Taking at Unknown time  . oxyCODONE (OXY IR/ROXICODONE) 5 MG immediate release tablet Take 1 tablet (5 mg total) by mouth every 4 (four) hours as needed (pain scale 4-7). (Patient not taking: Reported on 11/12/2017) 50 tablet 0 Not Taking at Unknown time    Review of Systems  Eyes: Negative for visual disturbance.  Respiratory: Negative for shortness of breath.   Cardiovascular: Negative for chest pain.  Gastrointestinal: Negative for abdominal pain.  Genitourinary: Negative.   Neurological: Negative for headaches.   Physical Exam   Blood pressure 122/79, pulse (!) 45, temperature 98.4 F (36.9 C), temperature source Oral, resp. rate  15, height 5\' 3"  (1.6 m), weight 158 lb (71.7 kg), last menstrual period 04/15/2017, SpO2 99 %, unknown if currently breastfeeding.  Patient Vitals for the past 24 hrs:  BP Temp Temp src Pulse Resp SpO2 Height Weight  11/12/17 1246 122/79 - - (!) 45 - - - -  11/12/17 1231 121/71 - - (!) 45 - - - -  11/12/17 1215 115/71 - - (!) 43 - 99 % - -  11/12/17 1201 106/66 - - (!) 46 - - - -  11/12/17 1146 117/82 - - (!) 53 - - - -  11/12/17 1130 (!) 135/94 - - 64 - 100 % - -  11/12/17 1116 127/80 - - (!) 55 - - - -  11/12/17 1101 129/79 - - (!) 47 - - - -  11/12/17 1055 - - - - - 100 % - -  11/12/17 1050 - - - - - 100 % - -  11/12/17 1046 123/82 - - (!) 48 - - - -  11/12/17 1031 123/80 - - 67 - - - -  11/12/17 1017 127/85 98.4 F (36.9 C) Oral 68 15 100 % 5\' 3"  (1.6 m) 158 lb (71.7 kg)    Physical Exam   Constitutional: She is oriented to person, place, and time. She appears well-developed and well-nourished. No distress.  HENT:  Head: Normocephalic and atraumatic.  Neck: Normal range of motion.  Cardiovascular: Regular rhythm and normal heart sounds.  Bradycardia noted  Respiratory: Effort normal and breath sounds normal. No respiratory distress. She has no wheezes. She has no rales.  GI: Soft. She exhibits no distension. There is no tenderness.  gravid  Musculoskeletal: She exhibits no edema.  Neurological: She is alert and oriented to person, place, and time.  Skin: Skin is warm and dry.  Psychiatric: She has a normal mood and affect.  EFM: 140 bpm, mod variability, + accels, occ variable decels Toco: none  Results for orders placed or performed during the hospital encounter of 11/12/17 (from the past 24 hour(s))  CBC     Status: Abnormal   Collection Time: 11/12/17 10:32 AM  Result Value Ref Range   WBC 5.9 4.0 - 10.5 K/uL   RBC 3.91 3.87 - 5.11 MIL/uL   Hemoglobin 10.9 (L) 12.0 - 15.0 g/dL   HCT 16.1 (L) 09.6 - 04.5 %   MCV 83.6 78.0 - 100.0 fL   MCH 27.9 26.0 - 34.0 pg   MCHC 33.3 30.0 - 36.0 g/dL   RDW 40.9 81.1 - 91.4 %   Platelets 229 150 - 400 K/uL  Comprehensive metabolic panel     Status: Abnormal   Collection Time: 11/12/17 10:32 AM  Result Value Ref Range   Sodium 135 135 - 145 mmol/L   Potassium 3.1 (L) 3.5 - 5.1 mmol/L   Chloride 107 98 - 111 mmol/L   CO2 19 (L) 22 - 32 mmol/L   Glucose, Bld 89 70 - 99 mg/dL   BUN 8 6 - 20 mg/dL   Creatinine, Ser 7.82 0.44 - 1.00 mg/dL   Calcium 8.0 (L) 8.9 - 10.3 mg/dL   Total Protein 6.0 (L) 6.5 - 8.1 g/dL   Albumin 2.8 (L) 3.5 - 5.0 g/dL   AST 20 15 - 41 U/L   ALT 8 0 - 44 U/L   Alkaline Phosphatase 205 (H) 38 - 126 U/L   Total Bilirubin 0.4 0.3 - 1.2 mg/dL   GFR calc non Af Amer >60 >60 mL/min  GFR calc Af Amer >60 >60 mL/min   Anion gap 9 5 - 15  Protein / creatinine ratio, urine     Status: None   Collection  Time: 11/12/17 10:34 AM  Result Value Ref Range   Creatinine, Urine 445 mg/dL   Total Protein, Urine 32 mg/dL   Protein Creatinine Ratio 0.07 0.00 - 0.15 mg/mg[Cre]  Glucose, random     Status: None   Collection Time: 11/12/17 11:29 AM  Result Value Ref Range   Glucose, Bld 72 70 - 99 mg/dL   MAU Course  Procedures  MDM Labs ordered and reviewed. Intermittent episodes of bradycardia, pt asymptomatic. EKG interpreted by Dr. Alveria Apley brady, could be preexisting or vagal from advanced pregnancy, no CV f/u recommended. Only 1 BP elevation and not severe, no evidence of preeclampsia or gHTN today. Will start baby ASA and follow closely. Strict pre-e precautions. Stable for discharge home.   Assessment and Plan   1. [redacted] weeks gestation of pregnancy   2. NST (non-stress test) reactive   3. Hypertension affecting pregnancy in third trimester   4. History of pre-eclampsia   5. Bradycardia, sinus    Discharge home Follow up at [facility] in 4 days for BP check Pre-e precautions Rx baby ASA  Allergies as of 11/12/2017   No Known Allergies     Medication List    STOP taking these medications   aspirin EC 81 MG tablet Replaced by:  aspirin 81 MG chewable tablet   buPROPion 100 MG 12 hr tablet Commonly known as:  WELLBUTRIN SR   ibuprofen 600 MG tablet Commonly known as:  ADVIL,MOTRIN   oxyCODONE 5 MG immediate release tablet Commonly known as:  Oxy IR/ROXICODONE     TAKE these medications   aspirin 81 MG chewable tablet Chew 1 tablet (81 mg total) by mouth daily. Replaces:  aspirin EC 81 MG tablet   prenatal multivitamin Tabs tablet Take 1 tablet by mouth daily at 12 noon.      Donette Larry, CNM 11/12/2017, 1:09 PM

## 2017-11-12 NOTE — Discharge Instructions (Signed)

## 2017-11-12 NOTE — Progress Notes (Signed)
Pt c/o: hands and feet feeling tight, shaky , Pt has Hx of PIH. B/P is elevated today. Not on any medications currently. Pregnancy was not planned. Patient is married.  Patient and husband currently considering adoption.

## 2017-11-13 LAB — CERVICOVAGINAL ANCILLARY ONLY
BACTERIAL VAGINITIS: NEGATIVE
CHLAMYDIA, DNA PROBE: NEGATIVE
Candida vaginitis: NEGATIVE
NEISSERIA GONORRHEA: NEGATIVE
TRICH (WINDOWPATH): NEGATIVE

## 2017-11-13 LAB — CYTOLOGY - PAP
DIAGNOSIS: NEGATIVE
HPV: NOT DETECTED

## 2017-11-13 LAB — GLUCOSE, RANDOM: GLUCOSE: 61 mg/dL — AB (ref 65–99)

## 2017-11-15 ENCOUNTER — Ambulatory Visit (HOSPITAL_COMMUNITY)
Admission: RE | Admit: 2017-11-15 | Discharge: 2017-11-15 | Disposition: A | Discharge status: Home / Self Care | Payer: Medicaid Other | Source: Ambulatory Visit | Attending: Obstetrics | Admitting: Obstetrics

## 2017-11-15 ENCOUNTER — Other Ambulatory Visit: Payer: Self-pay | Admitting: Obstetrics

## 2017-11-15 DIAGNOSIS — O0992 Supervision of high risk pregnancy, unspecified, second trimester: Secondary | ICD-10-CM

## 2017-11-15 DIAGNOSIS — O34219 Maternal care for unspecified type scar from previous cesarean delivery: Secondary | ICD-10-CM

## 2017-11-15 DIAGNOSIS — O0933 Supervision of pregnancy with insufficient antenatal care, third trimester: Secondary | ICD-10-CM

## 2017-11-15 DIAGNOSIS — Z363 Encounter for antenatal screening for malformations: Secondary | ICD-10-CM | POA: Diagnosis not present

## 2017-11-15 DIAGNOSIS — Z3A30 30 weeks gestation of pregnancy: Secondary | ICD-10-CM

## 2017-11-15 DIAGNOSIS — O09213 Supervision of pregnancy with history of pre-term labor, third trimester: Secondary | ICD-10-CM | POA: Insufficient documentation

## 2017-11-15 DIAGNOSIS — O09293 Supervision of pregnancy with other poor reproductive or obstetric history, third trimester: Secondary | ICD-10-CM

## 2017-11-15 DIAGNOSIS — O09893 Supervision of other high risk pregnancies, third trimester: Secondary | ICD-10-CM

## 2017-11-15 LAB — CULTURE, OB URINE

## 2017-11-15 LAB — URINE CULTURE, OB REFLEX

## 2017-11-19 LAB — OBSTETRIC PANEL, INCLUDING HIV
ANTIBODY SCREEN: NEGATIVE
BASOS: 1 %
Basophils Absolute: 0.1 10*3/uL (ref 0.0–0.2)
EOS (ABSOLUTE): 0.1 10*3/uL (ref 0.0–0.4)
EOS: 2 %
HEMATOCRIT: 35 % (ref 34.0–46.6)
HEMOGLOBIN: 11.3 g/dL (ref 11.1–15.9)
HEP B S AG: NEGATIVE
HIV Screen 4th Generation wRfx: NONREACTIVE
IMMATURE GRANS (ABS): 0 10*3/uL (ref 0.0–0.1)
IMMATURE GRANULOCYTES: 0 %
LYMPHS: 37 %
Lymphocytes Absolute: 2.2 10*3/uL (ref 0.7–3.1)
MCH: 26.5 pg — ABNORMAL LOW (ref 26.6–33.0)
MCHC: 32.3 g/dL (ref 31.5–35.7)
MCV: 82 fL (ref 79–97)
MONOCYTES: 9 %
Monocytes Absolute: 0.5 10*3/uL (ref 0.1–0.9)
NEUTROS PCT: 51 %
Neutrophils Absolute: 3.1 10*3/uL (ref 1.4–7.0)
Platelets: 256 10*3/uL (ref 150–450)
RBC: 4.26 x10E6/uL (ref 3.77–5.28)
RDW: 13 % (ref 12.3–15.4)
RH TYPE: POSITIVE
RPR: NONREACTIVE
WBC: 5.9 10*3/uL (ref 3.4–10.8)

## 2017-11-19 LAB — CYSTIC FIBROSIS MUTATION 97: GENE DIS ANAL CARRIER INTERP BLD/T-IMP: NOT DETECTED

## 2017-11-19 LAB — SMN1 COPY NUMBER ANALYSIS (SMA CARRIER SCREENING)

## 2017-11-19 LAB — HEMOGLOBINOPATHY EVALUATION
HGB C: 0 %
HGB S: 0 %
HGB VARIANT: 0 %
Hemoglobin A2 Quantitation: 2.1 % (ref 1.8–3.2)
Hemoglobin F Quantitation: 0 % (ref 0.0–2.0)
Hgb A: 97.9 % (ref 96.4–98.8)

## 2017-11-26 ENCOUNTER — Ambulatory Visit (INDEPENDENT_AMBULATORY_CARE_PROVIDER_SITE_OTHER): Payer: Medicaid Other | Admitting: Obstetrics

## 2017-11-26 ENCOUNTER — Inpatient Hospital Stay (HOSPITAL_COMMUNITY)
Admission: AD | Admit: 2017-11-26 | Discharge: 2017-11-30 | DRG: 788 | Disposition: A | Discharge status: Home / Self Care | Payer: Medicaid Other | Source: Ambulatory Visit | Attending: Family Medicine | Admitting: Family Medicine

## 2017-11-26 ENCOUNTER — Encounter (HOSPITAL_COMMUNITY): Payer: Self-pay

## 2017-11-26 ENCOUNTER — Other Ambulatory Visit: Payer: Self-pay

## 2017-11-26 VITALS — BP 197/124 | HR 46 | Wt 158.6 lb

## 2017-11-26 DIAGNOSIS — O34219 Maternal care for unspecified type scar from previous cesarean delivery: Secondary | ICD-10-CM

## 2017-11-26 DIAGNOSIS — O34211 Maternal care for low transverse scar from previous cesarean delivery: Secondary | ICD-10-CM | POA: Diagnosis present

## 2017-11-26 DIAGNOSIS — Z7982 Long term (current) use of aspirin: Secondary | ICD-10-CM

## 2017-11-26 DIAGNOSIS — O09293 Supervision of pregnancy with other poor reproductive or obstetric history, third trimester: Secondary | ICD-10-CM

## 2017-11-26 DIAGNOSIS — O09299 Supervision of pregnancy with other poor reproductive or obstetric history, unspecified trimester: Secondary | ICD-10-CM

## 2017-11-26 DIAGNOSIS — Z8614 Personal history of Methicillin resistant Staphylococcus aureus infection: Secondary | ICD-10-CM

## 2017-11-26 DIAGNOSIS — O09899 Supervision of other high risk pregnancies, unspecified trimester: Secondary | ICD-10-CM

## 2017-11-26 DIAGNOSIS — O1414 Severe pre-eclampsia complicating childbirth: Secondary | ICD-10-CM | POA: Diagnosis not present

## 2017-11-26 DIAGNOSIS — Z3A32 32 weeks gestation of pregnancy: Secondary | ICD-10-CM

## 2017-11-26 DIAGNOSIS — O0990 Supervision of high risk pregnancy, unspecified, unspecified trimester: Secondary | ICD-10-CM

## 2017-11-26 DIAGNOSIS — O09213 Supervision of pregnancy with history of pre-term labor, third trimester: Secondary | ICD-10-CM | POA: Diagnosis not present

## 2017-11-26 DIAGNOSIS — O09219 Supervision of pregnancy with history of pre-term labor, unspecified trimester: Secondary | ICD-10-CM

## 2017-11-26 DIAGNOSIS — O0993 Supervision of high risk pregnancy, unspecified, third trimester: Secondary | ICD-10-CM | POA: Diagnosis not present

## 2017-11-26 DIAGNOSIS — Z8759 Personal history of other complications of pregnancy, childbirth and the puerperium: Secondary | ICD-10-CM | POA: Diagnosis present

## 2017-11-26 DIAGNOSIS — O26893 Other specified pregnancy related conditions, third trimester: Secondary | ICD-10-CM

## 2017-11-26 DIAGNOSIS — Z98891 History of uterine scar from previous surgery: Secondary | ICD-10-CM

## 2017-11-26 DIAGNOSIS — Z6791 Unspecified blood type, Rh negative: Secondary | ICD-10-CM

## 2017-11-26 DIAGNOSIS — O1413 Severe pre-eclampsia, third trimester: Secondary | ICD-10-CM | POA: Diagnosis not present

## 2017-11-26 DIAGNOSIS — O43813 Placental infarction, third trimester: Secondary | ICD-10-CM | POA: Diagnosis not present

## 2017-11-26 LAB — PROTEIN / CREATININE RATIO, URINE
CREATININE, URINE: 314 mg/dL
PROTEIN CREATININE RATIO: 4.72 mg/mg{creat} — AB (ref 0.00–0.15)
TOTAL PROTEIN, URINE: 1482 mg/dL

## 2017-11-26 LAB — URINALYSIS, ROUTINE W REFLEX MICROSCOPIC
BILIRUBIN URINE: NEGATIVE
Glucose, UA: NEGATIVE mg/dL
HGB URINE DIPSTICK: NEGATIVE
KETONES UR: NEGATIVE mg/dL
LEUKOCYTES UA: NEGATIVE
NITRITE: NEGATIVE
Specific Gravity, Urine: 1.025 (ref 1.005–1.030)
pH: 5 (ref 5.0–8.0)

## 2017-11-26 LAB — COMPREHENSIVE METABOLIC PANEL
ALT: 12 U/L (ref 0–44)
ANION GAP: 10 (ref 5–15)
AST: 27 U/L (ref 15–41)
Albumin: 2.9 g/dL — ABNORMAL LOW (ref 3.5–5.0)
Alkaline Phosphatase: 238 U/L — ABNORMAL HIGH (ref 38–126)
BUN: 15 mg/dL (ref 6–20)
CO2: 19 mmol/L — ABNORMAL LOW (ref 22–32)
Calcium: 9 mg/dL (ref 8.9–10.3)
Chloride: 107 mmol/L (ref 98–111)
Creatinine, Ser: 1.1 mg/dL — ABNORMAL HIGH (ref 0.44–1.00)
Glucose, Bld: 62 mg/dL — ABNORMAL LOW (ref 70–99)
POTASSIUM: 5.1 mmol/L (ref 3.5–5.1)
SODIUM: 136 mmol/L (ref 135–145)
TOTAL PROTEIN: 6.2 g/dL — AB (ref 6.5–8.1)
Total Bilirubin: 0.6 mg/dL (ref 0.3–1.2)

## 2017-11-26 LAB — CBC
HCT: 37.8 % (ref 36.0–46.0)
Hemoglobin: 12.5 g/dL (ref 12.0–15.0)
MCH: 27.5 pg (ref 26.0–34.0)
MCHC: 33.1 g/dL (ref 30.0–36.0)
MCV: 83.1 fL (ref 78.0–100.0)
PLATELETS: 237 10*3/uL (ref 150–400)
RBC: 4.55 MIL/uL (ref 3.87–5.11)
RDW: 14 % (ref 11.5–15.5)
WBC: 6.5 10*3/uL (ref 4.0–10.5)

## 2017-11-26 LAB — TYPE AND SCREEN
ABO/RH(D): B NEG
ANTIBODY SCREEN: NEGATIVE
Weak D: POSITIVE

## 2017-11-26 MED ORDER — CALCIUM CARBONATE ANTACID 500 MG PO CHEW: 2.0000 | CHEWABLE_TABLET | ORAL | Status: DC | PRN

## 2017-11-26 MED ORDER — DOCUSATE SODIUM 100 MG PO CAPS
100.0000 mg | ORAL_CAPSULE | Freq: Every day | ORAL | Status: DC
  Filled 2017-11-26 (×2): qty 1

## 2017-11-26 MED ORDER — BETAMETHASONE SOD PHOS & ACET 6 (3-3) MG/ML IJ SUSP
12.0000 mg | INTRAMUSCULAR | Status: AC
  Administered 2017-11-26 – 2017-11-27 (×2): 12 mg via INTRAMUSCULAR
  Filled 2017-11-26 (×2): qty 2

## 2017-11-26 MED ORDER — LABETALOL HCL 5 MG/ML IV SOLN: 40.0000 mg | Freq: Once | INTRAVENOUS | Status: DC | PRN

## 2017-11-26 MED ORDER — ZOLPIDEM TARTRATE 5 MG PO TABS
5.0000 mg | ORAL_TABLET | Freq: Every evening | ORAL | Status: DC | PRN
  Administered 2017-11-27: 5 mg via ORAL
  Filled 2017-11-26: qty 1

## 2017-11-26 MED ORDER — ASPIRIN 81 MG PO CHEW
81.0000 mg | CHEWABLE_TABLET | Freq: Every day | ORAL | Status: DC
  Administered 2017-11-26 – 2017-11-27 (×2): 81 mg via ORAL
  Filled 2017-11-26 (×3): qty 1

## 2017-11-26 MED ORDER — ACETAMINOPHEN 325 MG PO TABS: 650.0000 mg | ORAL_TABLET | ORAL | Status: DC | PRN

## 2017-11-26 MED ORDER — NIFEDIPINE 10 MG PO CAPS
10.0000 mg | ORAL_CAPSULE | ORAL | Status: AC | PRN
  Administered 2017-11-26: 10 mg via ORAL
  Administered 2017-11-27: 20 mg via ORAL
  Administered 2017-11-27: 10 mg via ORAL
  Filled 2017-11-26: qty 2
  Filled 2017-11-26 (×2): qty 1

## 2017-11-26 MED ORDER — PRENATAL MULTIVITAMIN CH
1.0000 | ORAL_TABLET | Freq: Every day | ORAL | Status: DC
  Administered 2017-11-26: 1 via ORAL
  Filled 2017-11-26 (×2): qty 1

## 2017-11-26 NOTE — Progress Notes (Signed)
Pt states that she has not felt fetal movement in about 24 hours. Pt blood pressure today is elevated. Pt states that her headache has subsided. Denies any other symptoms. Discussed with Dr. Clearance CootsHarper pt sent to MAU for evaluation.

## 2017-11-26 NOTE — MAU Provider Note (Signed)
History     CSN: [id number]  Arrival date and time: 11/26/17 0948   None     Chief Complaint  Patient presents with  . Hypertension   Hypertension  Pertinent negatives include no chest pain, headaches or shortness of breath.   [name] Young 27 y.o. [id number] at [redacted]w[redacted]d who presents to MAU from [facility] for evaluation of severe range BP. Patient has secondary concern for decreased fetal movement. Denies vaginal bleeding, leaking of fluid, fever, falls, or recent illness. Denies headache, RUQ pain, visual disturbances, or SOB.  History significiant for c/s x 3, including c/s at 33 weeks for Preeclampsia  Patient reports she has been compliant with recommendation for daily Aspirin 81mg . Is not on any other medication besides prenatal vitamin.   OB History    Gravida  4   Para  3   Term  2   Preterm  1   AB  0   Living  3     SAB  0   TAB  0   Ectopic  0   Multiple  0   Live Births  3           Past Medical History:  Diagnosis Date  . Depression   . Hx MRSA infection   . Pregnancy induced hypertension     Past Surgical History:  Procedure Laterality Date  . CESAREAN SECTION N/A 02/17/2014   Procedure: CESAREAN SECTION;  Surgeon: Tereso Newcomer, MD;  Location: [facility];  Service: Obstetrics;  Laterality: N/A;  . CESAREAN SECTION N/A 01/08/2015   Procedure: CESAREAN SECTION;  Surgeon: Tereso Newcomer, MD;  Location: [facility];  Service: Obstetrics;  Laterality: N/A;  . CESAREAN SECTION N/A 12/05/2016   Procedure: CESAREAN SECTION;  Surgeon: Levie Heritage, DO;  Location: [facility] BIRTHING SUITES;  Service: Obstetrics;  Laterality: N/A;    Family History  Problem Relation Age of Onset  . Hypertension Mother   . Hypertension Father     Social History   Tobacco Use  . Smoking status: Never Smoker  . Smokeless tobacco: Never Used  Substance Use Topics  . Alcohol use: No  . Drug use: No    Allergies: No Known Allergies  Medications Prior to Admission   Medication Sig Dispense Refill Last Dose  . aspirin 325 MG tablet Take 325 mg by mouth daily.   Past Week at Unknown time  . Prenatal Vit-Fe Fumarate-FA (PRENATAL MULTIVITAMIN) TABS tablet Take 1 tablet by mouth daily at 12 noon.   11/25/2017 at Unknown time  . aspirin 81 MG chewable tablet Chew 1 tablet (81 mg total) by mouth daily. (Patient not taking: Reported on 11/26/2017) 60 tablet 1 Not Taking at Unknown time    Review of Systems  Constitutional: Negative for fatigue and fever.  Eyes: Negative for photophobia and visual disturbance.  Respiratory: Negative for shortness of breath.   Cardiovascular: Negative for chest pain and leg swelling.  Gastrointestinal: Negative for abdominal pain, nausea and vomiting.  Genitourinary: Negative for vaginal bleeding, vaginal discharge and vaginal pain.  Neurological: Negative for dizziness, syncope, facial asymmetry and headaches.  All other systems reviewed and are negative.  Physical Exam   Blood pressure (!) 146/106, pulse (!) 59, temperature 98.1 F (36.7 C), temperature source Oral, resp. rate 18, weight 159 lb (72.1 kg), last menstrual period 04/15/2017, SpO2 99 %, unknown if currently breastfeeding.  Physical Exam  Nursing note and vitals reviewed. Constitutional: She is oriented to person, place, and time. She  appears well-developed and well-nourished.  HENT:  Head: Normocephalic.  Cardiovascular: Normal rate, regular rhythm, normal heart sounds and intact distal pulses.  Respiratory: Effort normal and breath sounds normal.  GI:  Gravid  Neurological: She is alert and oriented to person, place, and time. She has normal reflexes.  Skin: Skin is warm and dry.  Psychiatric: She has a normal mood and affect. Her behavior is normal. Judgment and thought content normal.    MAU Course  Procedures  MDM  Reactive NST: Baseline 135, moderate variability, positive accelerations, no decelerations Contractions: mild, irregular, q 3-7  minutes   Patient Vitals for the past 24 hrs:  BP Temp Temp src Pulse Resp SpO2 Weight  11/26/17 1055 - - - - - 99 % -  11/26/17 1050 - - - - - 100 % -  11/26/17 1047 (!) 146/106 - - (!) 59 - - -  11/26/17 1045 - - - - - 100 % -  11/26/17 1044 - - - - - 100 % -  11/26/17 1040 - - - - - 100 % -  11/26/17 1031 (!) 164/106 - - (!) 56 - 100 % -  11/26/17 1025 - - - - - 100 % -  11/26/17 1020 - - - - - 100 % -  11/26/17 1016 (!) 166/130 - - 73 - - -  11/26/17 1013 (!) 172/130 - - 67 - - -  11/26/17 0958 (!) 196/116 98.1 F (36.7 C) Oral (!) 46 18 - 159 lb (72.1 kg)  11/26/17 0957 - - - - - 100 % -    Orders Placed This Encounter  Procedures  . Urinalysis, Routine w reflex microscopic  . CBC  . Comprehensive metabolic panel  . Protein / creatinine ratio, urine  . Vital signs  . Notify Physician  . Check blood pressure 10 minutes after giving labetalol 40 mg IV dose. Call MD if SBP >/= 160 and/or DBP >/= 110.  . Once BP goal is reached, repeat BP every 10 minutes for 1 hour, then every 15 minutes for 1 hours, then per policy for antepartum labor or post-partum.   Results for orders placed or performed during the hospital encounter of 11/26/17 (from the past 24 hour(s))  Urinalysis, Routine w reflex microscopic     Status: Abnormal   Collection Time: 11/26/17 10:01 AM  Result Value Ref Range   Color, Urine YELLOW YELLOW   APPearance HAZY (A) CLEAR   Specific Gravity, Urine 1.025 1.005 - 1.030   pH 5.0 5.0 - 8.0   Glucose, UA NEGATIVE NEGATIVE mg/dL   Hgb urine dipstick NEGATIVE NEGATIVE   Bilirubin Urine NEGATIVE NEGATIVE   Ketones, ur NEGATIVE NEGATIVE mg/dL   Protein, ur >=045>=300 (A) NEGATIVE mg/dL   Nitrite NEGATIVE NEGATIVE   Leukocytes, UA NEGATIVE NEGATIVE   RBC / HPF 0-5 0 - 5 RBC/hpf   WBC, UA 6-10 0 - 5 WBC/hpf   Bacteria, UA RARE (A) NONE SEEN   Squamous Epithelial / LPF 0-5 0 - 5   Mucus PRESENT   Protein / creatinine ratio, urine     Status: Abnormal   Collection  Time: 11/26/17 10:02 AM  Result Value Ref Range   Creatinine, Urine 314.00 mg/dL   Total Protein, Urine 1,482 mg/dL   Protein Creatinine Ratio 4.72 (H) 0.00 - 0.15 mg/mg[Cre]  CBC     Status: None   Collection Time: 11/26/17 10:19 AM  Result Value Ref Range   WBC 6.5 4.0 -  10.5 K/uL   RBC 4.55 3.87 - 5.11 MIL/uL   Hemoglobin 12.5 12.0 - 15.0 g/dL   HCT 60.4 54.0 - 98.1 %   MCV 83.1 78.0 - 100.0 fL   MCH 27.5 26.0 - 34.0 pg   MCHC 33.1 30.0 - 36.0 g/dL   RDW 19.1 47.8 - 29.5 %   Platelets 237 150 - 400 K/uL  Comprehensive metabolic panel     Status: Abnormal   Collection Time: 11/26/17 10:19 AM  Result Value Ref Range   Sodium 136 135 - 145 mmol/L   Potassium 5.1 3.5 - 5.1 mmol/L   Chloride 107 98 - 111 mmol/L   CO2 19 (L) 22 - 32 mmol/L   Glucose, Bld 62 (L) 70 - 99 mg/dL   BUN 15 6 - 20 mg/dL   Creatinine, Ser 6.21 (H) 0.44 - 1.00 mg/dL   Calcium 9.0 8.9 - 30.8 mg/dL   Total Protein 6.2 (L) 6.5 - 8.1 g/dL   Albumin 2.9 (L) 3.5 - 5.0 g/dL   AST 27 15 - 41 U/L   ALT 12 0 - 44 U/L   Alkaline Phosphatase 238 (H) 38 - 126 U/L   Total Bilirubin 0.6 0.3 - 1.2 mg/dL   GFR calc non Af Amer >60 >60 mL/min   GFR calc Af Amer >60 >60 mL/min   Anion gap 10 5 - 15     Assessment and Plan  --Plan of care discussed with Dr. Adrian Blackwater --Admit to Antepartum for further evaluation  Calvert Cantor, CNM 11/26/2017, 11:39 AM

## 2017-11-26 NOTE — MAU Note (Signed)
Pt had Pre-E with previous pregnancy. Is only on aspirin for BP this pregnancy. No current headache, No fetal movement since last night even with efforts from mom. No visual changes or RUQ pain. HTN currently and bradycardia.

## 2017-11-26 NOTE — Progress Notes (Signed)
I reviewed the patient's vital signs and agree with the nursing assessment.   Assessment and Plan:  1. Supervision of high risk pregnancy, antepartum  2. Hx of preeclampsia, prior pregnancy, currently pregnant  3. History of preterm delivery, currently pregnant  4. Previous cesarean delivery affecting pregnancy, antepartum  5. Rh negative status during pregnancy in third trimester  Patent also had decreased fetal movement, [facility] for severe range pre-eclampsia BP's.   Brock BadHarper, Charles A, MD 11/26/2017 10:02 AM

## 2017-11-26 NOTE — H&P (Signed)
[name] Young is a 27 y.o. female presenting for Preeclampsia evaluation after elevated pressures at [facility] this morning. Denies vaginal bleeding, leaking of fluid, fever, falls, or recent illness.  Denies headache, visual disturbances, RUQ pain.  Pregnancy is complicated by History of pre-eclampsia, Vitamin D deficiency; Supervision of normal pregnancy, antepartum; Status post repeat low transverse cesarean section; and History of cesarean delivery on their problem list.  OB History    Gravida  4   Para  3   Term  2   Preterm  1   AB  0   Living  3     SAB  0   TAB  0   Ectopic  0   Multiple  0   Live Births  3          Past Medical History:  Diagnosis Date  . Depression   . Hx MRSA infection   . Pregnancy induced hypertension    Past Surgical History:  Procedure Laterality Date  . CESAREAN SECTION N/A 02/17/2014   Procedure: CESAREAN SECTION;  Surgeon: Tereso NewcomerUgonna A Anyanwu, MD;  Location: [facility];  Service: Obstetrics;  Laterality: N/A;  . CESAREAN SECTION N/A 01/08/2015   Procedure: CESAREAN SECTION;  Surgeon: Tereso NewcomerUgonna A Anyanwu, MD;  Location: [facility];  Service: Obstetrics;  Laterality: N/A;  . CESAREAN SECTION N/A 12/05/2016   Procedure: CESAREAN SECTION;  Surgeon: Levie HeritageStinson, Jacob J, DO;  [facility];  Service: Obstetrics;  Laterality: N/A;   Family History: family history includes Hypertension in her father and mother. Social History:  reports that she has never smoked. She has never used smokeless tobacco. She reports that she does not drink alcohol or use drugs.     Maternal Diabetes: No Genetic Screening: Normal Maternal Ultrasounds/Referrals: Normal Fetal Ultrasounds or other Referrals:  None Maternal Substance Abuse:  No Significant Maternal Medications:  None Significant Maternal Lab Results:  None Other Comments:  None  Review of Systems  Constitutional: Negative for fever.  Respiratory: Negative for shortness of breath.    Cardiovascular: Negative for chest pain.  Gastrointestinal: Negative for abdominal pain, diarrhea and vomiting.  Neurological: Negative for dizziness, weakness and headaches.  All other systems reviewed and are negative.  History   Blood pressure (!) 132/97, pulse 77, temperature 98.1 F (36.7 C), temperature source Oral, resp. rate 18, weight 159 lb (72.1 kg), last menstrual period 04/15/2017, SpO2 100 %, unknown if currently breastfeeding. Exam Physical Exam  Nursing note and vitals reviewed. Constitutional: She is oriented to person, place, and time. She appears well-developed and well-nourished.  Cardiovascular: Normal rate, regular rhythm, normal heart sounds and intact distal pulses.  Respiratory: Effort normal and breath sounds normal.  GI:  Gravid  Genitourinary: Vagina normal and uterus normal.  Neurological: She is alert and oriented to person, place, and time. She has normal reflexes.  Skin: Skin is warm and dry.  Psychiatric: She has a normal mood and affect. Her behavior is normal. Judgment and thought content normal.    Prenatal labs: ABO, Rh: B/Positive/-- (07/15 0916) Antibody: Negative (07/15 0916) Rubella: <0.90 (07/15 0916) RPR: Non Reactive (07/15 0916)  HBsAg: Negative (07/15 0916)  HIV: Non Reactive (07/15 0916)  GBS:   Unknown  Assessment/Plan: --[id number] at 6437w1d  --Preeclampsia in third trimester --Admit to Antepartum, Dr. Adrian BlackwaterStinson at bedside to discuss plan of care  Girl/breast/possible interval BTL (desires inpatient, has not signed papers)  Calvert CantorSamantha C Billie Trager, CNM 11/26/2017, 11:45 AM

## 2017-11-27 ENCOUNTER — Inpatient Hospital Stay (HOSPITAL_COMMUNITY): Payer: Medicaid Other | Admitting: Certified Registered Nurse Anesthetist

## 2017-11-27 ENCOUNTER — Encounter (HOSPITAL_COMMUNITY): Payer: Self-pay | Admitting: Anesthesiology

## 2017-11-27 ENCOUNTER — Encounter (HOSPITAL_COMMUNITY)
Admission: AD | Disposition: A | Discharge status: Home / Self Care | Payer: Self-pay | Source: Ambulatory Visit | Attending: Family Medicine

## 2017-11-27 DIAGNOSIS — O1414 Severe pre-eclampsia complicating childbirth: Secondary | ICD-10-CM

## 2017-11-27 DIAGNOSIS — O1413 Severe pre-eclampsia, third trimester: Secondary | ICD-10-CM | POA: Diagnosis present

## 2017-11-27 DIAGNOSIS — Z8759 Personal history of other complications of pregnancy, childbirth and the puerperium: Secondary | ICD-10-CM | POA: Diagnosis present

## 2017-11-27 DIAGNOSIS — Z3A32 32 weeks gestation of pregnancy: Secondary | ICD-10-CM

## 2017-11-27 DIAGNOSIS — O34211 Maternal care for low transverse scar from previous cesarean delivery: Secondary | ICD-10-CM

## 2017-11-27 LAB — CBC
HCT: 39.4 % (ref 36.0–46.0)
HCT: 37.3 % (ref 36.0–46.0)
HEMOGLOBIN: 13.1 g/dL (ref 12.0–15.0)
Hemoglobin: 12.4 g/dL (ref 12.0–15.0)
MCH: 27.4 pg (ref 26.0–34.0)
MCH: 27.7 pg (ref 26.0–34.0)
MCHC: 33.2 g/dL (ref 30.0–36.0)
MCHC: 33.2 g/dL (ref 30.0–36.0)
MCV: 82.4 fL (ref 78.0–100.0)
MCV: 83.3 fL (ref 78.0–100.0)
PLATELETS: 253 10*3/uL (ref 150–400)
PLATELETS: 243 10*3/uL (ref 150–400)
RBC: 4.78 MIL/uL (ref 3.87–5.11)
RBC: 4.48 MIL/uL (ref 3.87–5.11)
RDW: 13.9 % (ref 11.5–15.5)
RDW: 14 % (ref 11.5–15.5)
WBC: 9.5 10*3/uL (ref 4.0–10.5)
WBC: 9.2 10*3/uL (ref 4.0–10.5)

## 2017-11-27 LAB — COMPREHENSIVE METABOLIC PANEL
ALK PHOS: 218 U/L — AB (ref 38–126)
ALT: 11 U/L (ref 0–44)
AST: 25 U/L (ref 15–41)
Albumin: 2.7 g/dL — ABNORMAL LOW (ref 3.5–5.0)
Anion gap: 12 (ref 5–15)
BUN: 17 mg/dL (ref 6–20)
CALCIUM: 8.9 mg/dL (ref 8.9–10.3)
CO2: 19 mmol/L — AB (ref 22–32)
CREATININE: 1.16 mg/dL — AB (ref 0.44–1.00)
Chloride: 104 mmol/L (ref 98–111)
GFR calc non Af Amer: >60 mL/min (ref >60)
Glucose, Bld: 95 mg/dL (ref 70–99)
Potassium: 4.4 mmol/L (ref 3.5–5.1)
Sodium: 135 mmol/L (ref 135–145)
Total Bilirubin: 0.6 mg/dL (ref 0.3–1.2)
Total Protein: 5.9 g/dL — ABNORMAL LOW (ref 6.5–8.1)

## 2017-11-27 LAB — MAGNESIUM: MAGNESIUM: 6.6 mg/dL — AB (ref 1.7–2.4)

## 2017-11-27 LAB — CREATININE, SERUM
Creatinine, Ser: 1.18 mg/dL — ABNORMAL HIGH (ref 0.44–1.00)
GFR calc non Af Amer: >60 mL/min (ref >60)

## 2017-11-27 SURGERY — Surgical Case
Anesthesia: Spinal

## 2017-11-27 MED ORDER — SODIUM CHLORIDE 0.9% FLUSH: 3.0000 mL | INTRAVENOUS | Status: DC | PRN

## 2017-11-27 MED ORDER — PROMETHAZINE HCL 25 MG/ML IJ SOLN: 6.2500 mg | INTRAMUSCULAR | Status: DC | PRN

## 2017-11-27 MED ORDER — KETOROLAC TROMETHAMINE 30 MG/ML IJ SOLN
30.0000 mg | Freq: Four times a day (QID) | INTRAMUSCULAR | Status: AC | PRN
  Administered 2017-11-27: 30 mg via INTRAMUSCULAR

## 2017-11-27 MED ORDER — GLYCOPYRROLATE 0.2 MG/ML IJ SOLN
INTRAMUSCULAR | Status: DC | PRN
  Administered 2017-11-27: 0.2 mg via INTRAVENOUS

## 2017-11-27 MED ORDER — MAGNESIUM SULFATE 40 G IN LACTATED RINGERS - SIMPLE
2.0000 g/h | INTRAVENOUS | Status: AC
  Administered 2017-11-28: 2 g/h via INTRAVENOUS
  Filled 2017-11-27: qty 40

## 2017-11-27 MED ORDER — DIPHENHYDRAMINE HCL 25 MG PO CAPS: 25.0000 mg | ORAL_CAPSULE | Freq: Four times a day (QID) | ORAL | Status: DC | PRN

## 2017-11-27 MED ORDER — NALOXONE HCL 0.4 MG/ML IJ SOLN: 0.4000 mg | INTRAMUSCULAR | Status: DC | PRN

## 2017-11-27 MED ORDER — LABETALOL HCL 5 MG/ML IV SOLN
INTRAVENOUS | Status: AC
  Filled 2017-11-27: qty 4

## 2017-11-27 MED ORDER — MAGNESIUM SULFATE 40 G IN LACTATED RINGERS - SIMPLE
2.0000 g/h | INTRAVENOUS | Status: DC
  Administered 2017-11-27: 2 g/h via INTRAVENOUS
  Filled 2017-11-27: qty 40

## 2017-11-27 MED ORDER — OXYCODONE-ACETAMINOPHEN 5-325 MG PO TABS
2.0000 | ORAL_TABLET | ORAL | Status: DC | PRN
  Administered 2017-11-28: 1 via ORAL
  Administered 2017-11-28: 2 via ORAL
  Filled 2017-11-27 (×2): qty 2

## 2017-11-27 MED ORDER — HYDROMORPHONE HCL 1 MG/ML IJ SOLN: 0.2500 mg | INTRAMUSCULAR | Status: DC | PRN

## 2017-11-27 MED ORDER — PRENATAL MULTIVITAMIN CH
1.0000 | ORAL_TABLET | Freq: Every day | ORAL | Status: DC
  Administered 2017-11-28 – 2017-11-29 (×2): 1 via ORAL
  Filled 2017-11-27 (×2): qty 1

## 2017-11-27 MED ORDER — MORPHINE SULFATE (PF) 0.5 MG/ML IJ SOLN
INTRAMUSCULAR | Status: AC
  Filled 2017-11-27: qty 10

## 2017-11-27 MED ORDER — MORPHINE SULFATE (PF) 0.5 MG/ML IJ SOLN
INTRAMUSCULAR | Status: DC | PRN
  Administered 2017-11-27: .2 mg via INTRATHECAL

## 2017-11-27 MED ORDER — FENTANYL CITRATE (PF) 100 MCG/2ML IJ SOLN
INTRAMUSCULAR | Status: DC | PRN
  Administered 2017-11-27: 20 ug via INTRATHECAL

## 2017-11-27 MED ORDER — DIPHENHYDRAMINE HCL 25 MG PO CAPS
25.0000 mg | ORAL_CAPSULE | ORAL | Status: DC | PRN
  Administered 2017-11-28: 25 mg via ORAL
  Filled 2017-11-27 (×2): qty 1

## 2017-11-27 MED ORDER — ONDANSETRON HCL 4 MG/2ML IJ SOLN
INTRAMUSCULAR | Status: DC | PRN
  Administered 2017-11-27: 4 mg via INTRAVENOUS

## 2017-11-27 MED ORDER — DIBUCAINE 1 % RE OINT: 1.0000 "application " | TOPICAL_OINTMENT | RECTAL | Status: DC | PRN

## 2017-11-27 MED ORDER — TETANUS-DIPHTH-ACELL PERTUSSIS 5-2.5-18.5 LF-MCG/0.5 IM SUSP: 0.5000 mL | Freq: Once | INTRAMUSCULAR | Status: DC

## 2017-11-27 MED ORDER — ONDANSETRON HCL 4 MG/2ML IJ SOLN
INTRAMUSCULAR | Status: AC
  Filled 2017-11-27: qty 2

## 2017-11-27 MED ORDER — PHENYLEPHRINE 8 MG IN D5W 100 ML (0.08MG/ML) PREMIX OPTIME
INJECTION | INTRAVENOUS | Status: AC
  Filled 2017-11-27: qty 100

## 2017-11-27 MED ORDER — LACTATED RINGERS IV SOLN
INTRAVENOUS | Status: DC
  Administered 2017-11-28: 02:00:00 via INTRAVENOUS

## 2017-11-27 MED ORDER — SIMETHICONE 80 MG PO CHEW
80.0000 mg | CHEWABLE_TABLET | ORAL | Status: DC
  Administered 2017-11-27 – 2017-11-30 (×2): 80 mg via ORAL
  Filled 2017-11-27 (×3): qty 1

## 2017-11-27 MED ORDER — MEPERIDINE HCL 25 MG/ML IJ SOLN: 6.2500 mg | INTRAMUSCULAR | Status: DC | PRN

## 2017-11-27 MED ORDER — SCOPOLAMINE 1 MG/3DAYS TD PT72
MEDICATED_PATCH | TRANSDERMAL | Status: AC
  Filled 2017-11-27: qty 1

## 2017-11-27 MED ORDER — KETOROLAC TROMETHAMINE 30 MG/ML IJ SOLN: 30.0000 mg | Freq: Once | INTRAMUSCULAR | Status: DC | PRN

## 2017-11-27 MED ORDER — KETOROLAC TROMETHAMINE 30 MG/ML IJ SOLN: 30.0000 mg | Freq: Four times a day (QID) | INTRAMUSCULAR | Status: AC | PRN

## 2017-11-27 MED ORDER — NALBUPHINE HCL 10 MG/ML IJ SOLN: 5.0000 mg | Freq: Once | INTRAMUSCULAR | Status: DC | PRN

## 2017-11-27 MED ORDER — OXYCODONE-ACETAMINOPHEN 5-325 MG PO TABS
1.0000 | ORAL_TABLET | ORAL | Status: DC | PRN
  Administered 2017-11-28 – 2017-11-30 (×6): 1 via ORAL
  Filled 2017-11-27 (×6): qty 1

## 2017-11-27 MED ORDER — SCOPOLAMINE 1 MG/3DAYS TD PT72: 1.0000 | MEDICATED_PATCH | Freq: Once | TRANSDERMAL | Status: DC

## 2017-11-27 MED ORDER — PHENYLEPHRINE 8 MG IN D5W 100 ML (0.08MG/ML) PREMIX OPTIME
INJECTION | INTRAVENOUS | Status: DC | PRN
  Administered 2017-11-27: 60 ug/min via INTRAVENOUS

## 2017-11-27 MED ORDER — ACETAMINOPHEN 325 MG PO TABS
650.0000 mg | ORAL_TABLET | ORAL | Status: DC | PRN
  Administered 2017-11-30: 650 mg via ORAL
  Filled 2017-11-27: qty 2

## 2017-11-27 MED ORDER — FENTANYL CITRATE (PF) 100 MCG/2ML IJ SOLN
INTRAMUSCULAR | Status: AC
  Filled 2017-11-27: qty 2

## 2017-11-27 MED ORDER — LACTATED RINGERS IV SOLN
INTRAVENOUS | Status: DC
  Administered 2017-11-27 (×3): via INTRAVENOUS

## 2017-11-27 MED ORDER — IBUPROFEN 600 MG PO TABS
600.0000 mg | ORAL_TABLET | Freq: Four times a day (QID) | ORAL | Status: DC
  Administered 2017-11-27 – 2017-11-30 (×10): 600 mg via ORAL
  Filled 2017-11-27 (×10): qty 1

## 2017-11-27 MED ORDER — ONDANSETRON HCL 4 MG/2ML IJ SOLN
4.0000 mg | Freq: Three times a day (TID) | INTRAMUSCULAR | Status: DC | PRN
  Administered 2017-11-27: 4 mg via INTRAVENOUS
  Filled 2017-11-27: qty 2

## 2017-11-27 MED ORDER — CEFAZOLIN SODIUM-DEXTROSE 2-4 GM/100ML-% IV SOLN: 2.0000 g | Freq: Three times a day (TID) | INTRAVENOUS | Status: DC

## 2017-11-27 MED ORDER — MAGNESIUM SULFATE BOLUS VIA INFUSION
4.0000 g | Freq: Once | INTRAVENOUS | Status: AC
  Administered 2017-11-27: 4 g via INTRAVENOUS
  Filled 2017-11-27: qty 500

## 2017-11-27 MED ORDER — NALOXONE HCL 4 MG/10ML IJ SOLN
1.0000 ug/kg/h | INTRAVENOUS | Status: DC | PRN
  Filled 2017-11-27: qty 5

## 2017-11-27 MED ORDER — SODIUM CHLORIDE 0.9 % IR SOLN
Status: DC | PRN
  Administered 2017-11-27: 1

## 2017-11-27 MED ORDER — LABETALOL HCL 5 MG/ML IV SOLN
20.0000 mg | INTRAVENOUS | Status: DC | PRN
  Administered 2017-11-27: 20 mg via INTRAVENOUS

## 2017-11-27 MED ORDER — ENOXAPARIN SODIUM 40 MG/0.4ML ~~LOC~~ SOLN
40.0000 mg | SUBCUTANEOUS | Status: DC
  Administered 2017-11-28 – 2017-11-29 (×2): 40 mg via SUBCUTANEOUS
  Filled 2017-11-27 (×2): qty 0.4

## 2017-11-27 MED ORDER — LACTATED RINGERS IV BOLUS
500.0000 mL | Freq: Once | INTRAVENOUS | Status: AC
  Administered 2017-11-27: 500 mL via INTRAVENOUS

## 2017-11-27 MED ORDER — WITCH HAZEL-GLYCERIN EX PADS: 1.0000 "application " | MEDICATED_PAD | CUTANEOUS | Status: DC | PRN

## 2017-11-27 MED ORDER — NALBUPHINE HCL 10 MG/ML IJ SOLN: 5.0000 mg | INTRAMUSCULAR | Status: DC | PRN

## 2017-11-27 MED ORDER — LACTATED RINGERS IV SOLN
INTRAVENOUS | Status: DC | PRN
  Administered 2017-11-27: 13:00:00 via INTRAVENOUS

## 2017-11-27 MED ORDER — DEXAMETHASONE SODIUM PHOSPHATE 4 MG/ML IJ SOLN
INTRAMUSCULAR | Status: DC | PRN
  Administered 2017-11-27: 10 mg via INTRAVENOUS

## 2017-11-27 MED ORDER — OXYTOCIN 40 UNITS IN LACTATED RINGERS INFUSION - SIMPLE MED: 2.5000 [IU]/h | INTRAVENOUS | Status: AC

## 2017-11-27 MED ORDER — SENNOSIDES-DOCUSATE SODIUM 8.6-50 MG PO TABS
2.0000 | ORAL_TABLET | ORAL | Status: DC
  Administered 2017-11-27 – 2017-11-30 (×3): 2 via ORAL
  Filled 2017-11-27 (×3): qty 2

## 2017-11-27 MED ORDER — ACETAMINOPHEN 500 MG PO TABS
1000.0000 mg | ORAL_TABLET | Freq: Four times a day (QID) | ORAL | Status: AC
  Administered 2017-11-27 – 2017-11-28 (×3): 1000 mg via ORAL
  Filled 2017-11-27 (×3): qty 2

## 2017-11-27 MED ORDER — DIPHENHYDRAMINE HCL 50 MG/ML IJ SOLN
12.5000 mg | INTRAMUSCULAR | Status: DC | PRN
  Administered 2017-11-28 (×2): 12.5 mg via INTRAVENOUS
  Filled 2017-11-27: qty 1

## 2017-11-27 MED ORDER — SIMETHICONE 80 MG PO CHEW
80.0000 mg | CHEWABLE_TABLET | Freq: Three times a day (TID) | ORAL | Status: DC
  Administered 2017-11-27 – 2017-11-30 (×7): 80 mg via ORAL
  Filled 2017-11-27 (×6): qty 1

## 2017-11-27 MED ORDER — OXYTOCIN 10 UNIT/ML IJ SOLN
INTRAMUSCULAR | Status: DC | PRN
  Administered 2017-11-27: 40 [IU] via INTRAVENOUS

## 2017-11-27 MED ORDER — CEFAZOLIN SODIUM-DEXTROSE 1-4 GM/50ML-% IV SOLN
INTRAVENOUS | Status: DC | PRN
  Administered 2017-11-27: 2 g via INTRAVENOUS

## 2017-11-27 MED ORDER — SOD CITRATE-CITRIC ACID 500-334 MG/5ML PO SOLN
30.0000 mL | Freq: Once | ORAL | Status: AC
Start: 2017-11-27 — End: 2017-11-27
  Administered 2017-11-27: 30 mL via ORAL
  Filled 2017-11-27: qty 15

## 2017-11-27 MED ORDER — MENTHOL 3 MG MT LOZG: 1.0000 | LOZENGE | OROMUCOSAL | Status: DC | PRN

## 2017-11-27 MED ORDER — OXYTOCIN 10 UNIT/ML IJ SOLN
INTRAMUSCULAR | Status: AC
  Filled 2017-11-27: qty 4

## 2017-11-27 MED ORDER — HYDRALAZINE HCL 20 MG/ML IJ SOLN: 10.0000 mg | Freq: Once | INTRAMUSCULAR | Status: DC | PRN

## 2017-11-27 MED ORDER — ZOLPIDEM TARTRATE 5 MG PO TABS
5.0000 mg | ORAL_TABLET | Freq: Every evening | ORAL | Status: DC | PRN
  Administered 2017-11-29: 5 mg via ORAL
  Filled 2017-11-27: qty 1

## 2017-11-27 MED ORDER — KETOROLAC TROMETHAMINE 30 MG/ML IJ SOLN
INTRAMUSCULAR | Status: AC
  Filled 2017-11-27: qty 1

## 2017-11-27 MED ORDER — BUPIVACAINE IN DEXTROSE 0.75-8.25 % IT SOLN
INTRATHECAL | Status: DC | PRN
  Administered 2017-11-27: 1.4 mL via INTRATHECAL

## 2017-11-27 MED ORDER — CEFAZOLIN SODIUM-DEXTROSE 2-4 GM/100ML-% IV SOLN
2.0000 g | INTRAVENOUS | Status: DC
  Filled 2017-11-27: qty 100

## 2017-11-27 MED ORDER — COCONUT OIL OIL: 1.0000 "application " | TOPICAL_OIL | Status: DC | PRN

## 2017-11-27 MED ORDER — SCOPOLAMINE 1 MG/3DAYS TD PT72
MEDICATED_PATCH | TRANSDERMAL | Status: DC | PRN
  Administered 2017-11-27: 1 via TRANSDERMAL

## 2017-11-27 MED ORDER — SIMETHICONE 80 MG PO CHEW: 80.0000 mg | CHEWABLE_TABLET | ORAL | Status: DC | PRN

## 2017-11-27 SURGICAL SUPPLY — 32 items
BENZOIN TINCTURE PRP APPL 2/3 (GAUZE/BANDAGES/DRESSINGS) ×3 IMPLANT
CHLORAPREP W/TINT 26ML (MISCELLANEOUS) ×3 IMPLANT
CLAMP CORD UMBIL (MISCELLANEOUS) IMPLANT
CLOSURE WOUND 1/2 X4 (GAUZE/BANDAGES/DRESSINGS) ×1
CLOTH BEACON ORANGE TIMEOUT ST (SAFETY) ×3 IMPLANT
DRSG OPSITE POSTOP 4X10 (GAUZE/BANDAGES/DRESSINGS) ×3 IMPLANT
ELECT REM PT RETURN 9FT ADLT (ELECTROSURGICAL) ×3
ELECTRODE REM PT RTRN 9FT ADLT (ELECTROSURGICAL) ×1 IMPLANT
EXTRACTOR VACUUM M CUP 4 TUBE (SUCTIONS) IMPLANT
EXTRACTOR VACUUM M CUP 4' TUBE (SUCTIONS)
GLOVE BIOGEL PI IND STRL 7.0 (GLOVE) ×2 IMPLANT
GLOVE BIOGEL PI IND STRL 7.5 (GLOVE) ×2 IMPLANT
GLOVE BIOGEL PI INDICATOR 7.0 (GLOVE) ×4
GLOVE BIOGEL PI INDICATOR 7.5 (GLOVE) ×4
GLOVE ECLIPSE 7.5 STRL STRAW (GLOVE) ×3 IMPLANT
GOWN STRL REUS W/TWL LRG LVL3 (GOWN DISPOSABLE) ×9 IMPLANT
KIT ABG SYR 3ML LUER SLIP (SYRINGE) IMPLANT
NEEDLE HYPO 25X5/8 SAFETYGLIDE (NEEDLE) IMPLANT
NS IRRIG 1000ML POUR BTL (IV SOLUTION) ×3 IMPLANT
PACK C SECTION WH (CUSTOM PROCEDURE TRAY) ×3 IMPLANT
PAD ABD 7.5X8 STRL (GAUZE/BANDAGES/DRESSINGS) ×6 IMPLANT
PAD OB MATERNITY 4.3X12.25 (PERSONAL CARE ITEMS) ×3 IMPLANT
PENCIL SMOKE EVAC W/HOLSTER (ELECTROSURGICAL) ×3 IMPLANT
RTRCTR C-SECT PINK 25CM LRG (MISCELLANEOUS) ×3 IMPLANT
STRIP CLOSURE SKIN 1/2X4 (GAUZE/BANDAGES/DRESSINGS) ×2 IMPLANT
SUT VIC AB 0 CTX 36 (SUTURE) ×6
SUT VIC AB 0 CTX36XBRD ANBCTRL (SUTURE) ×3 IMPLANT
SUT VIC AB 2-0 CT1 27 (SUTURE) ×2
SUT VIC AB 2-0 CT1 TAPERPNT 27 (SUTURE) ×1 IMPLANT
SUT VIC AB 4-0 KS 27 (SUTURE) ×3 IMPLANT
TOWEL OR 17X24 6PK STRL BLUE (TOWEL DISPOSABLE) ×3 IMPLANT
TRAY FOLEY W/BAG SLVR 14FR LF (SET/KITS/TRAYS/PACK) ×3 IMPLANT

## 2017-11-27 NOTE — Anesthesia Postprocedure Evaluation (Signed)
Anesthesia Post Note  Patient: Olivine Gouger  Procedure(s) Performed: CESAREAN SECTION (N/A )     Patient location during evaluation: PACU Anesthesia Type: Spinal Level of consciousness: awake Pain management: pain level controlled Vital Signs Assessment: post-procedure vital signs reviewed and stable Respiratory status: spontaneous breathing Cardiovascular status: stable Postop Assessment: no headache, no backache, spinal receding, no apparent nausea or vomiting and patient able to bend at knees Anesthetic complications: no    Last Vitals:  Vitals:   11/27/17 1520 11/27/17 1627  BP: 134/85 (!) 140/95  Pulse: 76 72  Resp: 18 18  Temp: (!) 36.3 C 36.4 C  SpO2: 100% 100%    Last Pain:  Vitals:   11/27/17 1630  TempSrc:   PainSc: 0-No pain   Pain Goal: Patients Stated Pain Goal: 3 (11/26/17 1230)               Cniyah Sproull JR,JOHN Susann GivensFRANKLIN

## 2017-11-27 NOTE — Anesthesia Procedure Notes (Signed)
Spinal  Patient location during procedure: post-op Start time: 11/27/2017 12:53 PM End time: 11/27/2017 12:55 PM Staffing Anesthesiologist: Leilani AbleHatchett, Oronde Hallenbeck, MD Performed: anesthesiologist  Preanesthetic Checklist Completed: patient identified, site marked, surgical consent, pre-op evaluation, timeout performed, IV checked, risks and benefits discussed and monitors and equipment checked Spinal Block Patient position: sitting Prep: site prepped and draped and DuraPrep Patient monitoring: continuous pulse ox and blood pressure Approach: midline Location: L3-4 Injection technique: single-shot Needle Needle type: Pencan  Needle gauge: 24 G Needle length: 10 cm Needle insertion depth: 5 cm Assessment Sensory level: T4

## 2017-11-27 NOTE — Progress Notes (Signed)
Patient ID: [name] Young, female   DOB: August 13, 1990, 27 y.o.   MRN: [id number]  FACULTY PRACTICE ANTEPARTUM NOTE  [name] DimmerDymond Gayton is a 27 y.o. [id number] at 4033w2d  who is admitted for preeclampsia.   Fetal presentation is cephalic. Length of Stay:  1  Days  Subjective: Mild headache, no vision changes. Still has quite a bit of edema. Patient reports good fetal movement.   She reports no uterine contractions She reports no bleeding  She reports no loss of fluid per vagina.  Vitals:  Blood pressure (!) 160/102, pulse 60, temperature 98.7 F (37.1 C), temperature source Oral, resp. rate (!) 21, height 5\' 3"  (1.6 m), weight 159 lb (72.1 kg), last menstrual period 04/15/2017, SpO2 100 %, unknown if currently breastfeeding. Physical Examination:  General appearance - alert, well appearing, and in no distress Abdomen - soft, nontender, nondistended, no masses or organomegaly Fundal Height:  size equals dates Extremities: extremities normal, atraumatic, no cyanosis or edema and Homans sign is negative, no sign of DVT  Membranes:intact  Fetal Monitoring:  Baseline: 140s bpm, Variability: Good {> 6 bpm), Accelerations: nonreactive and Decelerations: Absent  Labs:  Results for orders placed or performed during the hospital encounter of 11/26/17 (from the past 24 hour(s))  Urinalysis, Routine w reflex microscopic   Collection Time: 11/26/17 10:01 AM  Result Value Ref Range   Color, Urine YELLOW YELLOW   APPearance HAZY (A) CLEAR   Specific Gravity, Urine 1.025 1.005 - 1.030   pH 5.0 5.0 - 8.0   Glucose, UA NEGATIVE NEGATIVE mg/dL   Hgb urine dipstick NEGATIVE NEGATIVE   Bilirubin Urine NEGATIVE NEGATIVE   Ketones, ur NEGATIVE NEGATIVE mg/dL   Protein, ur >=784>=300 (A) NEGATIVE mg/dL   Nitrite NEGATIVE NEGATIVE   Leukocytes, UA NEGATIVE NEGATIVE   RBC / HPF 0-5 0 - 5 RBC/hpf   WBC, UA 6-10 0 - 5 WBC/hpf   Bacteria, UA RARE (A) NONE SEEN   Squamous Epithelial / LPF 0-5 0 - 5   Mucus PRESENT    Protein / creatinine ratio, urine   Collection Time: 11/26/17 10:02 AM  Result Value Ref Range   Creatinine, Urine 314.00 mg/dL   Total Protein, Urine 1,482 mg/dL   Protein Creatinine Ratio 4.72 (H) 0.00 - 0.15 mg/mg[Cre]  CBC   Collection Time: 11/26/17 10:19 AM  Result Value Ref Range   WBC 6.5 4.0 - 10.5 K/uL   RBC 4.55 3.87 - 5.11 MIL/uL   Hemoglobin 12.5 12.0 - 15.0 g/dL   HCT 69.637.8 29.536.0 - 28.446.0 %   MCV 83.1 78.0 - 100.0 fL   MCH 27.5 26.0 - 34.0 pg   MCHC 33.1 30.0 - 36.0 g/dL   RDW 13.214.0 44.011.5 - 10.215.5 %   Platelets 237 150 - 400 K/uL  Comprehensive metabolic panel   Collection Time: 11/26/17 10:19 AM  Result Value Ref Range   Sodium 136 135 - 145 mmol/L   Potassium 5.1 3.5 - 5.1 mmol/L   Chloride 107 98 - 111 mmol/L   CO2 19 (L) 22 - 32 mmol/L   Glucose, Bld 62 (L) 70 - 99 mg/dL   BUN 15 6 - 20 mg/dL   Creatinine, Ser 7.251.10 (H) 0.44 - 1.00 mg/dL   Calcium 9.0 8.9 - 36.610.3 mg/dL   Total Protein 6.2 (L) 6.5 - 8.1 g/dL   Albumin 2.9 (L) 3.5 - 5.0 g/dL   AST 27 15 - 41 U/L   ALT 12 0 - 44 U/L   Alkaline  Phosphatase 238 (H) 38 - 126 U/L   Total Bilirubin 0.6 0.3 - 1.2 mg/dL   GFR calc non Af Amer >60 >60 mL/min   GFR calc Af Amer >60 >60 mL/min   Anion gap 10 5 - 15  Type and screen [facility]   Collection Time: 11/26/17 10:19 AM  Result Value Ref Range   ABO/RH(D) B NEG    Antibody Screen NEG    Sample Expiration 11/29/2017    Weak D      POS Performed at [facility], [address], [place], [place] [address]   CBC   Collection Time: 11/27/17  6:04 AM  Result Value Ref Range   WBC 9.5 4.0 - 10.5 K/uL   RBC 4.78 3.87 - 5.11 MIL/uL   Hemoglobin 13.1 12.0 - 15.0 g/dL   HCT 60.4 54.0 - 98.1 %   MCV 82.4 78.0 - 100.0 fL   MCH 27.4 26.0 - 34.0 pg   MCHC 33.2 30.0 - 36.0 g/dL   RDW 19.1 47.8 - 29.5 %   Platelets 253 150 - 400 K/uL  Comprehensive metabolic panel   Collection Time: 11/27/17  6:04 AM  Result Value Ref Range   Sodium 135 135  - 145 mmol/L   Potassium 4.4 3.5 - 5.1 mmol/L   Chloride 104 98 - 111 mmol/L   CO2 19 (L) 22 - 32 mmol/L   Glucose, Bld 95 70 - 99 mg/dL   BUN 17 6 - 20 mg/dL   Creatinine, Ser 6.21 (H) 0.44 - 1.00 mg/dL   Calcium 8.9 8.9 - 30.8 mg/dL   Total Protein 5.9 (L) 6.5 - 8.1 g/dL   Albumin 2.7 (L) 3.5 - 5.0 g/dL   AST 25 15 - 41 U/L   ALT 11 0 - 44 U/L   Alkaline Phosphatase 218 (H) 38 - 126 U/L   Total Bilirubin 0.6 0.3 - 1.2 mg/dL   GFR calc non Af Amer >60 >60 mL/min   GFR calc Af Amer >60 >60 mL/min   Anion gap 12 5 - 15    Imaging Studies:       Medications:  Scheduled . aspirin  81 mg Oral Daily  . betamethasone acetate-betamethasone sodium phosphate  12 mg Intramuscular Q24 Hr x 2  . docusate sodium  100 mg Oral Daily  . labetalol      . magnesium  4 g Intravenous Once  . prenatal multivitamin  1 tablet Oral Q1200   I have reviewed the patient's current medications.  ASSESSMENT: Active Problems:   History of cesarean delivery   Severe preeclampsia, third trimester   PLAN: Patient had second severe range BP Cr increased to 1.16, which is an indication for delivery with severe preeclampsia. 2nd dose of BMZ now. Start magnesium 4g/2g  The risks of cesarean section discussed with the patient included but were not limited to: bleeding which may require transfusion or reoperation; infection which may require antibiotics; injury to bowel, bladder, ureters or other surrounding organs; injury to the fetus; need for additional procedures including hysterectomy in the event of a life-threatening hemorrhage; placental abnormalities wth subsequent pregnancies, incisional problems, thromboembolic phenomenon and other postoperative/anesthesia complications. The patient concurred with the proposed plan, giving informed written consent for the procedure.   Patient has been NPO since last night, she will remain NPO for procedure. Anesthesia and OR aware.  Preoperative prophylactic Ancef  ordered on call to the OR.  To OR when ready.   Candelaria Celeste  J, DO 11/27/2017,8:57 AM

## 2017-11-27 NOTE — Anesthesia Preprocedure Evaluation (Signed)
Anesthesia Evaluation  Patient identified by MRN, date of birth, ID band Patient awake    Reviewed: Allergy & Precautions, H&P , NPO status , Patient's Chart, lab work & pertinent test results  Airway Mallampati: I  TM Distance: >3 FB Neck ROM: full    Dental no notable dental hx. (+) Teeth Intact   Pulmonary neg pulmonary ROS,    Pulmonary exam normal breath sounds clear to auscultation       Cardiovascular hypertension, Pt. on medications negative cardio ROS Normal cardiovascular exam Rhythm:regular Rate:Normal     Neuro/Psych Depression negative neurological ROS     GI/Hepatic negative GI ROS, Neg liver ROS,   Endo/Other  negative endocrine ROS  Renal/GU negative Renal ROS     Musculoskeletal   Abdominal Normal abdominal exam  (+)   Peds  Hematology negative hematology ROS (+)   Anesthesia Other Findings   Reproductive/Obstetrics (+) Pregnancy                             Anesthesia Physical Anesthesia Plan  ASA: II  Anesthesia Plan: Spinal   Post-op Pain Management:    Induction:   PONV Risk Score and Plan: 3 and Ondansetron, Dexamethasone and Scopolamine patch - Pre-op  Airway Management Planned: Natural Airway and Nasal Cannula  Additional Equipment:   Intra-op Plan:   Post-operative Plan:   Informed Consent: I have reviewed the patients History and Physical, chart, labs and discussed the procedure including the risks, benefits and alternatives for the proposed anesthesia with the patient or authorized representative who has indicated his/her understanding and acceptance.     Plan Discussed with:   Anesthesia Plan Comments:         Anesthesia Quick Evaluation

## 2017-11-27 NOTE — Transfer of Care (Signed)
Immediate Anesthesia Transfer of Care Note  Patient: [name] Young  Procedure(s) Performed: CESAREAN SECTION (N/A )  Patient Location: PACU  Anesthesia Type:Spinal  Level of Consciousness: awake, alert  and oriented  Airway & Oxygen Therapy: Patient Spontanous Breathing  Post-op Assessment: Report given to RN and Post -op Vital signs reviewed and stable  Post vital signs: Reviewed and stable  Last Vitals:  Vitals Value Taken Time  BP 126/85 11/27/2017  2:02 PM  Temp    Pulse 85 11/27/2017  2:03 PM  Resp 17 11/27/2017  2:03 PM  SpO2 100 % 11/27/2017  2:03 PM  Vitals shown include unvalidated device data.  Last Pain:  Vitals:   11/27/17 0848  TempSrc:   PainSc: 0-No pain      Patients Stated Pain Goal: 3 (11/26/17 1230)  Complications: No apparent anesthesia complications

## 2017-11-27 NOTE — Progress Notes (Signed)
Patient called nurse in due to being tachycardic and not feeling like she could breathe well. O2 Sat was put on finger and was 100 but HR was in 150's. Dr. Vergie LivingPickens was notified and he ordered LR bolus and to put baby on monitor. Patient is feeling better after doing some deep breathing exercises and talking with nurse.

## 2017-11-27 NOTE — Op Note (Signed)
[name] Young PROCEDURE DATE: 11/27/2017  PREOPERATIVE DIAGNOSIS: Intrauterine pregnancy at  [redacted]w[redacted]d weeks gestation; severe preeclampsia, prior cesarean section x3, elevated creatinine.  POSTOPERATIVE DIAGNOSIS: The same  PROCEDURE: Repeat Low Transverse Cesarean Section  SURGEON:  Dr. Candelaria Celeste  ASSISTANT: None  INDICATIONS: [name] Young is a 27 y.o. [id number] at [redacted]w[redacted]d scheduled for cesarean section secondary to severe preeclampsia, prior cesarean section x3, elevated creatinine.  The risks of cesarean section discussed with the patient included but were not limited to: bleeding which may require transfusion or reoperation; infection which may require antibiotics; injury to bowel, bladder, ureters or other surrounding organs; injury to the fetus; need for additional procedures including hysterectomy in the event of a life-threatening hemorrhage; placental abnormalities wth subsequent pregnancies, incisional problems, thromboembolic phenomenon and other postoperative/anesthesia complications. The patient concurred with the proposed plan, giving informed written consent for the procedure.    FINDINGS:  Viable female infant in LOA presentation.  Apgars 7 and 9, weight, 3 pounds and 7 ounces.  Clear amniotic fluid.  Intact placenta, three vessel cord.  Normal uterus, fallopian tubes and ovaries bilaterally.  ANESTHESIA:    Spinal INTRAVENOUS FLUIDS: 2500 ml ESTIMATED BLOOD LOSS: 500 ml URINE OUTPUT:  450 ml SPECIMENS: Placenta sent to pathology COMPLICATIONS: None immediate  PROCEDURE IN DETAIL:  The patient received intravenous antibiotics and had sequential compression devices applied to her lower extremities while in the preoperative area.  She was then taken to the operating room where spinal anesthesia was administered and was found to be adequate. She was then placed in a dorsal supine position with a leftward tilt, and prepped and draped in a sterile manner.  A foley catheter was placed into  her bladder and attached to constant gravity, which drained clear fluid throughout.  After an adequate timeout was performed, a Pfannenstiel skin incision was made with scalpel and carried through to the underlying layer of fascia. The fascia was incised in the midline and this incision was extended bilaterally using the Mayo scissors. Kocher clamps were applied to the superior aspect of the fascial incision and the underlying rectus muscles were dissected off bluntly. A similar process was carried out on the inferior aspect of the facial incision. The rectus muscles were separated in the midline bluntly and the peritoneum was entered bluntly. An Alexis retractor was placed to aid in visualization of the uterus.  Attention was turned to the lower uterine segment where a transverse hysterotomy was made with a scalpel and extended bilaterally bluntly. The infant was successfully delivered, and cord was clamped and cut and infant was handed over to awaiting neonatology team. Uterine massage was then administered and the placenta delivered intact with three-vessel cord. The uterus was then cleared of clot and debris.  The hysterotomy was closed with 0 Vicryl in a running locked fashion, and an imbricating layer was also placed with a 0 Vicryl. Overall, excellent hemostasis was noted. The abdomen and the pelvis were cleared of all clot and debris and the Jon Gills was removed. Hemostasis was confirmed on all surfaces.  The peritoneum was reapproximated using 2-0 vicryl running stitches. The fascia was then closed using 0 Vicryl in a running fashion. The skin was closed with 4-0 vicryl. The patient tolerated the procedure well. Sponge, lap, instrument and needle counts were correct x 2. She was taken to the recovery room in stable condition.   Due to this being her 4th cesarean section and the possibility of encountering adhesions, Dr Macon Large was readily available for  assistance, but was not needed.  Levie HeritageStinson, Kary Colaizzi J,  DO 11/27/2017 1:55 PM

## 2017-11-28 ENCOUNTER — Encounter (HOSPITAL_COMMUNITY): Payer: Self-pay | Admitting: Family Medicine

## 2017-11-28 LAB — CBC
HEMATOCRIT: 34.1 % — AB (ref 36.0–46.0)
Hemoglobin: 11.4 g/dL — ABNORMAL LOW (ref 12.0–15.0)
MCH: 27.7 pg (ref 26.0–34.0)
MCHC: 33.4 g/dL (ref 30.0–36.0)
MCV: 83 fL (ref 78.0–100.0)
PLATELETS: 235 10*3/uL (ref 150–400)
RBC: 4.11 MIL/uL (ref 3.87–5.11)
RDW: 14.2 % (ref 11.5–15.5)
WBC: 15.9 10*3/uL — ABNORMAL HIGH (ref 4.0–10.5)

## 2017-11-28 LAB — BASIC METABOLIC PANEL
Anion gap: 9 (ref 5–15)
BUN: 17 mg/dL (ref 6–20)
CO2: 23 mmol/L (ref 22–32)
CREATININE: 1.04 mg/dL — AB (ref 0.44–1.00)
Calcium: 7.8 mg/dL — ABNORMAL LOW (ref 8.9–10.3)
Chloride: 101 mmol/L (ref 98–111)
GFR calc non Af Amer: >60 mL/min (ref >60)
GLUCOSE: 104 mg/dL — AB (ref 70–99)
Potassium: 5.4 mmol/L — ABNORMAL HIGH (ref 3.5–5.1)
Sodium: 133 mmol/L — ABNORMAL LOW (ref 135–145)

## 2017-11-28 LAB — KLEIHAUER-BETKE STAIN
# Vials RhIg: 1
FETAL CELLS %: 0 %
Quantitation Fetal Hemoglobin: 0 mL

## 2017-11-28 LAB — MAGNESIUM: Magnesium: 7.1 mg/dL (ref 1.7–2.4)

## 2017-11-28 MED ORDER — FAMOTIDINE IN NACL 20-0.9 MG/50ML-% IV SOLN
20.0000 mg | Freq: Once | INTRAVENOUS | Status: AC
  Administered 2017-11-28: 20 mg via INTRAVENOUS
  Filled 2017-11-28: qty 50

## 2017-11-28 MED ORDER — HYDRALAZINE HCL 20 MG/ML IJ SOLN
5.0000 mg | INTRAMUSCULAR | Status: AC | PRN
  Administered 2017-11-28: 5 mg via INTRAVENOUS
  Administered 2017-11-28: 10 mg via INTRAVENOUS
  Filled 2017-11-28: qty 1

## 2017-11-28 MED ORDER — METHYLPREDNISOLONE SODIUM SUCC 125 MG IJ SOLR
125.0000 mg | Freq: Once | INTRAMUSCULAR | Status: AC
  Administered 2017-11-28: 125 mg via INTRAVENOUS
  Filled 2017-11-28: qty 2

## 2017-11-28 MED ORDER — ENALAPRIL MALEATE 5 MG PO TABS
5.0000 mg | ORAL_TABLET | Freq: Every day | ORAL | Status: DC
  Administered 2017-11-28: 5 mg via ORAL
  Filled 2017-11-28: qty 1

## 2017-11-28 MED ORDER — HYDRALAZINE HCL 20 MG/ML IJ SOLN
INTRAMUSCULAR | Status: AC
  Filled 2017-11-28: qty 1

## 2017-11-28 MED ORDER — NIFEDIPINE ER OSMOTIC RELEASE 30 MG PO TB24
30.0000 mg | ORAL_TABLET | Freq: Every day | ORAL | Status: DC
  Administered 2017-11-28 – 2017-11-29 (×2): 30 mg via ORAL
  Filled 2017-11-28 (×2): qty 1

## 2017-11-28 MED ORDER — RHO D IMMUNE GLOBULIN 1500 UNIT/2ML IJ SOSY
300.0000 ug | PREFILLED_SYRINGE | Freq: Once | INTRAMUSCULAR | Status: AC
  Administered 2017-11-28: 300 ug via INTRAVENOUS
  Filled 2017-11-28: qty 2

## 2017-11-28 MED ORDER — HYDRALAZINE HCL 20 MG/ML IJ SOLN
10.0000 mg | Freq: Once | INTRAMUSCULAR | Status: AC
  Administered 2017-11-28: 10 mg via INTRAVENOUS
  Filled 2017-11-28: qty 1

## 2017-11-28 MED ORDER — LABETALOL HCL 5 MG/ML IV SOLN
20.0000 mg | INTRAVENOUS | Status: AC | PRN
  Administered 2017-11-29 (×2): 20 mg via INTRAVENOUS
  Filled 2017-11-28: qty 4

## 2017-11-28 NOTE — Progress Notes (Signed)
Postpartum Day 1: Cesarean Delivery for Severe Preeclampsia at 8642w3d  Subjective: Denies any headaches, RUQ pain, visual symptoms. Required IV antihypertensives this morning. Patient reports incisional pain and no problems voiding.    Objective: Vital signs in last 24 hours: Temp:  [97.4 F (36.3 C)-98.7 F (37.1 C)] 97.8 F (36.6 C) (07/31 0432) Pulse Rate:  [46-92] 46 (07/31 0432) Resp:  [15-24] 18 (07/31 0600) BP: (126-177)/(78-116) 158/98 (07/31 0510) SpO2:  [98 %-100 %] 98 % (07/31 0600) Patient Vitals for the past 24 hrs:  BP Temp Temp src Pulse Resp SpO2  11/28/17 0600 - - - - 18 98 %  11/28/17 0510 (!) 158/98 - - - - -  11/28/17 0450 (!) 177/116 - - - - -  11/28/17 0442 (!) 177/116 - - - - -  11/28/17 0432 (!) 161/103 97.8 F (36.6 C) Oral (!) 46 16 100 %  11/28/17 0400 - - - - 18 98 %  11/28/17 0300 - - - - 18 98 %  11/28/17 0200 - - - - 20 99 %  11/28/17 0100 - - - - 20 99 %  11/28/17 0000 - - - - 16 99 %  11/27/17 2305 (!) 141/90 97.7 F (36.5 C) Oral (!) 50 16 100 %  11/27/17 2200 - - - - 18 98 %  11/27/17 2100 - - - - 18 100 %  11/27/17 2000 - - - - 18 -  11/27/17 1929 134/81 98.5 F (36.9 C) Oral 75 16 100 %  11/27/17 1756 (!) 148/91 97.9 F (36.6 C) Oral 91 18 100 %  11/27/17 1627 (!) 140/95 97.6 F (36.4 C) Oral 72 18 100 %  11/27/17 1520 134/85 (!) 97.4 F (36.3 C) - 76 18 100 %  11/27/17 1500 133/83 - - 75 15 100 %  11/27/17 1445 133/79 97.6 F (36.4 C) - 88 (!) 21 99 %  11/27/17 1430 (!) 135/92 - - 92 (!) 24 100 %  11/27/17 1415 131/80 - - 92 (!) 21 100 %  11/27/17 1405 126/85 97.8 F (36.6 C) Oral 92 17 100 %  11/27/17 1219 (!) 151/98 - - 82 - -  11/27/17 1115 - - - - - 98 %  11/27/17 1114 - - - - 16 -  11/27/17 1100 (!) 141/90 - - 82 - -  11/27/17 1027 133/85 - - 83 16 100 %  11/27/17 1024 132/87 - - 84 16 100 %  11/27/17 1021 (!) 142/87 - - 87 17 -  11/27/17 1019 - - - - - 100 %  11/27/17 1018 133/86 - - 80 17 100 %  11/27/17 1015 129/90  - - 85 16 -  11/27/17 1012 135/86 - - 82 - 100 %  11/27/17 1010 131/89 - - 82 16 100 %  11/27/17 0840 127/78 - - 78 - -  11/27/17 0830 139/89 - - 73 - -  11/27/17 0816 (!) 160/102 - - 60 - 100 %  11/27/17 0756 (!) 158/111 98.7 F (37.1 C) Oral 82 (!) 21 100 %    Physical Exam:  General: alert and no distress Lochia: appropriate Uterine Fundus: firm Incision: healing well, no significant drainage DVT Evaluation: No evidence of DVT seen on physical exam. Negative Homan's sign.No cords or calf tenderness.  Recent Labs    11/27/17 1603 11/28/17 0547  HGB 12.4 11.4*  HCT 37.3 34.1*   CBC Latest Ref Rng & Units 11/28/2017 11/27/2017 11/27/2017  WBC 4.0 - 10.5 K/uL  15.9(H) 9.2 9.5  Hemoglobin 12.0 - 15.0 g/dL 11.4(L) 12.4 13.1  Hematocrit 36.0 - 46.0 % 34.1(L) 37.3 39.4  Platelets 150 - 400 K/uL 235 243 253   CMP Latest Ref Rng & Units 11/28/2017 11/27/2017 11/27/2017  Glucose 70 - 99 mg/dL 161(W) - 95  BUN 6 - 20 mg/dL 17 - 17  Creatinine 9.60 - 1.00 mg/dL 4.54(U) 9.81(X) 9.14(N)  Sodium 135 - 145 mmol/L 133(L) - 135  Potassium 3.5 - 5.1 mmol/L 5.4(H) - 4.4  Chloride 98 - 111 mmol/L 101 - 104  CO2 22 - 32 mmol/L 23 - 19(L)  Calcium 8.9 - 10.3 mg/dL 7.8(L) - 8.9  Total Protein 6.5 - 8.1 g/dL - - 5.9(L)  Total Bilirubin 0.3 - 1.2 mg/dL - - 0.6  Alkaline Phos 38 - 126 U/L - - 218(H)  AST 15 - 41 U/L - - 25  ALT 0 - 44 U/L - - 11    Assessment/Plan: Status post Cesarean section. Doing well postoperatively.  Continue magnesium sulfate until 13:19 today Enalapril ordered for BP control BUFA; desires interval BTL Continue close observation  Jaynie Collins, MD 11/28/2017, 7:51 AM

## 2017-11-28 NOTE — Progress Notes (Signed)
CRITICAL VALUE ALERT  Critical Value:  Magnesium level 7.1  Date & Time Notied:  11/28/2017  Provider Notified: Anyanwu  Orders Received/Actions taken: none

## 2017-11-28 NOTE — Anesthesia Postprocedure Evaluation (Signed)
Anesthesia Post Note  Patient: [name] Young  Procedure(s) Performed: CESAREAN SECTION (N/A )     Patient location during evaluation: Mother Baby Anesthesia Type: Spinal Level of consciousness: awake and alert Pain management: pain level controlled Vital Signs Assessment: post-procedure vital signs reviewed and stable Respiratory status: spontaneous breathing Cardiovascular status: stable Postop Assessment: no headache, patient able to bend at knees, no backache, adequate PO intake and spinal receding Anesthetic complications: no    Last Vitals:  Vitals:   11/28/17 0600 11/28/17 0820  BP:  (!) 190/120  Pulse:  62  Resp: 18 18  Temp:    SpO2: 98% 100%    Last Pain:  Vitals:   11/28/17 0432  TempSrc: Oral  PainSc:    Pain Goal: Patients Stated Pain Goal: 3 (11/26/17 1230)               Jennye MoccasinSterling, Weldon Pickingharlesetta Marie

## 2017-11-28 NOTE — Progress Notes (Signed)
Patient ID: [name] Young, female   DOB: Oct 08, 1990, 27 y.o.   MRN: [id number]  Evaluated patient for complaints of feeling like back of throat and palate swelling after taking vasotec. Patient received benadryl 12.5mg  IV once, mild improvement. Still has some symptoms.   O2 stat 100%.  Will give solumedrol and pepcid and reevaluate.  Levie HeritageJacob J Stinson, DO 11/28/2017 9:43 AM

## 2017-11-28 NOTE — Progress Notes (Signed)
Rn entered room to help patient with meal tray.  Patient was sobbing and breathing hard, appearing to have a panic attack.  She stated she felt "overwhelmed" and "like I am going crazy". Rn provided emotional support at this time. Patient calmed and eating.

## 2017-11-28 NOTE — Addendum Note (Signed)
Addendum  created 11/28/17 0830 by Armanda HeritageSterling, Laquincy Eastridge M, CRNA   Sign clinical note

## 2017-11-28 NOTE — Progress Notes (Signed)
CSW met with [name] to offer support and complete assessment.  CSW spent approximately an hour with [name], but conversation ended abruptly when she started feeling ill again.  CSW will return again tomorrow to complete assessment/documentation.

## 2017-11-28 NOTE — Lactation Note (Signed)
This note was copied from a baby's chart. Lactation Consultation Note  Patient Name: [name] Young: 11/28/2017    P4 mother whose infant is now 5024 hours old.  Mother is putting this baby up for adoption.  Spoke with mother regarding breast milk suppression since she will not be needing her breast milk to come to volume.  She was unaware of how to suppress her milk.  Instructed her to wear a good tight fitting bra or sports bra 24 hours/day.  Reviewed engorgement prevention/treatment.  Also suggested cabbage leaves for severe engorgement.    While I was in the room mother started casually talking to me about how she thinks her nurses don't listen to her.  She stated a couple of incidences with her blood pressures and allergic reaction that prompted her to vent to me.  I talked calmly with her and reassured her that I would speak to the nurses on the floor and voice her concern.  After leaving her room I spoke with an Charity fundraiserN and a Press photographercharge nurse and the nurses verified that she has had a lot if interaction with her nurses.  All personnel have taken a good amount of time working with her.  They will continue to follow up.             Jeilyn Reznik R Melvin Marmo 11/28/2017, 1:46 PM

## 2017-11-29 LAB — RH IG WORKUP (INCLUDES ABO/RH)
ABO/RH(D): B NEG
Gestational Age(Wks): 32.2
UNIT DIVISION: 0

## 2017-11-29 LAB — RPR: RPR Ser Ql: NONREACTIVE

## 2017-11-29 MED ORDER — LABETALOL HCL 5 MG/ML IV SOLN: 40.0000 mg | INTRAVENOUS | Status: DC | PRN

## 2017-11-29 MED ORDER — CITALOPRAM HYDROBROMIDE 20 MG PO TABS
20.0000 mg | ORAL_TABLET | Freq: Every day | ORAL | Status: DC
Start: 2017-11-30 — End: 2017-11-30
  Administered 2017-11-30: 20 mg via ORAL
  Filled 2017-11-29: qty 1

## 2017-11-29 MED ORDER — NIFEDIPINE ER OSMOTIC RELEASE 30 MG PO TB24: 60.0000 mg | ORAL_TABLET | Freq: Every day | ORAL | Status: DC

## 2017-11-29 MED ORDER — LABETALOL HCL 5 MG/ML IV SOLN: 20.0000 mg | INTRAVENOUS | Status: DC | PRN

## 2017-11-29 MED ORDER — HYDRALAZINE HCL 20 MG/ML IJ SOLN: 10.0000 mg | INTRAMUSCULAR | Status: DC | PRN

## 2017-11-29 MED ORDER — HYDROCHLOROTHIAZIDE 25 MG PO TABS
25.0000 mg | ORAL_TABLET | Freq: Every day | ORAL | Status: DC
  Administered 2017-11-30: 25 mg via ORAL
  Filled 2017-11-29: qty 1

## 2017-11-29 MED ORDER — NIFEDIPINE ER OSMOTIC RELEASE 30 MG PO TB24: 30.0000 mg | ORAL_TABLET | Freq: Every day | ORAL | Status: DC

## 2017-11-29 MED ORDER — HYDRALAZINE HCL 20 MG/ML IJ SOLN: 5.0000 mg | INTRAMUSCULAR | Status: DC | PRN

## 2017-11-29 MED ORDER — HYDROCHLOROTHIAZIDE 25 MG PO TABS
25.0000 mg | ORAL_TABLET | Freq: Every day | ORAL | Status: DC
  Administered 2017-11-29: 25 mg via ORAL
  Filled 2017-11-29: qty 1

## 2017-11-29 MED ORDER — NIFEDIPINE ER OSMOTIC RELEASE 30 MG PO TB24
30.0000 mg | ORAL_TABLET | Freq: Once | ORAL | Status: AC
  Administered 2017-11-29: 30 mg via ORAL
  Filled 2017-11-29: qty 1

## 2017-11-29 MED ORDER — NIFEDIPINE ER OSMOTIC RELEASE 30 MG PO TB24
60.0000 mg | ORAL_TABLET | Freq: Every day | ORAL | Status: DC
  Administered 2017-11-30: 60 mg via ORAL
  Filled 2017-11-29: qty 2

## 2017-11-29 NOTE — Progress Notes (Signed)
Subjective: Postpartum Day 2: Cesarean Delivery Patient reports incisional pain, tolerating PO and no problems voiding.  BP elevated. Reports some depression  Objective: Vital signs in last 24 hours: Temp:  [97.9 F (36.6 C)-98.7 F (37.1 C)] 98.5 F (36.9 C) (08/01 0809) Pulse Rate:  [51-87] 77 (08/01 1530) Resp:  [16-18] 16 (08/01 0809) BP: (136-184)/(74-124) 154/111 (08/01 1530) SpO2:  [97 %-100 %] 100 % (08/01 1404)  Physical Exam:  General: alert, cooperative and no distress Lochia: appropriate Uterine Fundus: firm Incision: healing well, no significant drainage, no dehiscence, no significant erythema DVT Evaluation: No evidence of DVT seen on physical exam. Negative Homan's sign.  Recent Labs    11/27/17 1603 11/28/17 0547  HGB 12.4 11.4*  HCT 37.3 34.1*    Assessment/Plan: Status post Cesarean section. Doing well postoperatively.  Continue current care. Continue Nifedipine, hctz added. BP not controlled. Hopefully, will be controlled tomorrow and can discharge patient.  [name] Young 11/29/2017, 3:37 PM

## 2017-11-30 MED ORDER — SENNOSIDES-DOCUSATE SODIUM 8.6-50 MG PO TABS: 2.0000 | ORAL_TABLET | ORAL | 3 refills | Status: DC

## 2017-11-30 MED ORDER — OXYCODONE-ACETAMINOPHEN 5-325 MG PO TABS: 1.0000 | ORAL_TABLET | ORAL | 0 refills | Status: DC | PRN

## 2017-11-30 MED ORDER — HYDROCHLOROTHIAZIDE 25 MG PO TABS: 25.0000 mg | ORAL_TABLET | Freq: Every day | ORAL | 3 refills | Status: DC

## 2017-11-30 MED ORDER — CITALOPRAM HYDROBROMIDE 20 MG PO TABS: 20.0000 mg | ORAL_TABLET | Freq: Every day | ORAL | 3 refills | Status: DC

## 2017-11-30 MED ORDER — NIFEDIPINE ER 60 MG PO TB24: 60.0000 mg | ORAL_TABLET | Freq: Every day | ORAL | 3 refills | Status: DC

## 2017-11-30 MED ORDER — IBUPROFEN 600 MG PO TABS: 600.0000 mg | ORAL_TABLET | Freq: Four times a day (QID) | ORAL | 0 refills | Status: DC

## 2017-11-30 MED ORDER — MEDROXYPROGESTERONE ACETATE 150 MG/ML IM SUSP
150.0000 mg | Freq: Once | INTRAMUSCULAR | Status: AC
  Administered 2017-11-30: 150 mg via INTRAMUSCULAR
  Filled 2017-11-30: qty 1

## 2017-11-30 NOTE — Discharge Instructions (Signed)
Preeclampsia and Eclampsia °Preeclampsia is a serious condition that develops only during pregnancy. It is also called toxemia of pregnancy. This condition causes high blood pressure along with other symptoms, such as swelling and headaches. These symptoms may develop as the condition gets worse. Preeclampsia may occur at 20 weeks of pregnancy or later. °Diagnosing and treating preeclampsia early is very important. If not treated early, it can cause serious problems for you and your baby. One problem it can lead to is eclampsia, which is a condition that causes muscle jerking or shaking (convulsions or seizures) in the mother. Delivering your baby is the best treatment for preeclampsia or eclampsia. Preeclampsia and eclampsia symptoms usually go away after your baby is born. °What are the causes? °The cause of preeclampsia is not known. °What increases the risk? °The following risk factors make you more likely to develop preeclampsia: °· Being pregnant for the first time. °· Having had preeclampsia during a past pregnancy. °· Having a family history of preeclampsia. °· Having high blood pressure. °· Being pregnant with twins or triplets. °· Being 35 or older. °· Being African-American. °· Having kidney disease or diabetes. °· Having medical conditions such as lupus or blood diseases. °· Being very overweight (obese). ° °What are the signs or symptoms? °The earliest signs of preeclampsia are: °· High blood pressure. °· Increased protein in your urine. Your health care provider will check for this at every visit before you give birth (prenatal visit). ° °Other symptoms that may develop as the condition gets worse include: °· Severe headaches. °· Sudden weight gain. °· Swelling of the hands, face, legs, and feet. °· Nausea and vomiting. °· Vision problems, such as blurred or double vision. °· Numbness in the face, arms, legs, and feet. °· Urinating less than usual. °· Dizziness. °· Slurred speech. °· Abdominal pain,  especially upper abdominal pain. °· Convulsions or seizures. ° °Symptoms generally go away after giving birth. °How is this diagnosed? °There are no screening tests for preeclampsia. Your health care provider will ask you about symptoms and check for signs of preeclampsia during your prenatal visits. You may also have tests that include: °· Urine tests. °· Blood tests. °· Checking your blood pressure. °· Monitoring your baby’s heart rate. °· Ultrasound. ° °How is this treated? °You and your health care provider will determine the treatment approach that is best for you. Treatment may include: °· Having more frequent prenatal exams to check for signs of preeclampsia, if you have an increased risk for preeclampsia. °· Bed rest. °· Reducing how much salt (sodium) you eat. °· Medicine to lower your blood pressure. °· Staying in the hospital, if your condition is severe. There, treatment will focus on controlling your blood pressure and the amount of fluids in your body (fluid retention). °· You may need to take medicine (magnesium sulfate) to prevent seizures. This medicine may be given as an injection or through an IV tube. °· Delivering your baby early, if your condition gets worse. You may have your labor started with medicine (induced), or you may have a cesarean delivery. ° °Follow these instructions at home: °Eating and drinking ° °· Drink enough fluid to keep your urine clear or pale yellow. °· Eat a healthy diet that is low in sodium. Do not add salt to your food. Check nutrition labels to see how much sodium a food or beverage contains. °· Avoid caffeine. °Lifestyle °· Do not use any products that contain nicotine or tobacco, such as cigarettes   and e-cigarettes. If you need help quitting, ask your health care provider. °· Do not use alcohol or drugs. °· Avoid stress as much as possible. Rest and get plenty of sleep. °General instructions °· Take over-the-counter and prescription medicines only as told by your  health care provider. °· When lying down, lie on your side. This keeps pressure off of your baby. °· When sitting or lying down, raise (elevate) your feet. Try putting some pillows underneath your lower legs. °· Exercise regularly. Ask your health care provider what kinds of exercise are best for you. °· Keep all follow-up and prenatal visits as told by your health care provider. This is important. °How is this prevented? °To prevent preeclampsia or eclampsia from developing during another pregnancy: °· Get proper medical care during pregnancy. Your health care provider may be able to prevent preeclampsia or diagnose and treat it early. °· Your health care provider may have you take a low-dose aspirin or a calcium supplement during your next pregnancy. °· You may have tests of your blood pressure and kidney function after giving birth. °· Maintain a healthy weight. Ask your health care provider for help managing weight gain during pregnancy. °· Work with your health care provider to manage any long-term (chronic) health conditions you have, such as diabetes or kidney problems. ° °Contact a health care provider if: °· You gain more weight than expected. °· You have headaches. °· You have nausea or vomiting. °· You have abdominal pain. °· You feel dizzy or light-headed. °Get help right away if: °· You develop sudden or severe swelling anywhere in your body. This usually happens in the legs. °· You gain 5 lbs (2.3 kg) or more during one week. °· You have severe: °? Abdominal pain. °? Headaches. °? Dizziness. °? Vision problems. °? Confusion. °? Nausea or vomiting. °· You have a seizure. °· You have trouble moving any part of your body. °· You develop numbness in any part of your body. °· You have trouble speaking. °· You have any abnormal bleeding. °· You pass out. °This information is not intended to replace advice given to you by your health care provider. Make sure you discuss any questions you have with your health  care provider. °Document Released: 04/14/2000 Document Revised: 12/14/2015 Document Reviewed: 11/22/2015 °[employer] Interactive Patient Education © 2018 [employer] °Vaginal Delivery, Care After °Refer to this sheet in the next few weeks. These instructions provide you with information about caring for yourself after vaginal delivery. Your health care provider may also give you more specific instructions. Your treatment has been planned according to current medical practices, but problems sometimes occur. Call your health care provider if you have any problems or questions. °What can I expect after the procedure? °After vaginal delivery, it is common to have: °· Some bleeding from your vagina. °· Soreness in your abdomen, your vagina, and the area of skin between your vaginal opening and your anus (perineum). °· Pelvic cramps. °· Fatigue. ° °Follow these instructions at home: °Medicines °· Take over-the-counter and prescription medicines only as told by your health care provider. °· If you were prescribed an antibiotic medicine, take it as told by your health care provider. Do not stop taking the antibiotic until it is finished. °Driving ° °· Do not drive or operate heavy machinery while taking prescription pain medicine. °· Do not drive for 24 hours if you received a sedative. °Lifestyle °· Do not drink alcohol. This is especially important if you are breastfeeding   or taking medicine to relieve pain. °· Do not use tobacco products, including cigarettes, chewing tobacco, or e-cigarettes. If you need help quitting, ask your health care provider. °Eating and drinking °· Drink at least 8 eight-ounce glasses of water every day unless you are told not to by your health care provider. If you choose to breastfeed your baby, you may need to drink more water than this. °· Eat high-fiber foods every day. These foods may help prevent or relieve constipation. High-fiber foods include: °? Whole grain cereals and  breads. °? Brown rice. °? Beans. °? Fresh fruits and vegetables. °Activity °· Return to your normal activities as told by your health care provider. Ask your health care provider what activities are safe for you. °· Rest as much as possible. Try to rest or take a nap when your baby is sleeping. °· Do not lift anything that is heavier than your baby or 10 lb (4.5 kg) until your health care provider says that it is safe. °· Talk with your health care provider about when you can engage in sexual activity. This may depend on your: °? Risk of infection. °? Rate of healing. °? Comfort and desire to engage in sexual activity. °Vaginal Care °· If you have an episiotomy or a vaginal tear, check the area every day for signs of infection. Check for: °? More redness, swelling, or pain. °? More fluid or blood. °? Warmth. °? Pus or a bad smell. °· Do not use tampons or douches until your health care provider says this is safe. °· Watch for any blood clots that may pass from your vagina. These may look like clumps of dark red, brown, or black discharge. °General instructions °· Keep your perineum clean and dry as told by your health care provider. °· Wear loose, comfortable clothing. °· Wipe from front to back when you use the toilet. °· Ask your health care provider if you can shower or take a bath. If you had an episiotomy or a perineal tear during labor and delivery, your health care provider may tell you not to take baths for a certain length of time. °· Wear a bra that supports your breasts and fits you well. °· If possible, have someone help you with household activities and help care for your baby for at least a few days after you leave the hospital. °· Keep all follow-up visits for you and your baby as told by your health care provider. This is important. °Contact a health care provider if: °· You have: °? Vaginal discharge that has a bad smell. °? Difficulty urinating. °? Pain when urinating. °? A sudden increase or  decrease in the frequency of your bowel movements. °? More redness, swelling, or pain around your episiotomy or vaginal tear. °? More fluid or blood coming from your episiotomy or vaginal tear. °? Pus or a bad smell coming from your episiotomy or vaginal tear. °? A fever. °? A rash. °? Little or no interest in activities you used to enjoy. °? Questions about caring for yourself or your baby. °· Your episiotomy or vaginal tear feels warm to the touch. °· Your episiotomy or vaginal tear is separating or does not appear to be healing. °· Your breasts are painful, hard, or turn red. °· You feel unusually sad or worried. °· You feel nauseous or you vomit. °· You pass large blood clots from your vagina. If you pass a blood clot from your vagina, save it to show to your   health care provider. Do not flush blood clots down the toilet without having your health care provider look at them. °· You urinate more than usual. °· You are dizzy or light-headed. °· You have not breastfed at all and you have not had a menstrual period for 12 weeks after delivery. °· You have stopped breastfeeding and you have not had a menstrual period for 12 weeks after you stopped breastfeeding. °Get help right away if: °· You have: °? Pain that does not go away or does not get better with medicine. °? Chest pain. °? Difficulty breathing. °? Blurred vision or spots in your vision. °? Thoughts about hurting yourself or your baby. °· You develop pain in your abdomen or in one of your legs. °· You develop a severe headache. °· You faint. °· You bleed from your vagina so much that you fill two sanitary pads in one hour. °This information is not intended to replace advice given to you by your health care provider. Make sure you discuss any questions you have with your health care provider. °Document Released: 04/14/2000 Document Revised: 09/29/2015 Document Reviewed: 05/02/2015 °[employer] Interactive Patient Education © 2018 [employer] ° °

## 2017-11-30 NOTE — Progress Notes (Signed)
CSW's initial meeting with [name] Young was on 7/31 at approximately 11:30am.  CSW met with [name] Young multiple times throughout her admission from initial visit until her discharge today to offer support and complete assessment due to adoption plans for newborn as well as hx of depression.  [name] Young is known to CSW from her prior deliveries.   On initial visit, [name] Young appeared as though she had been crying, as she had tear stains on her face.  She denied being emotional and stated that she had just had an "allergic reaction" and the tears must have been from that.  [name] Young stated that she remembered CSW from past admissions and was glad to see CSW.  CSW acknowledged that she was on magnesium and that this medication can be mind-altering so CSW did not plan to have a long conversation with her at this time, but wanted to make sure she knew CSW was aware of her delivery and was here for support when she was ready.  [name] Young welcomed CSW into the room and provided permission for [name] Young/LCSW-A to shadow as she was orienting with CSW today.  [name] Young spoke at length about her current marital problems.  CSW provided supportive brief counseling, of which [name] Young was very receptive.  [name] Young explained to CSW that her husband/[name] Young wanted her to abort this pregnancy and stated that she felt "pressured" by him.  She states she could not do that, so she and [name] Young just stopped talking about the pregnancy.  When it was apparent that she was still pregnant, he acknowledged the fact that she had not had an abortion and she informed him that they would be making another adoption plan.  She told CSW, "I thought that I was calling the same agency (as the first adoption in 2016)," explaining what a positive experience she had, but added that "after meeting with [name] Young at [facility], I realized it was not the same agency."  She stated as long as baby would be cared for and she hadn't aborted her, she said it would be "fine."  She told CSW that [name] Young had found a  family for her, but that they wanted a closed adoption.  [name] Young told CSW that she did not want a closed adoption because she didn't think it was right that [name] Young (her daughter who was adopted at birth-2016) would have access to her birth family if she chooses in the future, but that this baby would not.  She again concluded, that it would be "fine."  It was evident that [name] Young was not satisfied with her plan, but that she was not able to advocate for her true wishes and presents with symptoms of depression that are clearly affecting her thinking.  The conversation ended quickly when [name] Young became ill feeling again and CSW went immediately to get her RN.  CSW stated to [name] Young and RN that [name] Young should not sign relinquishment paperwork today given that she has been sick and on magnesium.  We were all in agreement. At approximately 4:30pm on 7/31, CSW saw [name] Young holding baby skin to skin in NICU and asked how she was feeling.  She stated she was feeling much better than earlier today and acknowledged that she recalled meeting with CSW this morning and remembered the conversation.  CSW asked when the agency representative would be coming for her to sign papers.  She stated that she thought [name] Young would be at the hospital around noon on 8/1.  CSW asked to have a conversation to process [name] Young's  feelings with her regarding the adoption plan prior to her signing, now that she is off magnesium.  [name] Young stated that we could talk now at baby's bedside.  CSW stated no desire to change her plan if she is confident in it or make things more complicated than they already are in an adoption plan, but that CSW wants to ensure that [name] Young is making an informed decision and one that she will feel good about for years to come.  [name] Young agreed and indicated that she wanted CSW to continue.  CSW said to [name] Young, "have you considered placing this baby with [name] Young and her adoptive parents."  [name] Young said, "yes, but I didn't know how to ask."  She again stated that she  thought she was calling the same agency as the first time, but did not think that she could reach out to [name] Young parents once she became involved with [facility], since they were a different agency than the adoption with [name] Young.  CSW told [name] Young that CSW felt comfortable asking the agency/[facility] if they would reach out to [name] Young adoptive parents to see if they would like to adopt this baby as well so that siblings could be together, noting that [name] Young and FOB would have to be okay with the fact that the adoptive parents may not be in the position to adopt another baby at this point and with such short notice.  CSW explained to [name] Young that she should not feel obligated to inquire about this possibility and that if she feels confident and content with her current adoption plan, that she should continue with that, but that if she would like to evaluate this as an option, CSW is here to support her.  She was extremely thankful and stated that she and her husband would like this baby to be with her sister and grow up together.  CSW suggested that [name] Young talk with her husband about this tonight and that CSW would speak with the agency to see if they thought the adoptive parents would be open to adoption at this time.  CSW said that if in the morning, [name] Young and FOB would like to have the agency reach out to the adoptive parents, CSW would move forward with having the agency call them.  CSW also stated that [name] Young was of course free to make these calls if should wanted to.  She said she did not know how to thank this family for taking [name] Young and also ask if they wanted another baby.  CSW feels she may be feeling some shame and embarrassment, but clearly indicated to CSW that she would like to know if [name] Young adoptive parents would be willing to adopt infant also.  [name] Young provided permission to speak with representative from [name] Young.  CSW spoke with Maurine Cane who states she  definitely thinks the adoptive parents/Shawna and [name] Young would be open to adopting this baby and stated that she would await [name] Young and FOB's decision before reaching out to them.   On 8/1, CSW met with [name] Young in her third floor room.  [name] Young informed CSW that she and [name] Young had spoken about the possibility of [name] Young and [name] Young adopting this baby and that this is absolutely what they would like to see happen if they are open to it, but that she "feels bad" about letting down the first agency and the family associated with that agency.  CSW explained to [name] Young that she and FOB need to feel at peace with  their plan and make a decision to place their baby with the family they feel most comfortable with.  CSW asked her to think about the "big picture" and what is most important right now, which is her, her husband and her daughters.  [name] Young asked CSW to have [name] Young reach out to [name] Young.  [name] Young quickly returned CSW's call stating that [name] Young and [name] Young have agreed to pursue adoption on infant without hesitation and are thrilled that they would be raising siblings together.  When CSW informed [name] Young of this, she beamed with happiness and stated that she didn't know this was possible.  She thanked CSW, but stated she still felt bad about the other agency and didn't know what to say to [name] Young with [name] Young.  CSW offered support and suggested that she thank [name] Young for all that she has done during this time and inform [name] Young that her ([name] Young) plans have changed and that [name] Young does not need to come to the hospital today.  [name] Young agreed that this was how she was feeling, but first wanted to talk directly with [facility].  [name] Young asked that CSW called [name] Young on speaker phone so that the three of [place] could discuss this new plan.  CSW did so at [facility] request.  [name] Young smiled the entire time and stated her gratitude.  [name] Young also provided support to [name] Young in letting down the other agency representative, explaining that as an [employer]  herself, she can attest to how often adoption plans change.  As soon as the call ended, [name] Young entered [name] Young's room.  [name] Young immediately said, "sorry, but [name] Young and [name] Young are going to care for the baby."  [name] Young replied in a forceful, angry tone, "who called them?"  She looked at [name] Young and stated this again.  She then looked at [name] Young and said, "who are you?  Are you [name] Young?  Did you call them."  CSW replied, "I don't think you need to know who called them."  [name] Young became very confrontational and said, "this was not her ([name] Young) plan when I talked to her this morning.  Did you call the agency?"  CSW agreed to calling the agency.  She replied, "then I worry about HIPAA."  CSW replied, "I assure you that HIPAA was not violated."  She looked at [name] Young then back to CSW and said, "do they have a home study?"  CSW began to say that she did not need to know this information either, but instead looked to [name] Young to see if she felt she needed to talk with [name] Young privately or if she wanted [name] Young to leave.  [name] Young stated she wanted to talk with [facility] privately.  CSW left the room, but remained on the third floor to monitor.  After approximately 15-20 minutes, CSW noted that [name] Young had not come out of [name] Young's room and CSW asked RN to go in and check on [name] Young, and take her blood pressure.  RN reported back to CSW that [name] Young was visibly upset and that the woman in the room with her seemed to be interrogating her.  CSW called in to [name] Young's room and said, "just say yes or no.  Do you want her out of there?"  She was sobbing and replied, "I'm so flustered and discombobulated.  Can you come back in here?"  CSW returned to the room and was accused of being unprofessional and crossing boundaries by [name] Young/owner of [name] Young.  She stated that CSW should have called her first and  that [name] Young wanted a closed adoption and therefore they had already determined that baby would not be placed with [name] Young and [name] Young.  CSW calmly told [name] Young that no outside  agency can tell CSW when CSW is able to talk with patients in this hospital and what CSW is able to talk with them about.  CSW reminded [name] Young that CSW is the neutral party here and has nothing to gain with either plan that [name] Young makes and only concern here is that all options have been evaluated and [name] Young and FOB are content with their final decision.  [name] Young continued to berate CSW about calling the agency and parents without [name] Young's involvement.  [name] Young was shaking and sobbing.  CSW informed [name] Young that she needed to step out of the room because CSW could not allow [name] Young to be this upset.  She hesitated, but then got up and left the room.  She quickly re-entered stating that she left her phone.  She left again.  CSW asked [name] Young if she wanted [name] Young to stay out or return.  She told CSW that she did not want her to return, but that she felt awful and said [name] Young had made her feel that she was doing something wrong.  She said, "I just want to be happy that this baby is going to be with her sister and [name] Young and [name] Young because I am, but I can't."  CSW assured her that her decision is only about her and her family and not about any adoption agency.  CSW apologized that she was going through this and encouraged her to allow herself to focus on the happy.  At this time, FOB arrived with their 63 and 17 year old daughters.  FOB was angered at how upset [name] Young was.  CSW was informed by RN that adoption agency representative had called House Coverage RN to report CSW.  CSW called House Coverage RN in to [name] Young's room to inform her of what just happened and ask her to ensure that [name] Young leave the premises and not return.  [facility] Coverage RN ensured that this was what [name] Young and FOB wanted and they emphatically confirmed.  [name] Young and FOB called [name] Young and was able to speak with him about the adoption.  Parents stated that they would like to sign relinquishment papers today at 3pm.  CSW contacted Maurine Cane with [name] Young who states  she can be here at that time.  CSW remained with parents to continue to offer support and counseling while [name] Young calmed down.  CSW continued conversation from before about importance of counseling after adoption, especially given symptoms of depression and stress family is under.  [name] Young is very willing and wants to start counseling.  FOB is supportive, but did not commit to attending also.  CSW discussed antidepressant medication with [name] Young and she states she felt like a "zombie" on Zoloft, but with further discussion, it appears she felt emotionless and was possibly sleeping a lot because of her depression.  She stated she only took the medication for about 2-3 weeks.  CSW discussed the benefits and informed that there are many different medications.  CSW recommends starting a different SSRI to helps with symptoms, coupled with counseling.  CSW asked that [name] Young give the medication at least 2 months to take effect, unless obvious negative side effects occur.  [name] Young stated agreement.  CSW spoke with OB about this and asked that he speak with [name] Young to make a recommendation on what medication to try.  CSW spoke with Ms. Hancock regarding a counselor she recommends for post adoption counseling.  She states plans to refer [name] Young to Reita Chard, LCSW.   Adoption paperwork was completed and notarized and given to RN.    On the morning of 8/2, CSW checked in with [name] Young to see how she was doing.  [name] Young appeared to be in good spirits, was calm and said she felt so much better.  She states she got a good night sleep last night.  She states she feels so content with her plan for baby to be adopted by [name] Young and [name] Young and grow up with her sister.  She states such gratitude for this possibility.  She states the adoptive parents should be here Wednesday and that she and her family will get to see [name] Young through this process as [name] Young and [name] Young plan to bring her to visit.  [name] Young thanked CSW over and over.  CSW thanked [name] Young for allowing CSW to support  her.  She states no further needs at this time and took CSW's number in case she has questions or needs in the future.  She states she may visit until [name] Young arrive.  She and [name] Young are on the visitation form in the NICU, however, this form will need to change once adoptive parents arrive.   All paperwork has been obtained and filed.  Representatives from [name] Young should be called for any consent for baby at this time through discharge.  Maurine Cane can be reached at [phone].

## 2017-11-30 NOTE — Discharge Summary (Signed)
OB Discharge Summary     Patient Name: [name] Young DOB: [birth year] MRN: [id number]  Date of admission: 11/26/2017 Delivering MD:     Date of discharge: 11/30/2017  Admitting diagnosis: BP,HEART RATE Intrauterine pregnancy: [redacted]w[redacted]d     Secondary diagnosis:  Active Problems:   History of cesarean delivery   Severe preeclampsia, third trimester  Additional problems:      Discharge diagnosis: Preterm Pregnancy Delivered and Preeclampsia (severe)                                                                                                Post partum procedures:none  Augmentation: none  Complications: None  Hospital course:  Sceduled C/S   27 y.o. yo [phone] at [redacted]w[redacted]d was admitted to the hospital 11/26/2017 for evaluation of severe preeclampsia with severe features. On hospital day #1, the patient's creatinine was increased to 1.16. She was taken for cesarean section with the following indication:prior cesarean section and severe preeclampsia.  Membrane Rupture Time/Date: 1:18 PM ,11/27/2017   Patient delivered a Viable infant.11/27/2017  Details of operation can be found in separate operative note. She remained on magnesium for 24 hours postpartum and started on vasotec. Patient subsequently had angioedema, which was treated with Solumedrol, pepcid, and benadryl. She was transitioned to procardia and HCTZ. She is ambulating, tolerating a regular diet, passing flatus, and urinating well. Patient is discharged home in stable condition on  11/30/17         Physical exam  Vitals:   11/29/17 2322 11/30/17 0332 11/30/17 0425 11/30/17 0747  BP: (!) 139/97 (!) 142/101 (!) 148/88 (!) 140/106  Pulse: 73 (!) 55 (!) 58 61  Resp: 18 18  17   Temp: 98.7 F (37.1 C) 98.4 F (36.9 C)  98.6 F (37 C)  TempSrc: Oral Oral  Oral  SpO2: 99% 100%  100%  Weight:      Height:       General: alert, cooperative and no distress Lochia: appropriate Uterine Fundus: firm Incision: Healing well with no  significant drainage, No significant erythema, Dressing is clean, dry, and intact DVT Evaluation: No evidence of DVT seen on physical exam. Negative Homan's sign. Labs: Lab Results  Component Value Date   WBC 15.9 (H) 11/28/2017   HGB 11.4 (L) 11/28/2017   HCT 34.1 (L) 11/28/2017   MCV 83.0 11/28/2017   PLT 235 11/28/2017   CMP Latest Ref Rng & Units 11/28/2017  Glucose 70 - 99 mg/dL 272(Z)  BUN 6 - 20 mg/dL 17  Creatinine 3.66 - 4.40 mg/dL 3.47(Q)  Sodium 259 - 563 mmol/L 133(L)  Potassium 3.5 - 5.1 mmol/L 5.4(H)  Chloride 98 - 111 mmol/L 101  CO2 22 - 32 mmol/L 23  Calcium 8.9 - 10.3 mg/dL 7.8(L)  Total Protein 6.5 - 8.1 g/dL -  Total Bilirubin 0.3 - 1.2 mg/dL -  Alkaline Phos 38 - 875 U/L -  AST 15 - 41 U/L -  ALT 0 - 44 U/L -    Discharge instruction: per After Visit Summary and "Baby and Me Booklet".  After visit meds:  Allergies as  of 11/30/2017      Reactions   Ace Inhibitors Swelling      Medication List    STOP taking these medications   aspirin 325 MG tablet   aspirin 81 MG chewable tablet     TAKE these medications   citalopram 20 MG tablet Commonly known as:  CELEXA Take 1 tablet (20 mg total) by mouth daily.   hydrochlorothiazide 25 MG tablet Commonly known as:  HYDRODIURIL Take 1 tablet (25 mg total) by mouth daily. Start taking on:  12/01/2017   ibuprofen 600 MG tablet Commonly known as:  ADVIL,MOTRIN Take 1 tablet (600 mg total) by mouth every 6 (six) hours.   NIFEdipine 60 MG 24 hr tablet Commonly known as:  PROCARDIA-XL/ADALAT CC Take 1 tablet (60 mg total) by mouth daily. Start taking on:  12/01/2017   oxyCODONE-acetaminophen 5-325 MG tablet Commonly known as:  PERCOCET/ROXICET Take 1 tablet by mouth every 4 (four) hours as needed (pain scale 4-7).   prenatal multivitamin Tabs tablet Take 1 tablet by mouth daily at 12 noon.   senna-docusate 8.6-50 MG tablet Commonly known as:  Senokot-S Take 2 tablets by mouth daily. Start taking  on:  12/01/2017       Diet: routine diet  Activity: Advance as tolerated. Pelvic rest for 6 weeks.   Outpatient follow up:1 week for BP check Follow up Appt: Future Appointments  Date Time Provider Department Center  12/26/2017  9:15 AM Brock BadHarper, Charles A, MD [facility] None   Follow up Visit:No follow-ups on file.  Postpartum contraception: Nexplanon  Newborn Data: Live born female  Birth Weight: 3 lb 7.4 oz (1570 g) APGAR: 7, 9  Newborn Delivery   Birth date/time:  11/27/2017 13:19:00 Delivery type:  C-Section, Low Transverse Trial of labor:  No C-section categorization:  Repeat     Baby Feeding: none Disposition:NICU   11/30/2017 Levie HeritageJacob J Kearra Calkin, DO

## 2017-12-04 ENCOUNTER — Encounter (HOSPITAL_COMMUNITY): Payer: Self-pay | Admitting: *Deleted

## 2017-12-26 ENCOUNTER — Ambulatory Visit: Payer: Medicaid Other | Admitting: Obstetrics

## 2018-02-26 ENCOUNTER — Ambulatory Visit (INDEPENDENT_AMBULATORY_CARE_PROVIDER_SITE_OTHER): Payer: Medicaid Other | Admitting: *Deleted

## 2018-02-26 VITALS — BP 108/69 | HR 106 | Wt 134.0 lb

## 2018-02-26 DIAGNOSIS — Z3042 Encounter for surveillance of injectable contraceptive: Secondary | ICD-10-CM

## 2018-02-26 MED ORDER — MEDROXYPROGESTERONE ACETATE 150 MG/ML IM SUSP
150.0000 mg | Freq: Once | INTRAMUSCULAR | Status: AC
  Administered 2018-02-26: 150 mg via INTRAMUSCULAR

## 2018-02-26 MED ORDER — MEDROXYPROGESTERONE ACETATE 150 MG/ML IM SUSP: 150.0000 mg | INTRAMUSCULAR | 2 refills | Status: DC

## 2018-02-26 NOTE — Progress Notes (Signed)
Pt is in office for Depo injection.  Pt is on time for injection.  Pt was given initial injection upon d/c at [facility].  Pt was not sent a Rx to pharmacy.  Rx was sent today. Pt was given Depo from office supply today.  Pt tolerated injection well. Pt advised to RTO Jan 14-28 for next depo, made aware to bring Depo with her to next visit.  Pt complaints of back pain and decrease in appetite.  Pt states she thought that she may have been sick with body aches and back pain.  Pt states that is has now been consistent for the past two weeks.  Pt advised that she may be seen at ED or by her PCP for back pain.  Pt states understanding.  BP 108/69   Pulse (!) 106   Wt 134 lb (60.8 kg)   BMI 23.74 kg/m   Administrations This Visit    medroxyPROGESTERone (DEPO-PROVERA) injection 150 mg    Admin Date 02/26/2018 Action Given Dose 150 mg Route Intramuscular Administered By Lanney Gins, CMA

## 2018-05-20 ENCOUNTER — Ambulatory Visit (INDEPENDENT_AMBULATORY_CARE_PROVIDER_SITE_OTHER): Payer: Medicaid Other

## 2018-05-20 DIAGNOSIS — Z3042 Encounter for surveillance of injectable contraceptive: Secondary | ICD-10-CM | POA: Diagnosis not present

## 2018-05-20 MED ORDER — MEDROXYPROGESTERONE ACETATE 150 MG/ML IM SUSP
150.0000 mg | Freq: Once | INTRAMUSCULAR | Status: AC
  Administered 2018-05-20: 150 mg via INTRAMUSCULAR

## 2018-05-20 NOTE — Progress Notes (Signed)
I have reviewed the chart and agree with nursing staff's documentation of this patient's encounter.  Catalina Antigua, MD 05/20/2018 10:22 AM

## 2018-05-20 NOTE — Progress Notes (Signed)
Nurse visit for pt supply Depo. Pt is on time for injection. Depo given LUOQ w/o difficulty. Next Depo due 4/7-4/21, pt agrees.

## 2018-05-22 ENCOUNTER — Ambulatory Visit: Payer: Medicaid Other | Admitting: Obstetrics & Gynecology

## 2018-08-19 ENCOUNTER — Ambulatory Visit: Payer: Medicaid Other

## 2018-08-20 ENCOUNTER — Other Ambulatory Visit: Payer: Self-pay

## 2018-08-20 ENCOUNTER — Ambulatory Visit (INDEPENDENT_AMBULATORY_CARE_PROVIDER_SITE_OTHER): Payer: Medicaid Other

## 2018-08-20 ENCOUNTER — Ambulatory Visit: Payer: Medicaid Other

## 2018-08-20 VITALS — BP 126/86 | HR 93 | Ht 63.0 in | Wt 140.6 lb

## 2018-08-20 DIAGNOSIS — Z3042 Encounter for surveillance of injectable contraceptive: Secondary | ICD-10-CM

## 2018-08-20 MED ORDER — MEDROXYPROGESTERONE ACETATE 150 MG/ML IM SUSP
150.0000 mg | Freq: Once | INTRAMUSCULAR | Status: AC
  Administered 2018-08-20: 150 mg via INTRAMUSCULAR

## 2018-08-20 NOTE — Progress Notes (Signed)
Presented for DEPO, given in RUOQ, tolerated well.  Next DEPO July 7-21/2020  Administrations This Visit    medroxyPROGESTERone (DEPO-PROVERA) injection 150 mg    Admin Date 08/20/2018 Action Given Dose 150 mg Route Intramuscular Administered By Kattie Santoyo J, RMA         

## 2018-11-13 ENCOUNTER — Ambulatory Visit: Payer: Medicaid Other

## 2018-11-18 ENCOUNTER — Ambulatory Visit: Payer: Medicaid Other

## 2018-11-19 ENCOUNTER — Ambulatory Visit: Payer: Medicaid Other

## 2018-11-26 ENCOUNTER — Telehealth: Payer: Self-pay | Admitting: Obstetrics

## 2018-12-04 ENCOUNTER — Encounter: Payer: Self-pay | Admitting: Obstetrics

## 2018-12-04 ENCOUNTER — Ambulatory Visit (INDEPENDENT_AMBULATORY_CARE_PROVIDER_SITE_OTHER): Payer: Medicaid Other | Admitting: Obstetrics

## 2018-12-04 VITALS — BP 153/107 | HR 88

## 2018-12-04 DIAGNOSIS — I10 Essential (primary) hypertension: Secondary | ICD-10-CM

## 2018-12-04 DIAGNOSIS — Z3009 Encounter for other general counseling and advice on contraception: Secondary | ICD-10-CM

## 2018-12-04 DIAGNOSIS — Z3042 Encounter for surveillance of injectable contraceptive: Secondary | ICD-10-CM

## 2018-12-04 NOTE — Progress Notes (Signed)
TELEHEALTH GYNECOLOGY VIRTUAL VIDEO VISIT ENCOUNTER NOTE  Provider location: Center for [employer] [facility] at [place]   I connected with [name] Young on 12/04/18 at  3:45 PM EDT by WebEx Gyn MyChart Video Encounter at home and verified that I am speaking with the correct person using two identifiers.   I discussed the limitations, risks, security and privacy concerns of performing an evaluation and management service virtually and the availability of in person appointments. I also discussed with the patient that there may be a patient responsible charge related to this service. The patient expressed understanding and agreed to proceed.   History:  [name] Young is a 28 y.o. [phone] female being evaluated today for contraceptive counseling and advice.  Currently on Depo Provera injections for contraception, and she would like something more lon-term. She denies any abnormal vaginal discharge, bleeding, pelvic pain or other concerns.       Past Medical History:  Diagnosis Date  . Depression   . Hx MRSA infection   . Pregnancy induced hypertension    Past Surgical History:  Procedure Laterality Date  . CESAREAN SECTION N/A 02/17/2014   Procedure: CESAREAN SECTION;  Surgeon: Osborne Oman, MD;  Location: [facility];  Service: Obstetrics;  Laterality: N/A;  . CESAREAN SECTION N/A 01/08/2015   Procedure: CESAREAN SECTION;  Surgeon: Osborne Oman, MD;  Location: [facility];  Service: Obstetrics;  Laterality: N/A;  . CESAREAN SECTION N/A 12/05/2016   Procedure: CESAREAN SECTION;  Surgeon: Truett Mainland, DO;  Location: [place];  Service: Obstetrics;  Laterality: N/A;  . CESAREAN SECTION N/A 11/27/2017   Procedure: CESAREAN SECTION;  Surgeon: Truett Mainland, DO;  Location: [place];  Service: Obstetrics;  Laterality: N/A;   The following portions of the patient's history were reviewed and updated as appropriate: allergies, current medications, past family history, past medical  history, past social history, past surgical history and problem list.   Health Maintenance:  Normal pap and negative HRHPV on 11-12-2017.    Review of Systems:  Pertinent items noted in HPI and remainder of comprehensive ROS otherwise negative.  Physical Exam:   General:  Alert, oriented and cooperative. Patient appears to be in no acute distress.  Mental Status: Normal mood and affect. Normal behavior. Normal judgment and thought content.   Respiratory: Normal respiratory effort, no problems with respiration noted  Rest of physical exam deferred due to type of encounter  Labs and Imaging No results found for this or any previous visit (from the past 336 hour(s)). No results found.     Assessment and Plan:     1. Encounter for surveillance of injectable contraceptive - wants to discontinue Depo for a more long-term contraceptive  2. General counselling and advice on contraception - ParaGuard IUD recommended because of HTN, and she agrees.  3. HTN (hypertension), benign - not well controlled - patient will follow up with PCP        I discussed the assessment and treatment plan with the patient. The patient was provided an opportunity to ask questions and all were answered. The patient agreed with the plan and demonstrated an understanding of the instructions.   The patient was advised to call back or seek an in-person evaluation/go to the ED if the symptoms worsen or if the condition fails to improve as anticipated.  I provided 10 minutes of face-to-face time during this encounter.   Baltazar Najjar, MD Center for [facility], [facility] 12-04-2018

## 2018-12-04 NOTE — Progress Notes (Signed)
Pt presents for webex visit. Visit is for a birth control consult. Pt has been on depo inj, but missed her appt last week. Pt states that she does not want to continue on the depo. She is wanting to start something more long term. Her bp today is 153/107. She has not been taking her bp medication, but states that she is going to follow up with her pcp. Pt has no other concerns.

## 2018-12-25 ENCOUNTER — Ambulatory Visit: Payer: Medicaid Other | Admitting: Obstetrics & Gynecology

## 2020-10-18 DIAGNOSIS — U071 COVID-19: Secondary | ICD-10-CM | POA: Diagnosis not present

## 2020-10-18 DIAGNOSIS — J029 Acute pharyngitis, unspecified: Secondary | ICD-10-CM | POA: Diagnosis not present

## 2020-10-18 DIAGNOSIS — R52 Pain, unspecified: Secondary | ICD-10-CM | POA: Diagnosis not present

## 2020-11-02 DIAGNOSIS — N926 Irregular menstruation, unspecified: Secondary | ICD-10-CM | POA: Diagnosis not present

## 2020-11-02 DIAGNOSIS — D649 Anemia, unspecified: Secondary | ICD-10-CM | POA: Diagnosis not present

## 2020-11-02 DIAGNOSIS — F17290 Nicotine dependence, other tobacco product, uncomplicated: Secondary | ICD-10-CM | POA: Diagnosis not present

## 2020-11-02 DIAGNOSIS — I10 Essential (primary) hypertension: Secondary | ICD-10-CM | POA: Diagnosis not present

## 2020-12-18 DIAGNOSIS — F419 Anxiety disorder, unspecified: Secondary | ICD-10-CM | POA: Diagnosis not present

## 2020-12-18 DIAGNOSIS — F332 Major depressive disorder, recurrent severe without psychotic features: Secondary | ICD-10-CM | POA: Diagnosis not present

## 2020-12-18 DIAGNOSIS — R4589 Other symptoms and signs involving emotional state: Secondary | ICD-10-CM | POA: Diagnosis not present

## 2020-12-18 DIAGNOSIS — D649 Anemia, unspecified: Secondary | ICD-10-CM | POA: Diagnosis not present

## 2020-12-18 DIAGNOSIS — F4310 Post-traumatic stress disorder, unspecified: Secondary | ICD-10-CM | POA: Diagnosis not present

## 2020-12-18 DIAGNOSIS — Z743 Need for continuous supervision: Secondary | ICD-10-CM | POA: Diagnosis not present

## 2020-12-18 DIAGNOSIS — Z20822 Contact with and (suspected) exposure to covid-19: Secondary | ICD-10-CM | POA: Diagnosis not present

## 2020-12-18 DIAGNOSIS — Z9114 Patient's other noncompliance with medication regimen: Secondary | ICD-10-CM | POA: Diagnosis not present

## 2020-12-18 DIAGNOSIS — F410 Panic disorder [episodic paroxysmal anxiety] without agoraphobia: Secondary | ICD-10-CM | POA: Diagnosis not present

## 2020-12-18 DIAGNOSIS — R45851 Suicidal ideations: Secondary | ICD-10-CM | POA: Diagnosis not present

## 2020-12-18 DIAGNOSIS — Z818 Family history of other mental and behavioral disorders: Secondary | ICD-10-CM | POA: Diagnosis not present

## 2020-12-18 DIAGNOSIS — I10 Essential (primary) hypertension: Secondary | ICD-10-CM | POA: Diagnosis not present

## 2020-12-18 DIAGNOSIS — Z8249 Family history of ischemic heart disease and other diseases of the circulatory system: Secondary | ICD-10-CM | POA: Diagnosis not present

## 2020-12-18 DIAGNOSIS — Z630 Problems in relationship with spouse or partner: Secondary | ICD-10-CM | POA: Diagnosis not present

## 2020-12-18 DIAGNOSIS — Z79899 Other long term (current) drug therapy: Secondary | ICD-10-CM | POA: Diagnosis not present

## 2020-12-18 DIAGNOSIS — Z9152 Personal history of nonsuicidal self-harm: Secondary | ICD-10-CM | POA: Diagnosis not present

## 2020-12-19 DIAGNOSIS — F332 Major depressive disorder, recurrent severe without psychotic features: Secondary | ICD-10-CM | POA: Diagnosis not present

## 2020-12-20 DIAGNOSIS — F332 Major depressive disorder, recurrent severe without psychotic features: Secondary | ICD-10-CM | POA: Diagnosis not present

## 2020-12-21 DIAGNOSIS — F332 Major depressive disorder, recurrent severe without psychotic features: Secondary | ICD-10-CM | POA: Diagnosis not present

## 2021-01-04 ENCOUNTER — Telehealth: Payer: Self-pay

## 2021-01-04 NOTE — Telephone Encounter (Signed)
..   Medicaid Managed Care   Unsuccessful Outreach Note  01/04/2021 Name: [name] Young MRN: [id number] DOB: [birth year]  Referred by: Patient, No Pcp Per (Inactive) Reason for referral : High Risk Managed Medicaid (I called the patient today to get her scheduled for a phone visit with the [facility] LCSW for a phone visit. She did not answer and her VM was full.)   An unsuccessful telephone outreach was attempted today. The patient was referred to the case management team for assistance with care management and care coordination.   Follow Up Plan: The care management team will reach out to the patient again over the next 5 days.   Weston Settle Care Guide, High Risk Medicaid Managed Care Embedded Care Coordination [facility]  [facility]

## 2021-01-05 ENCOUNTER — Telehealth: Payer: Self-pay

## 2021-01-05 NOTE — Telephone Encounter (Signed)
.   Medicaid Managed Care   Unsuccessful Outreach Note  01/05/2021 Name: [name] Young MRN: [id number] DOB: Jan 08, 1991  Referred by: Patient, No Pcp Per (Inactive) Reason for referral : High Risk Managed Medicaid (I called this patient today at the request of her insurance. Patient needs a phone visit with the MM LCSW. She did not answer and the VM was full.)   A second unsuccessful telephone outreach was attempted today. The patient was referred to the case management team for assistance with care management and care coordination.   Follow Up Plan: The care management team will reach out to the patient again over the next 2 days.   Weston Settle Care Guide, High Risk Medicaid Managed Care Embedded Care Coordination [facility]  [facility]

## 2021-01-12 ENCOUNTER — Ambulatory Visit: Payer: Self-pay

## 2021-01-13 ENCOUNTER — Telehealth: Payer: Self-pay

## 2021-01-13 ENCOUNTER — Telehealth: Payer: Self-pay | Admitting: Licensed Clinical Social Worker

## 2021-01-13 NOTE — Patient Outreach (Signed)
[facility] Care Management  01/13/2021  [name] Young 01-02-1991 [id number]    LCSW completed [facility] outreach attempt today but was unable to reach patient successfully. A HIPPA compliant voice message was left encouraging patient to return call once available. [facility] LCSW received a text message from patient on 01/12/21 at 6:00 pm. LCSW will ask Scheduling Care Guide to reschedule [facility] SW appointment with patient as well.  Dickie La, BSW, MSW, [employer] Managed Medicaid LCSW [facility]  [facility] [email or web address] Phone: [phone]

## 2021-01-13 NOTE — Patient Instructions (Signed)
Allie Dimmer ,   The [facility] Managed Care Team is available to provide assistance to you with your healthcare needs at no cost and as a benefit of your [facility] plan. I'm sorry I was unable to reach you today for our scheduled appointment. Our care guide will call you to reschedule our telephone appointment. Please call me at the number below. I am available to be of assistance to you regarding your healthcare needs. .   Thank you, Dickie La, BSW, MSW, LCSW Managed Medicaid LCSW [facility]  [address] [email or web address] Phone: [phone]

## 2021-01-13 NOTE — Telephone Encounter (Signed)
..   Medicaid Managed Care   Unsuccessful Outreach Note  01/13/2021 Name: [name] Young MRN: [id number] DOB: 1991-01-31  Referred by: Patient, No Pcp Per (Inactive) Reason for referral : High Risk Managed Medicaid (I called this patient today to reschedule her phone visit with the MM LCSW. She was scheduled for 01/12/21 but did not answer when the LCSW called her.)   An unsuccessful telephone outreach was attempted today. The patient was referred to the case management team for assistance with care management and care coordination.   Follow Up Plan: The care management team will reach out to the patient again over the next 7 days.   Weston Settle Care Guide, High Risk Medicaid Managed Care Embedded Care Coordination [facility]  [facility]

## 2021-01-18 ENCOUNTER — Telehealth: Payer: Self-pay

## 2021-01-18 NOTE — Telephone Encounter (Signed)
..   Medicaid Managed Care   Unsuccessful Outreach Note  01/18/2021 Name: [name] Young MRN: [id number] DOB: 05/15/1990  Referred by: Patient, No Pcp Per (Inactive) Reason for referral : High Risk Managed Medicaid (I called the patient today to get her phone visit with the MM LCSW rescheduled. Her VM was full.)   A second unsuccessful telephone outreach was attempted today. The patient was referred to the case management team for assistance with care management and care coordination.   Follow Up Plan: The care management team will reach out to the patient again over the next 7 days.  Weston Settle Care Guide, High Risk Medicaid Managed Care Embedded Care Coordination [facility]  [facility]

## 2021-01-26 ENCOUNTER — Telehealth: Payer: Self-pay

## 2021-01-26 NOTE — Telephone Encounter (Signed)
..   Medicaid Managed Care   Unsuccessful Outreach Note  01/26/2021 Name: [name] Young MRN: [id number] DOB: Feb 13, 1991  Referred by: Patient, No Pcp Per (Inactive) Reason for referral : High Risk Managed Medicaid (I called the patient today to get her phone visit with the MM LCSW rescheduled. I left my name and number on her VM.)   Third unsuccessful telephone outreach was attempted today. The patient was referred to the case management team for assistance with care management and care coordination. The patient's primary care provider has been notified of our unsuccessful attempts to make or maintain contact with the patient. The care management team is pleased to engage with this patient at any time in the future should he/she be interested in assistance from the care management team.   Follow Up Plan: We have been unable to make contact with the patient for follow up. The care management team is available to follow up with the patient after provider conversation with the patient regarding recommendation for care management engagement and subsequent re-referral to the care management team.   Weston Settle Care Guide, High Risk Medicaid Managed Care Embedded Care Coordination [facility]  [facility]

## 2021-07-20 ENCOUNTER — Emergency Department (HOSPITAL_COMMUNITY)
Admission: EM | Admit: 2021-07-20 | Discharge: 2021-07-20 | Disposition: A | Discharge status: Home / Self Care | Payer: Medicaid Other | Attending: Emergency Medicine | Admitting: Emergency Medicine

## 2021-07-20 ENCOUNTER — Other Ambulatory Visit: Payer: Self-pay

## 2021-07-20 ENCOUNTER — Encounter (HOSPITAL_COMMUNITY): Payer: Self-pay

## 2021-07-20 DIAGNOSIS — T7421XA Adult sexual abuse, confirmed, initial encounter: Secondary | ICD-10-CM | POA: Insufficient documentation

## 2021-07-20 DIAGNOSIS — T7621XA Adult sexual abuse, suspected, initial encounter: Secondary | ICD-10-CM | POA: Diagnosis not present
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
## 2021-07-21 ENCOUNTER — Telehealth: Payer: Self-pay

## 2021-07-21 NOTE — Telephone Encounter (Signed)
Transition Care Management Follow-up Telephone Call ?Date of discharge and from where: 07/20/2021 from [facility] ?How have you been since you were released from the hospital? Patient stated that she is feeling okay and did not have any questions or concerns at this time.  ?Any questions or concerns? No ? ?Items Reviewed: ?Did the pt receive and understand the discharge instructions provided? Yes  ?Medications obtained and verified? Yes  ?Other? No  ?Any new allergies since your discharge? No  ?Dietary orders reviewed? No ?Do you have support at home? Yes  ? ?Functional Questionnaire: (I = Independent and D = Dependent) ?ADLs: I ? ?Bathing/Dressing- I ? ?Meal Prep- I ? ?Eating- I ? ?Maintaining continence- I ? ?Transferring/Ambulation- I ? ?Managing Meds- I ? ? ?Follow up appointments reviewed: ? ?PCP Hospital f/u appt confirmed? No   ?Specialist Hospital f/u appt confirmed? No   ?Are transportation arrangements needed? No  ?If their condition worsens, is the pt aware to call PCP or go to the Emergency Dept.? Yes ?Was the patient provided with contact information for the PCP's office or ED? Yes ?Was to pt encouraged to call back with questions or concerns? Yes ? ?

## 2021-08-12 DIAGNOSIS — T7421XA Adult sexual abuse, confirmed, initial encounter: Secondary | ICD-10-CM | POA: Diagnosis not present

## 2021-08-12 DIAGNOSIS — D649 Anemia, unspecified: Secondary | ICD-10-CM | POA: Insufficient documentation

## 2021-08-12 DIAGNOSIS — Z0441 Encounter for examination and observation following alleged adult rape: Secondary | ICD-10-CM | POA: Diagnosis not present

## 2021-08-12 DIAGNOSIS — E876 Hypokalemia: Secondary | ICD-10-CM | POA: Insufficient documentation

## 2021-08-13 ENCOUNTER — Encounter (HOSPITAL_COMMUNITY): Payer: Self-pay | Admitting: Emergency Medicine

## 2021-08-13 ENCOUNTER — Emergency Department (HOSPITAL_COMMUNITY)
Admission: EM | Admit: 2021-08-13 | Discharge: 2021-08-13 | Disposition: A | Discharge status: Home / Self Care | Payer: Medicaid Other | Attending: Emergency Medicine | Admitting: Emergency Medicine

## 2021-08-13 ENCOUNTER — Other Ambulatory Visit: Payer: Self-pay

## 2021-08-13 ENCOUNTER — Ambulatory Visit (HOSPITAL_COMMUNITY)
Admission: EM | Admit: 2021-08-13 | Discharge: 2021-08-13 | Disposition: A | Discharge status: Home / Self Care | Payer: No Typology Code available for payment source | Source: Ambulatory Visit | Attending: Emergency Medicine | Admitting: Emergency Medicine

## 2021-08-13 ENCOUNTER — Other Ambulatory Visit (HOSPITAL_COMMUNITY): Payer: Self-pay

## 2021-08-13 DIAGNOSIS — Z0441 Encounter for examination and observation following alleged adult rape: Secondary | ICD-10-CM | POA: Insufficient documentation

## 2021-08-13 DIAGNOSIS — T7421XA Adult sexual abuse, confirmed, initial encounter: Secondary | ICD-10-CM

## 2021-08-13 DIAGNOSIS — D649 Anemia, unspecified: Secondary | ICD-10-CM

## 2021-08-13 LAB — COMPREHENSIVE METABOLIC PANEL
ALT: 12 U/L (ref 0–44)
AST: 18 U/L (ref 15–41)
Albumin: 3.7 g/dL (ref 3.5–5.0)
Alkaline Phosphatase: 41 U/L (ref 38–126)
Anion gap: 5 (ref 5–15)
BUN: 11 mg/dL (ref 6–20)
CO2: 23 mmol/L (ref 22–32)
Calcium: 8.8 mg/dL — ABNORMAL LOW (ref 8.9–10.3)
Chloride: 110 mmol/L (ref 98–111)
Creatinine, Ser: 0.9 mg/dL (ref 0.44–1.00)
GFR, Estimated: >60 mL/min (ref >60)
Glucose, Bld: 82 mg/dL (ref 70–99)
Potassium: 3.3 mmol/L — ABNORMAL LOW (ref 3.5–5.1)
Sodium: 138 mmol/L (ref 135–145)
Total Bilirubin: 0.4 mg/dL (ref 0.3–1.2)
Total Protein: 6.3 g/dL — ABNORMAL LOW (ref 6.5–8.1)

## 2021-08-13 LAB — RAPID URINE DRUG SCREEN, HOSP PERFORMED
Amphetamines: POSITIVE — AB
Barbiturates: NOT DETECTED
Benzodiazepines: NOT DETECTED
Cocaine: POSITIVE — AB
Opiates: NOT DETECTED
Tetrahydrocannabinol: POSITIVE — AB

## 2021-08-13 LAB — HEPATITIS PANEL, ACUTE
HCV Ab: NONREACTIVE
Hep A IgM: NONREACTIVE
Hep B C IgM: NONREACTIVE
Hepatitis B Surface Ag: NONREACTIVE

## 2021-08-13 LAB — ACETAMINOPHEN LEVEL: Acetaminophen (Tylenol), Serum: <10 ug/mL — ABNORMAL LOW (ref 10–30)

## 2021-08-13 LAB — CBC
HCT: 28.7 % — ABNORMAL LOW (ref 36.0–46.0)
Hemoglobin: 8.6 g/dL — ABNORMAL LOW (ref 12.0–15.0)
MCH: 22.3 pg — ABNORMAL LOW (ref 26.0–34.0)
MCHC: 30 g/dL (ref 30.0–36.0)
MCV: 74.4 fL — ABNORMAL LOW (ref 80.0–100.0)
Platelets: 367 10*3/uL (ref 150–400)
RBC: 3.86 MIL/uL — ABNORMAL LOW (ref 3.87–5.11)
RDW: 15.9 % — ABNORMAL HIGH (ref 11.5–15.5)
WBC: 7.3 10*3/uL (ref 4.0–10.5)
nRBC: 0 % (ref 0.0–0.2)

## 2021-08-13 LAB — I-STAT BETA HCG BLOOD, ED (MC, WL, AP ONLY): I-stat hCG, quantitative: <5 m[IU]/mL (ref <5)

## 2021-08-13 LAB — RAPID HIV SCREEN (HIV 1/2 AB+AG)
HIV 1/2 Antibodies: NONREACTIVE
HIV-1 P24 Antigen - HIV24: NONREACTIVE

## 2021-08-13 LAB — ETHANOL: Alcohol, Ethyl (B): <10 mg/dL (ref <10)

## 2021-08-13 MED ORDER — POTASSIUM CHLORIDE CRYS ER 20 MEQ PO TBCR
20.0000 meq | EXTENDED_RELEASE_TABLET | Freq: Once | ORAL | Status: AC
  Administered 2021-08-13: 20 meq via ORAL
  Filled 2021-08-13: qty 1

## 2021-08-13 MED ORDER — ELVITEG-COBIC-EMTRICIT-TENOFAF 150-150-200-10 MG PO TABS
1.0000 | ORAL_TABLET | Freq: Every day | ORAL | 0 refills | Status: DC
  Filled 2021-08-13: qty 30, 30d supply, fill #0

## 2021-08-13 MED ORDER — PROMETHAZINE HCL 25 MG PO TABS
25.0000 mg | ORAL_TABLET | Freq: Four times a day (QID) | ORAL | Status: DC | PRN
  Administered 2021-08-13: 25 mg via ORAL

## 2021-08-13 MED ORDER — CEFTRIAXONE SODIUM 500 MG IJ SOLR
500.0000 mg | Freq: Once | INTRAMUSCULAR | Status: AC
  Administered 2021-08-13: 500 mg via INTRAMUSCULAR

## 2021-08-13 MED ORDER — METRONIDAZOLE 500 MG PO TABS
2000.0000 mg | ORAL_TABLET | Freq: Once | ORAL | Status: AC
  Administered 2021-08-13: 2000 mg via ORAL

## 2021-08-13 MED ORDER — LIDOCAINE HCL (PF) 1 % IJ SOLN
1.0000 mL | Freq: Once | INTRAMUSCULAR | Status: AC
  Administered 2021-08-13: 1 mL

## 2021-08-13 MED ORDER — AZITHROMYCIN 250 MG PO TABS
1000.0000 mg | ORAL_TABLET | Freq: Once | ORAL | Status: AC
  Administered 2021-08-13: 1000 mg via ORAL

## 2021-08-13 MED ORDER — ULIPRISTAL ACETATE 30 MG PO TABS
30.0000 mg | ORAL_TABLET | Freq: Once | ORAL | Status: AC
  Administered 2021-08-13: 30 mg via ORAL

## 2021-08-13 MED ORDER — ELVITEG-COBIC-EMTRICIT-TENOFAF 150-150-200-10 MG PREPACK
1.0000 | ORAL_TABLET | Freq: Once | ORAL | Status: AC
  Administered 2021-08-13: 1 via ORAL
  Filled 2021-08-13: qty 1
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
## 2021-08-13 NOTE — ED Notes (Signed)
Pt ambulatory to restroom independently, respirations are even and non-labored. Skin is warm, dry and intact. NAD.  ?Pt able to give urine sample  ?
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
## 2021-08-13 NOTE — ED Notes (Signed)
The pt xould not stay awake long enough for the sane nurse to do her job  she attempted but failed .  Another sane nurse  will make another attempt later today,  the pt wakes up but goes right back to sleep ?
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
## 2021-08-13 NOTE — ED Notes (Signed)
SANE RN in room with pt ? ?
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
## 2021-08-14 LAB — RPR: RPR Ser Ql: NONREACTIVE

## 2021-08-15 ENCOUNTER — Telehealth: Payer: Self-pay

## 2021-08-15 NOTE — Telephone Encounter (Signed)
Transition Care Management Follow-up Telephone Call ?Date of discharge and from where: 08/13/2021-[facility]  ?How have you been since you were released from the hospital? Pt stated she is not o ?Any questions or concerns? No ? ?Items Reviewed: ?Did the pt receive and understand the discharge instructions provided? Yes  ?Medications obtained and verified? Yes  ?Other? No  ?Any new allergies since your discharge? No  ?Dietary orders reviewed? No ?Do you have support at home? Yes  ? ?Home Care and Equipment/Supplies: ?Were home health services ordered? not applicable ?If so, what is the name of the agency? N/A  ?Has the agency set up a time to come to the patient's home? not applicable ?Were any new equipment or medical supplies ordered?  No ?What is the name of the medical supply agency? N/A ?Were you able to get the supplies/equipment? not applicable ?Do you have any questions related to the use of the equipment or supplies? No ? ?Functional Questionnaire: (I = Independent and D = Dependent) ?ADLs: I ? ?Bathing/Dressing- I ? ?Meal Prep- I ? ?Eating- I ? ?Maintaining continence- I ? ?Transferring/Ambulation- I ? ?Managing Meds- I ? ?Follow up appointments reviewed: ? ?PCP Hospital f/u appt confirmed? No   ?Specialist Hospital f/u appt confirmed? No   ?Are transportation arrangements needed? No  ?If their condition worsens, is the pt aware to call PCP or go to the Emergency Dept.? Yes ?Was the patient provided with contact information for the PCP's office or ED? Yes ?Was to pt encouraged to call back with questions or concerns? Yes  ?

## 2021-08-16 ENCOUNTER — Other Ambulatory Visit (HOSPITAL_COMMUNITY): Payer: Self-pay

## 2021-08-23 DIAGNOSIS — Z0389 Encounter for observation for other suspected diseases and conditions ruled out: Secondary | ICD-10-CM | POA: Diagnosis not present

## 2021-08-23 DIAGNOSIS — Z113 Encounter for screening for infections with a predominantly sexual mode of transmission: Secondary | ICD-10-CM | POA: Diagnosis not present

## 2021-08-23 DIAGNOSIS — N766 Ulceration of vulva: Secondary | ICD-10-CM | POA: Diagnosis not present

## 2021-08-23 DIAGNOSIS — Z114 Encounter for screening for human immunodeficiency virus [HIV]: Secondary | ICD-10-CM | POA: Diagnosis not present

## 2021-09-30 DIAGNOSIS — Z206 Contact with and (suspected) exposure to human immunodeficiency virus [HIV]: Secondary | ICD-10-CM | POA: Diagnosis not present

## 2021-09-30 DIAGNOSIS — A6000 Herpesviral infection of urogenital system, unspecified: Secondary | ICD-10-CM | POA: Diagnosis not present

## 2022-03-11 ENCOUNTER — Emergency Department (HOSPITAL_COMMUNITY)
Admission: EM | Admit: 2022-03-11 | Discharge: 2022-03-11 | Discharge status: Against Medical Advice | Payer: Medicaid Other

## 2022-03-11 NOTE — ED Notes (Signed)
Called pt x3.

## 2022-04-04 ENCOUNTER — Other Ambulatory Visit: Payer: Self-pay | Admitting: Family Medicine

## 2022-07-13 ENCOUNTER — Emergency Department (HOSPITAL_COMMUNITY)
Admission: EM | Admit: 2022-07-13 | Discharge: 2022-07-13 | Disposition: A | Discharge status: Home / Self Care | Payer: Medicaid Other | Attending: Emergency Medicine | Admitting: Emergency Medicine

## 2022-07-13 DIAGNOSIS — Z658 Other specified problems related to psychosocial circumstances: Secondary | ICD-10-CM | POA: Insufficient documentation

## 2022-07-13 DIAGNOSIS — Z008 Encounter for other general examination: Secondary | ICD-10-CM

## 2022-07-13 NOTE — Discharge Instructions (Addendum)
She continued to have elevated heart rate you should follow-up with a doctor for further medical evaluation

## 2022-07-13 NOTE — ED Provider Notes (Signed)
[facility] Provider Note   CSN: [id number] Arrival date & time: 07/13/22  [id number]     History  No chief complaint on file.   [name] Young is a 32 y.o. female she was [facility] for medical clearance due to elevated heart rates.  Patient states she has been under a lot of stress and thinks that that is why her heart rate was high.  Initial heart rate here 132 however after sitting calmly her heart rate is 97.  She denies heat intolerance, drug use.  She does think she might be a little bit dehydrated.  She denies palpitations, dizziness or racing heart.  HPI     Home Medications Prior to Admission medications   Medication Sig Start Date End Date Taking? Authorizing Provider  citalopram (CELEXA) 20 MG tablet Take 1 tablet (20 mg total) by mouth daily. Patient not taking: Reported on 12/04/2018 11/30/17   Truett Mainland, DO  elvitegravir-cobicistat-emtricitabine-tenofovir (GENVOYA) 150-150-200-10 MG TABS tablet Take 1 tablet by mouth daily with breakfast. 08/13/21   Prosperi, Christian H, PA-C  hydrochlorothiazide (HYDRODIURIL) 25 MG tablet Take 1 tablet (25 mg total) by mouth daily. Patient not taking: Reported on 02/26/2018 12/01/17   Truett Mainland, DO  ibuprofen (ADVIL,MOTRIN) 600 MG tablet Take 1 tablet (600 mg total) by mouth every 6 (six) hours. Patient not taking: Reported on 12/04/2018 11/30/17   Truett Mainland, DO  medroxyPROGESTERone (DEPO-PROVERA) 150 MG/ML injection Inject 1 mL (150 mg total) into the muscle every 3 (three) months. 02/26/18   Shelly Bombard, MD  NIFEdipine (PROCARDIA-XL/ADALAT CC) 60 MG 24 hr tablet Take 1 tablet (60 mg total) by mouth daily. Patient not taking: Reported on 02/26/2018 12/01/17   Truett Mainland, DO  oxyCODONE-acetaminophen (PERCOCET/ROXICET) 5-325 MG tablet Take 1 tablet by mouth every 4 (four) hours as needed (pain scale 4-7). Patient not taking: Reported on 02/26/2018 11/30/17    Truett Mainland, DO  Prenatal Vit-Fe Fumarate-FA (PRENATAL MULTIVITAMIN) TABS tablet Take 1 tablet by mouth daily at 12 noon.    [provider]  senna-docusate (SENOKOT-S) 8.6-50 MG tablet Take 2 tablets by mouth daily. Patient not taking: Reported on 02/26/2018 12/01/17   Truett Mainland, DO      Allergies    Ace inhibitors    Review of Systems   Review of Systems  Physical Exam Updated Vital Signs BP (!) 143/95 (BP Location: Right Arm)   Pulse 97   Temp 98.5 F (36.9 C)   Resp 16   SpO2 100%  Physical Exam Vitals and nursing note reviewed.  Constitutional:      General: She is not in acute distress.    Appearance: She is well-developed. She is not diaphoretic.  HENT:     Head: Normocephalic and atraumatic.     Right Ear: External ear normal.     Left Ear: External ear normal.     Nose: Nose normal.     Mouth/Throat:     Mouth: Mucous membranes are moist.  Eyes:     General: No scleral icterus.    Conjunctiva/sclera: Conjunctivae normal.  Cardiovascular:     Rate and Rhythm: Normal rate and regular rhythm.     Heart sounds: Normal heart sounds. No murmur heard.    No friction rub. No gallop.  Pulmonary:     Effort: Pulmonary effort is normal. No respiratory distress.     Breath sounds: Normal breath sounds.  Abdominal:     General: Bowel sounds are normal. There is no distension.     Palpations: Abdomen is soft. There is no mass.     Tenderness: There is no abdominal tenderness. There is no guarding.  Musculoskeletal:     Cervical back: Normal range of motion.  Skin:    General: Skin is warm and dry.  Neurological:     Mental Status: She is alert and oriented to person, place, and time.  Psychiatric:        Behavior: Behavior normal.     ED Results / Procedures / Treatments   Labs (all labs ordered are listed, but only abnormal results are displayed) Labs Reviewed - No data to display  EKG None  Radiology No results  found.  Procedures Procedures    Medications Ordered in ED Medications - No data to display  ED Course/ Medical Decision Making/ A&P                             Medical Decision Making Patient heart rate normal sinus rhythm at this time.  Did feel she is safe for discharge and may return for plasma donation.  Patient does not wish to have any further workup.  Appropriate for discharge.        Final Clinical Impression(s) / ED Diagnoses Final diagnoses:  Medical clearance for incarceration    Rx / DC Orders ED Discharge Orders     None         Margarita Mail, PA-C 07/13/22 [place], [place], DO 07/13/22 1003

## 2022-09-11 ENCOUNTER — Emergency Department (HOSPITAL_COMMUNITY)
Admission: EM | Admit: 2022-09-11 | Discharge: 2022-09-11 | Disposition: A | Discharge status: Home / Self Care | Payer: Medicaid Other | Attending: Emergency Medicine | Admitting: Emergency Medicine

## 2022-09-11 ENCOUNTER — Encounter (HOSPITAL_COMMUNITY): Payer: Self-pay

## 2022-09-11 ENCOUNTER — Other Ambulatory Visit: Payer: Self-pay

## 2022-09-11 DIAGNOSIS — R1011 Right upper quadrant pain: Secondary | ICD-10-CM | POA: Insufficient documentation

## 2022-09-11 DIAGNOSIS — R109 Unspecified abdominal pain: Secondary | ICD-10-CM | POA: Diagnosis not present

## 2022-09-11 DIAGNOSIS — R1010 Upper abdominal pain, unspecified: Secondary | ICD-10-CM

## 2022-09-11 DIAGNOSIS — R110 Nausea: Secondary | ICD-10-CM | POA: Insufficient documentation

## 2022-09-11 DIAGNOSIS — O26899 Other specified pregnancy related conditions, unspecified trimester: Secondary | ICD-10-CM | POA: Insufficient documentation

## 2022-09-11 DIAGNOSIS — Z3A00 Weeks of gestation of pregnancy not specified: Secondary | ICD-10-CM | POA: Insufficient documentation

## 2022-09-11 DIAGNOSIS — Z3490 Encounter for supervision of normal pregnancy, unspecified, unspecified trimester: Secondary | ICD-10-CM

## 2022-09-11 DIAGNOSIS — Z3A01 Less than 8 weeks gestation of pregnancy: Secondary | ICD-10-CM | POA: Diagnosis not present

## 2022-09-11 DIAGNOSIS — O26891 Other specified pregnancy related conditions, first trimester: Secondary | ICD-10-CM | POA: Diagnosis not present

## 2022-09-11 LAB — CBC WITH DIFFERENTIAL/PLATELET
Abs Immature Granulocytes: 0.02 10*3/uL (ref 0.00–0.07)
Basophils Absolute: 0.1 10*3/uL (ref 0.0–0.1)
Basophils Relative: 1 %
Eosinophils Absolute: 0.1 10*3/uL (ref 0.0–0.5)
Eosinophils Relative: 1 %
HCT: 35.3 % — ABNORMAL LOW (ref 36.0–46.0)
Hemoglobin: 11.3 g/dL — ABNORMAL LOW (ref 12.0–15.0)
Immature Granulocytes: 0 %
Lymphocytes Relative: 31 %
Lymphs Abs: 2.4 10*3/uL (ref 0.7–4.0)
MCH: 27.3 pg (ref 26.0–34.0)
MCHC: 32 g/dL (ref 30.0–36.0)
MCV: 85.3 fL (ref 80.0–100.0)
Monocytes Absolute: 0.4 10*3/uL (ref 0.1–1.0)
Monocytes Relative: 5 %
Neutro Abs: 4.7 10*3/uL (ref 1.7–7.7)
Neutrophils Relative %: 62 %
Platelets: 343 10*3/uL (ref 150–400)
RBC: 4.14 MIL/uL (ref 3.87–5.11)
RDW: 16.3 % — ABNORMAL HIGH (ref 11.5–15.5)
WBC: 7.6 10*3/uL (ref 4.0–10.5)
nRBC: 0 % (ref 0.0–0.2)

## 2022-09-11 LAB — HCG, QUANTITATIVE, PREGNANCY: hCG, Beta Chain, Quant, S: 52716 m[IU]/mL — ABNORMAL HIGH (ref <5)

## 2022-09-11 LAB — COMPREHENSIVE METABOLIC PANEL
ALT: 10 U/L (ref 0–44)
AST: 13 U/L — ABNORMAL LOW (ref 15–41)
Albumin: 3.4 g/dL — ABNORMAL LOW (ref 3.5–5.0)
Alkaline Phosphatase: 36 U/L — ABNORMAL LOW (ref 38–126)
Anion gap: 9 (ref 5–15)
BUN: 5 mg/dL — ABNORMAL LOW (ref 6–20)
CO2: 20 mmol/L — ABNORMAL LOW (ref 22–32)
Calcium: 8.5 mg/dL — ABNORMAL LOW (ref 8.9–10.3)
Chloride: 106 mmol/L (ref 98–111)
Creatinine, Ser: 0.72 mg/dL (ref 0.44–1.00)
GFR, Estimated: >60 mL/min (ref >60)
Glucose, Bld: 77 mg/dL (ref 70–99)
Potassium: 3.4 mmol/L — ABNORMAL LOW (ref 3.5–5.1)
Sodium: 135 mmol/L (ref 135–145)
Total Bilirubin: 0.5 mg/dL (ref 0.3–1.2)
Total Protein: 6 g/dL — ABNORMAL LOW (ref 6.5–8.1)

## 2022-09-11 LAB — URINALYSIS, W/ REFLEX TO CULTURE (INFECTION SUSPECTED)
Bilirubin Urine: NEGATIVE
Glucose, UA: NEGATIVE mg/dL
Ketones, ur: NEGATIVE mg/dL
Nitrite: NEGATIVE
Protein, ur: 30 mg/dL — AB
Specific Gravity, Urine: 1.013 (ref 1.005–1.030)
pH: 5 (ref 5.0–8.0)

## 2022-09-11 LAB — LIPASE, BLOOD: Lipase: 34 U/L (ref 11–51)

## 2022-09-11 MED ORDER — SODIUM CHLORIDE 0.9 % IV BOLUS
500.0000 mL | Freq: Once | INTRAVENOUS | Status: AC
  Administered 2022-09-11: 500 mL via INTRAVENOUS

## 2022-09-11 NOTE — ED Provider Notes (Signed)
[facility] AT [facility] Provider Note   CSN: [id number] Arrival date & time: 09/11/22  2051     History {Add pertinent medical, surgical, social history, OB history to HPI:1} Chief Complaint  Patient presents with   Abdominal Pain    [name] Young is a 32 y.o. female.  He is here with a complaint of upper abdominal pain that started a few hours ago.  She has never had it before.  Associated with some nausea.  She also states she is probably 6 or [redacted] weeks pregnant with a positive home pregnancy test.  Has not had a confirmatory ultrasound yet.  She does endorse a lot of stress and has been yelling a lot.  No fevers chills diarrhea constipation urinary symptoms.  No vaginal bleeding or discharge.  She is G4   Abdominal Pain Pain location:  RUQ Pain quality: aching and cramping   Pain severity:  Moderate Onset quality:  Sudden Timing:  Intermittent Progression:  Unchanged Chronicity:  New Context: previous surgery   Relieved by:  None tried Worsened by:  Nothing Ineffective treatments:  None tried Associated symptoms: nausea   Associated symptoms: no chest pain, no constipation, no cough, no diarrhea, no fever, no shortness of breath, no vaginal bleeding and no vaginal discharge   Risk factors: pregnancy        Home Medications Prior to Admission medications   Medication Sig Start Date End Date Taking? Authorizing Provider  citalopram (CELEXA) 20 MG tablet Take 1 tablet (20 mg total) by mouth daily. Patient not taking: Reported on 12/04/2018 11/30/17   Levie Heritage, DO  elvitegravir-cobicistat-emtricitabine-tenofovir (GENVOYA) 150-150-200-10 MG TABS tablet Take 1 tablet by mouth daily with breakfast. 08/13/21   Prosperi, Christian H, PA-C  hydrochlorothiazide (HYDRODIURIL) 25 MG tablet Take 1 tablet (25 mg total) by mouth daily. Patient not taking: Reported on 02/26/2018 12/01/17   Levie Heritage, DO  ibuprofen (ADVIL,MOTRIN) 600 MG tablet Take 1  tablet (600 mg total) by mouth every 6 (six) hours. Patient not taking: Reported on 12/04/2018 11/30/17   Levie Heritage, DO  medroxyPROGESTERone (DEPO-PROVERA) 150 MG/ML injection Inject 1 mL (150 mg total) into the muscle every 3 (three) months. 02/26/18   Brock Bad, MD  NIFEdipine (PROCARDIA-XL/ADALAT CC) 60 MG 24 hr tablet Take 1 tablet (60 mg total) by mouth daily. Patient not taking: Reported on 02/26/2018 12/01/17   Levie Heritage, DO  oxyCODONE-acetaminophen (PERCOCET/ROXICET) 5-325 MG tablet Take 1 tablet by mouth every 4 (four) hours as needed (pain scale 4-7). Patient not taking: Reported on 02/26/2018 11/30/17   Levie Heritage, DO  Prenatal Vit-Fe Fumarate-FA (PRENATAL MULTIVITAMIN) TABS tablet Take 1 tablet by mouth daily at 12 noon.    [provider]  senna-docusate (SENOKOT-S) 8.6-50 MG tablet Take 2 tablets by mouth daily. Patient not taking: Reported on 02/26/2018 12/01/17   Levie Heritage, DO      Allergies    Ace inhibitors    Review of Systems   Review of Systems  Constitutional:  Negative for fever.  Respiratory:  Negative for cough and shortness of breath.   Cardiovascular:  Negative for chest pain.  Gastrointestinal:  Positive for abdominal pain and nausea. Negative for constipation and diarrhea.  Genitourinary:  Negative for vaginal bleeding and vaginal discharge.    Physical Exam Updated Vital Signs BP (!) 139/106 (BP Location: Right Arm)   Pulse 95   Temp 98.6 F (37 C) (Oral)   Resp  18   Ht 5\' 3"  (1.6 m)   LMP 08/21/2022 (Approximate)   SpO2 100%   BMI 22.67 kg/m  Physical Exam Vitals and nursing note reviewed.  Constitutional:      General: She is not in acute distress.    Appearance: Normal appearance. She is well-developed.  HENT:     Head: Normocephalic and atraumatic.  Eyes:     Conjunctiva/sclera: Conjunctivae normal.  Cardiovascular:     Rate and Rhythm: Normal rate and regular rhythm.     Heart sounds: No murmur  heard. Pulmonary:     Effort: Pulmonary effort is normal. No respiratory distress.     Breath sounds: Normal breath sounds.  Abdominal:     Palpations: Abdomen is soft.     Tenderness: There is no abdominal tenderness. There is no guarding or rebound.  Musculoskeletal:        General: No deformity. Normal range of motion.     Cervical back: Neck supple.     Right lower leg: No edema.     Left lower leg: No edema.  Skin:    General: Skin is warm and dry.     Capillary Refill: Capillary refill takes less than 2 seconds.  Neurological:     General: No focal deficit present.     Mental Status: She is alert.     ED Results / Procedures / Treatments   Labs (all labs ordered are listed, but only abnormal results are displayed) Labs Reviewed  COMPREHENSIVE METABOLIC PANEL - Abnormal; Notable for the following components:      Result Value   Potassium 3.4 (*)    CO2 20 (*)    BUN 5 (*)    Calcium 8.5 (*)    Total Protein 6.0 (*)    Albumin 3.4 (*)    AST 13 (*)    Alkaline Phosphatase 36 (*)    All other components within normal limits  CBC WITH DIFFERENTIAL/PLATELET - Abnormal; Notable for the following components:   Hemoglobin 11.3 (*)    HCT 35.3 (*)    RDW 16.3 (*)    All other components within normal limits  URINALYSIS, W/ REFLEX TO CULTURE (INFECTION SUSPECTED) - Abnormal; Notable for the following components:   APPearance CLOUDY (*)    Hgb urine dipstick SMALL (*)    Protein, ur 30 (*)    Leukocytes,Ua TRACE (*)    Bacteria, UA RARE (*)    All other components within normal limits  LIPASE, BLOOD  HCG, QUANTITATIVE, PREGNANCY    EKG None  Radiology No results found.  Procedures Procedures  {Document cardiac monitor, telemetry assessment procedure when appropriate:1}  Medications Ordered in ED Medications  sodium chloride 0.9 % bolus 500 mL (0 mLs Intravenous Stopped 09/11/22 2145)    ED Course/ Medical Decision Making/ A&P Clinical Course as of  09/11/22 2256  Mon Sep 11, 2022  2243 Did a bedside ultrasound did not show any free fluid.  No definite gallstones identified.  I did see a probable gestational sac on transabdominal.  Will need formal ultrasound [MB]    Clinical Course User Index [MB] Terrilee Files, MD   {   Click here for ABCD2, HEART and other calculatorsREFRESH Note before signing :1}                          Medical Decision Making Amount and/or Complexity of Data Reviewed Labs: ordered.   This patient complains of ***;  this involves an extensive number of treatment Options and is a complaint that carries with it a high risk of complications and morbidity. The differential includes ***  I ordered, reviewed and interpreted labs, which included *** I ordered medication *** and reviewed PMP when indicated. I ordered imaging studies which included *** and I independently    visualized and interpreted imaging which showed *** Additional history obtained from *** Previous records obtained and reviewed *** I consulted *** and discussed lab and imaging findings and discussed disposition.  Cardiac monitoring reviewed, *** Social determinants considered, *** Critical Interventions: ***  After the interventions stated above, I reevaluated the patient and found *** Admission and further testing considered, ***   {Document critical care time when appropriate:1} {Document review of labs and clinical decision tools ie heart score, Chads2Vasc2 etc:1}  {Document your independent review of radiology images, and any outside records:1} {Document your discussion with family members, caretakers, and with consultants:1} {Document social determinants of health affecting pt's care:1} {Document your decision making why or why not admission, treatments were needed:1} Final Clinical Impression(s) / ED Diagnoses Final diagnoses:  None    Rx / DC Orders ED Discharge Orders     None

## 2022-09-11 NOTE — ED Notes (Signed)
Pt resting quietly on cell phone.  States she does want to make appt for ultrasound in am.  Dr Charm Barges notified of this

## 2022-09-11 NOTE — Discharge Instructions (Signed)
You were seen in the emergency department for upper abdominal pain in the setting of an early pregnancy.  You had lab work urinalysis that did not show an obvious explanation for your symptoms.  I did a bedside ultrasound that did not show an obvious cause of your symptoms but you will need a formal ultrasound tomorrow to evaluate the pregnancy and also your gallbladder.  Please call radiology office in the morning to schedule the appointment.  Return to the emergency department if any worsening or concerning symptoms.

## 2022-09-11 NOTE — ED Triage Notes (Addendum)
Pt c/o of ABD pain RUQ, pt states that she started having abdominal pain and cramping after yelling a lot d/t to stress. Pt believes she is 6-[redacted] weeks pregnant. Pt has to schedule ultrasound. No vaginal bleeding. No hx of ectopic pregnancy.

## 2022-09-12 ENCOUNTER — Encounter (HOSPITAL_COMMUNITY): Payer: Self-pay | Admitting: *Deleted

## 2022-09-12 ENCOUNTER — Inpatient Hospital Stay (HOSPITAL_COMMUNITY)
Admission: AD | Admit: 2022-09-12 | Discharge: 2022-09-12 | Disposition: A | Discharge status: Home / Self Care | Payer: Medicaid Other | Attending: Obstetrics & Gynecology | Admitting: Obstetrics & Gynecology

## 2022-09-12 ENCOUNTER — Inpatient Hospital Stay (HOSPITAL_COMMUNITY): Payer: Medicaid Other

## 2022-09-12 DIAGNOSIS — O98311 Other infections with a predominantly sexual mode of transmission complicating pregnancy, first trimester: Secondary | ICD-10-CM | POA: Diagnosis not present

## 2022-09-12 DIAGNOSIS — O26899 Other specified pregnancy related conditions, unspecified trimester: Secondary | ICD-10-CM

## 2022-09-12 DIAGNOSIS — O26891 Other specified pregnancy related conditions, first trimester: Secondary | ICD-10-CM | POA: Insufficient documentation

## 2022-09-12 DIAGNOSIS — R109 Unspecified abdominal pain: Secondary | ICD-10-CM | POA: Insufficient documentation

## 2022-09-12 DIAGNOSIS — Z8614 Personal history of Methicillin resistant Staphylococcus aureus infection: Secondary | ICD-10-CM | POA: Insufficient documentation

## 2022-09-12 DIAGNOSIS — A5901 Trichomonal vulvovaginitis: Secondary | ICD-10-CM | POA: Insufficient documentation

## 2022-09-12 DIAGNOSIS — Z3A01 Less than 8 weeks gestation of pregnancy: Secondary | ICD-10-CM | POA: Diagnosis not present

## 2022-09-12 DIAGNOSIS — Z3491 Encounter for supervision of normal pregnancy, unspecified, first trimester: Secondary | ICD-10-CM

## 2022-09-12 LAB — WET PREP, GENITAL
Clue Cells Wet Prep HPF POC: NONE SEEN
Sperm: NONE SEEN
WBC, Wet Prep HPF POC: <10 (ref <10)
Yeast Wet Prep HPF POC: NONE SEEN

## 2022-09-12 MED ORDER — METRONIDAZOLE 500 MG PO TABS
2000.0000 mg | ORAL_TABLET | Freq: Once | ORAL | Status: AC
  Administered 2022-09-12: 2000 mg via ORAL
  Filled 2022-09-12: qty 4

## 2022-09-12 NOTE — MAU Provider Note (Signed)
History     CSN: [id number]  Arrival date and time: 09/12/22 1044   None     Chief Complaint  Patient presents with   Abdominal Pain   HPI [name] Young is a 32 y.o. [id number] at [redacted]w[redacted]d by LMP who presents to MAU for abdominal pain. She reports she was seen at [facility] last night for RUQ pain. This pain has since subsided but she now reports pain in her mid to lower abdomen on the left side. The pain is intermittent and sharp but sometimes feels crampy. She denies vaginal bleeding but reports increased vaginal discharge that has an odor. No itching, urinary s/s, constipation, diarrhea, or fever. LMP was 07/21/2022.  Patient is planning to go to [facility] for [facility].    OB History     Gravida  5   Para  4   Term  2   Preterm  2   AB  0   Living  4      SAB  0   IAB  0   Ectopic  0   Multiple  0   Live Births  4           Past Medical History:  Diagnosis Date   Depression    Hx MRSA infection    Pregnancy induced hypertension     Past Surgical History:  Procedure Laterality Date   CESAREAN SECTION N/A 02/17/2014   Procedure: CESAREAN SECTION;  Surgeon: Tereso Newcomer, MD;  Location: [facility];  Service: Obstetrics;  Laterality: N/A;   CESAREAN SECTION N/A 01/08/2015   Procedure: CESAREAN SECTION;  Surgeon: Tereso Newcomer, MD;  Location: [facility];  Service: Obstetrics;  Laterality: N/A;   CESAREAN SECTION N/A 12/05/2016   Procedure: CESAREAN SECTION;  Surgeon: Levie Heritage, DO;  Location: [facility] BIRTHING SUITES;  Service: Obstetrics;  Laterality: N/A;   CESAREAN SECTION N/A 11/27/2017   Procedure: CESAREAN SECTION;  Surgeon: Levie Heritage, DO;  Location: [facility] BIRTHING SUITES;  Service: Obstetrics;  Laterality: N/A;    Family History  Problem Relation Age of Onset   Hypertension Mother    Hypertension Father     Social History   Tobacco Use   Smoking status: Never   Smokeless tobacco: Never  Vaping Use   Vaping Use: Never used  Substance Use Topics   Alcohol  use: No   Drug use: No    Allergies:  Allergies  Allergen Reactions   Ace Inhibitors Swelling    No medications prior to admission.   Review of Systems  Gastrointestinal:  Positive for abdominal pain.  Genitourinary:  Positive for vaginal discharge.  All other systems reviewed and are negative.  Physical Exam   Blood pressure 110/74, pulse 64, temperature 98.6 F (37 C), temperature source Oral, resp. rate 18, height 5\' 3"  (1.6 m), weight 58.7 kg, last menstrual period 07/21/2022, SpO2 100 %, unknown if currently breastfeeding.  Physical Exam Vitals and nursing note reviewed.  Constitutional:      General: She is not in acute distress. Cardiovascular:     Rate and Rhythm: Normal rate.  Pulmonary:     Effort: Pulmonary effort is normal.  Abdominal:     Palpations: Abdomen is soft.     Tenderness: There is no abdominal tenderness.  Genitourinary:    Comments: Patient self swabbed Skin:    General: Skin is warm and dry.  Neurological:     General: No focal deficit present.     Mental Status: She is  alert and oriented to person, place, and time.  Psychiatric:        Mood and Affect: Mood normal.        Behavior: Behavior normal.     Results for orders placed or performed during the hospital encounter of 09/12/22 (from the past 24 hour(s))  Wet prep, genital     Status: Abnormal   Collection Time: 09/12/22 11:41 AM   Specimen: PATH Cytology Cervicovaginal Ancillary Only  Result Value Ref Range   Yeast Wet Prep HPF POC NONE SEEN NONE SEEN   Trich, Wet Prep PRESENT (A) NONE SEEN   Clue Cells Wet Prep HPF POC NONE SEEN NONE SEEN   WBC, Wet Prep HPF POC <10 <10   Sperm NONE SEEN     US OB LESS THAN 14 WEEKS WITH OB TRANSVAGINAL  Result Date: 09/12/2022 CLINICAL DATA:  Abdominal pain. EXAM: OBSTETRIC <14 WK [place] AND TRANSVAGINAL OB US TECHNIQUE: Both transabdominal and transvaginal ultrasound examinations were performed for complete evaluation of the gestation as well  as the maternal uterus, adnexal regions, and pelvic cul-de-sac. Transvaginal technique was performed to assess early pregnancy. COMPARISON:  None Available. FINDINGS: Intrauterine gestational sac: Single Yolk sac:  Visualized. Embryo:  Visualized. Cardiac Activity: Visualized. Heart Rate: 133 bpm CRL:  9.1 mm   6 w   6 d                  [place] EDC: 05/02/2023 Subchorionic hemorrhage:  None visualized. Maternal uterus/adnexae: The left ovary measures 3.1 x 1.8 by 2.2 cm. The right ovary measures 3.0 x 1.7 x 2.5 cm. No uterine mass or fibroid. IMPRESSION: 1. Single live intrauterine gestation with crown-rump length of 9.1 mm corresponding to a gestational age of [redacted] weeks and 6 days. EDC based on today's sonogram is 05/02/2023. 2. Ovaries are normal. Electronically Signed   By: Larose Hires D.O.   On: 09/12/2022 13:03     MAU Course  Procedures  MDM Wet prep, GC/CT [place]  Reviewed workup from [facility] last night. Patient was not able to have ultrasound last night due to no sonographer. Wet prep positive for trichomonas. GC/CT pending. US shows SIUP with FHR present. Patient given 2g Flagyl for trichomonas.  Assessment and Plan   1. [redacted] weeks gestation of pregnancy   2. Normal IUP (intrauterine pregnancy) on prenatal ultrasound, first trimester   3. Abdominal pain affecting pregnancy   4. Trichomonal vaginitis during pregnancy in first trimester    - Discharge home in stable condition - Reviewed to abstain from intercourse for 7-14 days. Partner(s) need to be treated - Return precautions given. Return to MAU as needed for new/worsening symptoms - Call [facility] to establish prenatal care  Brand Males, CNM 09/12/2022, 5:14 PM

## 2022-09-12 NOTE — Discharge Instructions (Signed)

## 2022-09-12 NOTE — MAU Note (Signed)
[name] Young is a 32 y.o. at Unknown here in MAU reporting: was [facility].  Knows she is early preg. Was having extreme RUQ pain.     To early to see anything on abd [place]. Couldn't get seen for [place] until the 21st, so partner was scared about ectopic preg.  Today is having quick sharp pains in Left mid, not near as bad, kind of like a period cramp. No pain in lower abd. No bleeding.  LMP: 3/22 Onset of complaint: last few days Pain score: 3 Vitals:   09/12/22 1139  BP: 109/70  Pulse: 64  Resp: 16  Temp: 98.3 F (36.8 C)  SpO2: 100%      Lab orders placed from triage:   Getting vag swabs, collecting urine

## 2022-09-13 LAB — CULTURE, OB URINE: Culture: NO GROWTH

## 2022-09-13 LAB — GC/CHLAMYDIA PROBE AMP (~~LOC~~) NOT AT ARMC
Chlamydia: NEGATIVE
Comment: NEGATIVE
Comment: NORMAL
Neisseria Gonorrhea: NEGATIVE

## 2022-09-19 ENCOUNTER — Ambulatory Visit (HOSPITAL_BASED_OUTPATIENT_CLINIC_OR_DEPARTMENT_OTHER): Admission: RE | Admit: 2022-09-19 | Payer: Self-pay | Source: Ambulatory Visit

## 2022-10-09 ENCOUNTER — Other Ambulatory Visit: Payer: Self-pay | Admitting: Obstetrics and Gynecology

## 2022-10-09 ENCOUNTER — Encounter: Payer: Self-pay | Admitting: Obstetrics and Gynecology

## 2022-10-09 ENCOUNTER — Ambulatory Visit (HOSPITAL_BASED_OUTPATIENT_CLINIC_OR_DEPARTMENT_OTHER)
Admission: RE | Admit: 2022-10-09 | Discharge: 2022-10-09 | Disposition: A | Discharge status: Home / Self Care | Payer: Medicaid Other | Source: Ambulatory Visit | Attending: Obstetrics and Gynecology | Admitting: Obstetrics and Gynecology

## 2022-10-09 ENCOUNTER — Ambulatory Visit (INDEPENDENT_AMBULATORY_CARE_PROVIDER_SITE_OTHER): Payer: Medicaid Other | Admitting: Obstetrics and Gynecology

## 2022-10-09 ENCOUNTER — Ambulatory Visit: Payer: Medicaid Other | Admitting: Emergency Medicine

## 2022-10-09 VITALS — BP 124/86 | HR 79 | Wt 130.0 lb

## 2022-10-09 DIAGNOSIS — Z3480 Encounter for supervision of other normal pregnancy, unspecified trimester: Secondary | ICD-10-CM

## 2022-10-09 DIAGNOSIS — Z3A11 11 weeks gestation of pregnancy: Secondary | ICD-10-CM | POA: Diagnosis not present

## 2022-10-09 DIAGNOSIS — Z3481 Encounter for supervision of other normal pregnancy, first trimester: Secondary | ICD-10-CM

## 2022-10-09 DIAGNOSIS — Z3689 Encounter for other specified antenatal screening: Secondary | ICD-10-CM | POA: Diagnosis not present

## 2022-10-09 DIAGNOSIS — Z3A10 10 weeks gestation of pregnancy: Secondary | ICD-10-CM

## 2022-10-09 MED ORDER — BLOOD PRESSURE CUFF MISC: 1.0000 | 0 refills | Status: AC

## 2022-10-09 NOTE — Progress Notes (Addendum)
New OB Intake  I connected with  Daishia Macconnell on 10/09/22 at  9:00 AM EDT by in person and verified that I am speaking with the correct person using two identifiers. Nurse is located at [facility] and pt is located at [facility].  I discussed the limitations, risks, security and privacy concerns of performing an evaluation and management service by telephone and the availability of in person appointments. I also discussed with the patient that there may be a patient responsible charge related to this service. The patient expressed understanding and agreed to proceed.  I explained I am completing New OB Intake today. We discussed her EDD of 04/27/2023 that is based on LMP of 07/21/2022. Pt is [id number]. I reviewed her allergies, medications, Medical/Surgical/OB history, and appropriate screenings. I informed her of [facility] services. Based on history, this is a/an  pregnancy complicated by hx of pre-e  .   Patient Active Problem List   Diagnosis Date Noted   Severe preeclampsia, third trimester 11/27/2017   History of pre-eclampsia 11/12/2017   History of cesarean delivery 11/12/2017   Status post repeat low transverse cesarean section 12/05/2016   Supervision of normal pregnancy, antepartum 07/27/2016   Vitamin D deficiency 08/24/2013    Concerns addressed today  Delivery Plans:  Plans to deliver at [facility].   MyChart/Babyscripts MyChart access verified. I explained pt will have some visits in office and some virtually. Babyscripts instructions given and order placed. Patient verifies receipt of registration text/e-mail. Account successfully created and app downloaded.  Blood Pressure Cuff  Blood pressure cuff ordered for patient to pick-up from [employer]. Explained after first prenatal appt pt will check weekly and document in Babyscripts.  Weight scale: Patient does / does not  have weight scale. Weight scale ordered for patient to pick up from [employer].   Anatomy US Explained first  scheduled [place] will be around 19 weeks.  Scheduled AFP lab only appointment if [facility] pt for same day as anatomy US.   Labs Discussed Avelina Laine genetic screening with patient. Would like both Panorama and Horizon drawn at new OB visit.Also if interested in genetic testing, tell patient she will need AFP 15-21 weeks to complete genetic testing .Routine prenatal labs needed.  Covid Vaccine Patient has covid vaccine.   Informed patient of [facility] website  and placed link in her AVS.   Social Determinants of Health Food Insecurity: Patient expresses food insecurity. Food Market information given to patient; explained patient may visit at the end of first OB appointment. WIC Referral: Patient is interested in referral to [facility].  Transportation: Patient denies transportation needs. Childcare: Discussed no children allowed at ultrasound appointments. Offered childcare services; patient declines childcare services at this time.  Send link to Pregnancy Navigators   Placed OB Box on problem list and updated  First visit review I reviewed new OB appt with pt. I explained she will have a pelvic exam, ob bloodwork with genetic screening, and PAP smear. Explained pt will be seen by Constant, MD at first visit; encounter routed to appropriate provider. Explained that patient will be seen by pregnancy navigator following visit with provider. [facility] information placed in AVS.   Sharlyne Pacas, RN 10/09/2022  9:16 AM

## 2022-10-09 NOTE — Progress Notes (Signed)
Attestation of visit: Intake and evaluation was performed by the nurse.  I have reviewed the note and the chart, and I agree with the management and plan.   [name] Grates, FNP Center for [employer], [facility]    Pt presents with female guest. States she was sent from [facility] to discuss U/S results. U/S results given, questions answered. Pt informed of upcoming appointments.  Pt given precautions to present to MAU and address given.

## 2022-10-10 LAB — CBC/D/PLT+RPR+RH+ABO+RUBIGG...
Antibody Screen: NEGATIVE
Basophils Absolute: 0 10*3/uL (ref 0.0–0.2)
Basos: 1 %
EOS (ABSOLUTE): 0.1 10*3/uL (ref 0.0–0.4)
Eos: 1 %
HCV Ab: NONREACTIVE
HIV Screen 4th Generation wRfx: NONREACTIVE
Hematocrit: 39.6 % (ref 34.0–46.6)
Hemoglobin: 12.6 g/dL (ref 11.1–15.9)
Hepatitis B Surface Ag: NEGATIVE
Immature Grans (Abs): 0 10*3/uL (ref 0.0–0.1)
Immature Granulocytes: 0 %
Lymphocytes Absolute: 1.5 10*3/uL (ref 0.7–3.1)
Lymphs: 23 %
MCH: 27.5 pg (ref 26.6–33.0)
MCHC: 31.8 g/dL (ref 31.5–35.7)
MCV: 86 fL (ref 79–97)
Monocytes Absolute: 0.4 10*3/uL (ref 0.1–0.9)
Monocytes: 7 %
Neutrophils Absolute: 4.3 10*3/uL (ref 1.4–7.0)
Neutrophils: 68 %
Platelets: 318 10*3/uL (ref 150–450)
RBC: 4.59 x10E6/uL (ref 3.77–5.28)
RDW: 14.3 % (ref 11.7–15.4)
RPR Ser Ql: NONREACTIVE
Rh Factor: NEGATIVE
Rubella Antibodies, IGG: <0.9 index — ABNORMAL LOW (ref >0.99)
WBC: 6.3 10*3/uL (ref 3.4–10.8)

## 2022-10-10 LAB — HCV INTERPRETATION

## 2022-10-16 LAB — PANORAMA PRENATAL TEST FULL PANEL:PANORAMA TEST PLUS 5 ADDITIONAL MICRODELETIONS: FETAL FRACTION: 6.4

## 2022-10-31 ENCOUNTER — Encounter: Payer: Medicaid Other | Admitting: Obstetrics & Gynecology

## 2022-10-31 ENCOUNTER — Institutional Professional Consult (permissible substitution): Payer: Self-pay | Admitting: Licensed Clinical Social Worker

## 2022-10-31 VITALS — BP 125/78 | HR 78 | Wt 135.0 lb

## 2022-10-31 DIAGNOSIS — Z1339 Encounter for screening examination for other mental health and behavioral disorders: Secondary | ICD-10-CM | POA: Diagnosis not present

## 2022-10-31 DIAGNOSIS — Z3480 Encounter for supervision of other normal pregnancy, unspecified trimester: Secondary | ICD-10-CM

## 2022-10-31 NOTE — Progress Notes (Signed)
NOB.  Pt left before being seen, due to a Family Emergency.

## 2022-11-14 DIAGNOSIS — Z113 Encounter for screening for infections with a predominantly sexual mode of transmission: Secondary | ICD-10-CM | POA: Diagnosis not present

## 2022-11-14 DIAGNOSIS — N760 Acute vaginitis: Secondary | ICD-10-CM | POA: Diagnosis not present

## 2022-11-14 DIAGNOSIS — A6000 Herpesviral infection of urogenital system, unspecified: Secondary | ICD-10-CM | POA: Diagnosis not present

## 2022-11-14 DIAGNOSIS — Z114 Encounter for screening for human immunodeficiency virus [HIV]: Secondary | ICD-10-CM | POA: Diagnosis not present

## 2022-11-14 DIAGNOSIS — N766 Ulceration of vulva: Secondary | ICD-10-CM | POA: Diagnosis not present

## 2022-11-14 DIAGNOSIS — Z206 Contact with and (suspected) exposure to human immunodeficiency virus [HIV]: Secondary | ICD-10-CM | POA: Diagnosis not present

## 2022-11-15 NOTE — Progress Notes (Signed)
Patient was assessed and managed by nursing staff during this encounter. I have reviewed the chart and agree with the documentation and plan. I have also made any necessary editorial changes.  Scheryl Darter, MD 11/15/2022 10:19 AM   This encounter was created in error - please disregard.

## 2022-11-16 ENCOUNTER — Encounter: Payer: Medicaid Other | Admitting: Student

## 2022-11-16 ENCOUNTER — Ambulatory Visit (INDEPENDENT_AMBULATORY_CARE_PROVIDER_SITE_OTHER): Payer: Medicaid Other | Admitting: Licensed Clinical Social Worker

## 2022-11-16 DIAGNOSIS — T7431XA Adult psychological abuse, confirmed, initial encounter: Secondary | ICD-10-CM | POA: Diagnosis not present

## 2022-11-16 DIAGNOSIS — Z3A17 17 weeks gestation of pregnancy: Secondary | ICD-10-CM | POA: Diagnosis not present

## 2022-11-16 DIAGNOSIS — O99341 Other mental disorders complicating pregnancy, first trimester: Secondary | ICD-10-CM | POA: Diagnosis not present

## 2022-11-16 DIAGNOSIS — F32A Depression, unspecified: Secondary | ICD-10-CM | POA: Diagnosis not present

## 2022-11-16 NOTE — Progress Notes (Deleted)
History:   [name] Young is a 32 y.o. [id number] at [redacted]w[redacted]d by {Ob dating:14516} being seen today for her first obstetrical visit.  Her obstetrical history is significant for {ob risk factors:10154}. Patient {does/does not:19097} intend to breast feed. Pregnancy history fully reviewed.  Patient reports {sx:14538}.      HISTORY: OB History  Gravida Para Term Preterm AB Living  5 4 2 2  0 4  SAB IAB Ectopic Multiple Live Births  0 0 0 0 4    # Outcome Date GA Lbr Len/2nd Weight Sex Type Anes PTL Lv  5 Current           4 Preterm 11/27/17 [redacted]w[redacted]d  3 lb 7.4 oz (1.57 kg) F CS-LTranv Spinal  LIV     Birth Comments: preterm     Name: Reeder,GIRL Prapti     Apgar1: 7  Apgar5: 9  3 Term 12/05/16 [redacted]w[redacted]d  5 lb 15.4 oz (2.705 kg) F CS-LTranv Spinal  LIV     Name: Droege,GIRL Emelin     Apgar1: 8  Apgar5: 8  2 Term 01/08/15 [redacted]w[redacted]d  5 lb 3.8 oz (2.375 kg) F CS-LTranv Spinal  LIV     Birth Comments: WNL     Name: Musleh,GIRL Sarayu     Apgar1: 9  Apgar5: 9  1 Preterm 02/17/14 [redacted]w[redacted]d   F CS-LTranv Spinal  LIV     Birth Comments: Delivered for NRFHR, severe preeclampsia and worsening renal function     Name: Manzer,GIRL Biridiana    Last pap smear was done *** and was {Normal/Abnormal Appearance:21344::"normal"}  Past Medical History:  Diagnosis Date   Depression    Hx MRSA infection    Pregnancy induced hypertension    Past Surgical History:  Procedure Laterality Date   CESAREAN SECTION N/A 02/17/2014   Procedure: CESAREAN SECTION;  Surgeon: Tereso Newcomer, MD;  Location: [facility];  Service: Obstetrics;  Laterality: N/A;   CESAREAN SECTION N/A 01/08/2015   Procedure: CESAREAN SECTION;  Surgeon: Tereso Newcomer, MD;  Location: [facility];  Service: Obstetrics;  Laterality: N/A;   CESAREAN SECTION N/A 12/05/2016   Procedure: CESAREAN SECTION;  Surgeon: Levie Heritage, DO;  Location: [facility] BIRTHING SUITES;  Service: Obstetrics;  Laterality: N/A;   CESAREAN SECTION N/A 11/27/2017   Procedure: CESAREAN SECTION;   Surgeon: Levie Heritage, DO;  Location: [facility] BIRTHING SUITES;  Service: Obstetrics;  Laterality: N/A;   Family History  Problem Relation Age of Onset   Hypertension Mother    Hypertension Father    Social History   Tobacco Use   Smoking status: Every Day    Types: Cigarettes   Smokeless tobacco: Never  Vaping Use   Vaping status: Never Used  Substance Use Topics   Alcohol use: No   Drug use: No   Allergies  Allergen Reactions   Ace Inhibitors Swelling   Current Outpatient Medications on File Prior to Visit  Medication Sig Dispense Refill   Blood Pressure Monitoring (BLOOD PRESSURE CUFF) MISC 1 Device by Does not apply route once a week. 1 each 0   Prenatal Vit-Fe Fumarate-FA (PRENATAL MULTIVITAMIN) TABS tablet Take 1 tablet by mouth daily at 12 noon.     No current facility-administered medications on file prior to visit.    Review of Systems Pertinent items noted in HPI and remainder of comprehensive ROS otherwise negative. Physical Exam:  There were no vitals filed for this visit.   Uterus:     Pelvic Exam: Perineum: no  hemorrhoids, normal perineum   Vulva: normal external genitalia, no lesions   Vagina:  normal mucosa, normal discharge   Cervix: no lesions and normal, pap smear done.    Adnexa: normal adnexa and no mass, fullness, tenderness   Bony Pelvis: average  System: General: well-developed, well-nourished female in no acute distress   Breasts:  normal appearance, no masses or tenderness bilaterally   Skin: normal coloration and turgor, no rashes   Neurologic: oriented, normal, negative, normal mood   Extremities: normal strength, tone, and muscle mass, ROM of all joints is normal   HEENT PERRLA, extraocular movement intact and sclera clear, anicteric   Mouth/Teeth mucous membranes moist, pharynx normal without lesions and dental hygiene good   Neck supple and no masses   Cardiovascular: regular rate and rhythm   Respiratory:  no respiratory distress,  normal breath sounds   Abdomen: soft, non-tender; bowel sounds normal; no masses,  no organomegaly  ***Bedside Ultrasound for FHR check: Patient informed that the ultrasound is considered a limited obstetric ultrasound and is not intended to be a complete ultrasound exam.  Patient also informed that the ultrasound is not being completed with the intent of assessing for fetal or placental anomalies or any pelvic abnormalities.  Explained that the purpose of today's ultrasound is to assess for fetal heart rate.  Patient acknowledges the purpose of the exam and the limitations of the study.     Assessment:    Pregnancy: [id number] Patient Active Problem List   Diagnosis Date Noted   Supervision of other normal pregnancy, antepartum 10/09/2022   History of severe pre-eclampsia 11/27/2017   History of cesarean delivery 11/12/2017   Status post repeat low transverse cesarean section 12/05/2016   Rubella non-immune status, antepartum 12/24/2014   Vitamin D deficiency 08/24/2013     Plan:    1. Supervision of other normal pregnancy, antepartum ***  2. [redacted] weeks gestation of pregnancy ***  3. History of cesarean delivery - s/p C-sectionx4  4. History of severe pre-eclampsia ***  5. Rubella non-immune status, antepartum - Recommend MMR postpartum  6. History of preterm delivery, currently pregnant - Previous pregnancy at 32 weeks  7. Trichomonal vaginitis during pregnancy in first trimester ***   Initial labs drawn. Continue prenatal vitamins. Genetic Screening discussed, {Blank multiple:19196::"First trimester screen","Quad screen","NIPS"}: {requests/ordered/declines:14581}. Ultrasound discussed; fetal anatomic survey: {requests/ordered/declines:14581}. Problem list reviewed and updated. The nature of [facility] with multiple MDs and other Advanced Practice Providers was explained to patient; also emphasized that residents, students are part of  our team. Routine obstetric precautions reviewed. No follow-ups on file.[email or web address]    Corlis Hove, NP    Faculty Practice Center for [employer], [facility]
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
## 2023-02-04 ENCOUNTER — Inpatient Hospital Stay (HOSPITAL_COMMUNITY)
Admission: AD | Admit: 2023-02-04 | Discharge: 2023-02-04 | Disposition: A | Discharge status: Home / Self Care | Payer: Medicaid Other | Attending: Obstetrics & Gynecology | Admitting: Obstetrics & Gynecology

## 2023-02-04 ENCOUNTER — Other Ambulatory Visit: Payer: Self-pay

## 2023-02-04 ENCOUNTER — Encounter (HOSPITAL_COMMUNITY): Payer: Self-pay | Admitting: Obstetrics & Gynecology

## 2023-02-04 DIAGNOSIS — O0933 Supervision of pregnancy with insufficient antenatal care, third trimester: Secondary | ICD-10-CM | POA: Insufficient documentation

## 2023-02-04 DIAGNOSIS — Z0379 Encounter for other suspected maternal and fetal conditions ruled out: Secondary | ICD-10-CM

## 2023-02-04 DIAGNOSIS — O26893 Other specified pregnancy related conditions, third trimester: Secondary | ICD-10-CM | POA: Insufficient documentation

## 2023-02-04 DIAGNOSIS — Z8759 Personal history of other complications of pregnancy, childbirth and the puerperium: Secondary | ICD-10-CM

## 2023-02-04 DIAGNOSIS — Z3A28 28 weeks gestation of pregnancy: Secondary | ICD-10-CM | POA: Diagnosis not present

## 2023-02-04 LAB — COMPREHENSIVE METABOLIC PANEL
ALT: 13 U/L (ref 0–44)
AST: 18 U/L (ref 15–41)
Albumin: 2.4 g/dL — ABNORMAL LOW (ref 3.5–5.0)
Alkaline Phosphatase: 112 U/L (ref 38–126)
Anion gap: 7 (ref 5–15)
BUN: 5 mg/dL — ABNORMAL LOW (ref 6–20)
CO2: 23 mmol/L (ref 22–32)
Calcium: 8.2 mg/dL — ABNORMAL LOW (ref 8.9–10.3)
Chloride: 105 mmol/L (ref 98–111)
Creatinine, Ser: 0.6 mg/dL (ref 0.44–1.00)
GFR, Estimated: >60 mL/min (ref >60)
Glucose, Bld: 88 mg/dL (ref 70–99)
Potassium: 3.2 mmol/L — ABNORMAL LOW (ref 3.5–5.1)
Sodium: 135 mmol/L (ref 135–145)
Total Bilirubin: 0.4 mg/dL (ref 0.3–1.2)
Total Protein: 5.4 g/dL — ABNORMAL LOW (ref 6.5–8.1)

## 2023-02-04 LAB — URINALYSIS, ROUTINE W REFLEX MICROSCOPIC
Bacteria, UA: NONE SEEN
Bilirubin Urine: NEGATIVE
Glucose, UA: NEGATIVE mg/dL
Ketones, ur: 5 mg/dL — AB
Leukocytes,Ua: NEGATIVE
Nitrite: NEGATIVE
Protein, ur: NEGATIVE mg/dL
Specific Gravity, Urine: 1.01 (ref 1.005–1.030)
pH: 6 (ref 5.0–8.0)

## 2023-02-04 LAB — CBC
HCT: 27.1 % — ABNORMAL LOW (ref 36.0–46.0)
Hemoglobin: 8.7 g/dL — ABNORMAL LOW (ref 12.0–15.0)
MCH: 26.3 pg (ref 26.0–34.0)
MCHC: 32.1 g/dL (ref 30.0–36.0)
MCV: 81.9 fL (ref 80.0–100.0)
Platelets: 276 10*3/uL (ref 150–400)
RBC: 3.31 MIL/uL — ABNORMAL LOW (ref 3.87–5.11)
RDW: 13.3 % (ref 11.5–15.5)
WBC: 6.7 10*3/uL (ref 4.0–10.5)
nRBC: 0 % (ref 0.0–0.2)

## 2023-02-04 LAB — RAPID URINE DRUG SCREEN, HOSP PERFORMED
Amphetamines: NOT DETECTED
Barbiturates: NOT DETECTED
Benzodiazepines: NOT DETECTED
Cocaine: POSITIVE — AB
Opiates: NOT DETECTED
Tetrahydrocannabinol: POSITIVE — AB

## 2023-02-04 LAB — PROTEIN / CREATININE RATIO, URINE
Creatinine, Urine: 113 mg/dL
Protein Creatinine Ratio: 0.09 mg/mg{creat} (ref 0.00–0.15)
Total Protein, Urine: 10 mg/dL

## 2023-02-04 NOTE — MAU Provider Note (Signed)
Chief Complaint  Patient presents with   Hypertension    edema    S: [name] Young  is a 32 y.o. y.o. year old G61P2234 female at [redacted]w[redacted]d weeks gestation who presents to MAU reporting blood pressure of 175/110 on home cuff and swelling.  Hx GHTN. Current blood pressure medication: None. Reported getting prenatal care at [facility] but upon review of chart, she only had nurse intake in June 2024.   ROS: Denies Headache, vision changes, epigastric pain, Contractions, Vaginal bleeding. Fetal movement: Nml  Past Medical History:  Diagnosis Date   Depression    Hx MRSA infection    Pregnancy induced hypertension     Past Surgical History:  Procedure Laterality Date   CESAREAN SECTION N/A 02/17/2014   Procedure: CESAREAN SECTION;  Surgeon: Tereso Newcomer, MD;  Location: [facility] ORS;  Service: Obstetrics;  Laterality: N/A;   CESAREAN SECTION N/A 01/08/2015   Procedure: CESAREAN SECTION;  Surgeon: Tereso Newcomer, MD;  Location: [facility];  Service: Obstetrics;  Laterality: N/A;   CESAREAN SECTION N/A 12/05/2016   Procedure: CESAREAN SECTION;  Surgeon: Levie Heritage, DO;  Location: [facility] BIRTHING SUITES;  Service: Obstetrics;  Laterality: N/A;   CESAREAN SECTION N/A 11/27/2017   Procedure: CESAREAN SECTION;  Surgeon: Levie Heritage, DO;  Location: [facility] BIRTHING SUITES;  Service: Obstetrics;  Laterality: N/A;    Social History   Socioeconomic History   Marital status: Married    Spouse name: Not on file   Number of children: Not on file   Years of education: Not on file   Highest education level: Not on file  Occupational History   Not on file  Tobacco Use   Smoking status: Every Day    Types: Cigarettes   Smokeless tobacco: Never  Vaping Use   Vaping status: Never Used  Substance and Sexual Activity   Alcohol use: Yes   Drug use: Yes    Types: Cocaine   Sexual activity: Yes    Birth control/protection: None  Other Topics Concern   Not on file  Social History Narrative   Not on file    Social Determinants of Health   Financial Resource Strain: Not on file  Food Insecurity: Not on file  Transportation Needs: Not on file  Physical Activity: Not on file  Stress: Not on file  Social Connections: Unknown (09/13/2021)   Received from [facility]   Social Network    Social Network: Not on file  Intimate Partner Violence: Unknown (08/04/2021)   Received from [facility], [facility]   HITS    Physically Hurt: Not on file    Insult or Talk Down To: Not on file    Threaten Physical Harm: Not on file    Scream or Curse: Not on file     O: Patient Vitals for the past 24 hrs:  BP Temp Pulse Resp SpO2 Height Weight  02/04/23 1538 (!) 95/57 -- 78 -- -- -- --  02/04/23 1331 127/80 -- 83 -- -- -- --  02/04/23 1325 117/78 -- 75 -- 100 % -- --  02/04/23 1320 -- -- -- -- 100 % -- --  02/04/23 1315 -- -- -- -- 100 % -- --  02/04/23 1310 -- -- -- -- 100 % -- --  02/04/23 1308 131/84 98.3 F (36.8 C) 86 18 -- 5\' 3"  (1.6 m) 69.5 kg   General: NAD, Very drowsy but arousable. Heart: Regular rate Lungs: Normal rate and effort Abd:  Soft, NT, Gravid, S=D Extremities: No Pedal edema Neuro: 2+ deep tendon reflexes, No clonus. A&O x 3, Grossly nml neuro exam. Nml speech and gait. Symmetric mvmt bilat.  Pelvic: Deferred    EFM: 145, Moderate variability, 10x10 accelerations, no decelerations Toco: UI  Results for orders placed or performed during the hospital encounter of 02/04/23 (from the past 24 hour(s))  Urinalysis, Routine w reflex microscopic -Urine, Clean Catch     Status: Abnormal   Collection Time: 02/04/23  1:23 PM  Result Value Ref Range   Color, Urine YELLOW YELLOW   APPearance CLEAR CLEAR   Specific Gravity, Urine 1.010 1.005 - 1.030   pH 6.0 5.0 - 8.0   Glucose, UA NEGATIVE NEGATIVE mg/dL   Hgb urine dipstick SMALL (A) NEGATIVE   Bilirubin Urine NEGATIVE NEGATIVE   Ketones, ur 5 (A) NEGATIVE mg/dL   Protein, ur NEGATIVE NEGATIVE mg/dL    Nitrite NEGATIVE NEGATIVE   Leukocytes,Ua NEGATIVE NEGATIVE   RBC / HPF 0-5 0 - 5 RBC/hpf   WBC, UA 0-5 0 - 5 WBC/hpf   Bacteria, UA NONE SEEN NONE SEEN   Squamous Epithelial / HPF 0-5 0 - 5 /HPF   Mucus PRESENT   Protein / creatinine ratio, urine     Status: None   Collection Time: 02/04/23  1:23 PM  Result Value Ref Range   Creatinine, Urine 113 mg/dL   Total Protein, Urine 10 mg/dL   Protein Creatinine Ratio 0.09 0.00 - 0.15 mg/mg[Cre]  Rapid urine drug screen (hospital performed)     Status: Abnormal   Collection Time: 02/04/23  1:23 PM  Result Value Ref Range   Opiates NONE DETECTED NONE DETECTED   Cocaine POSITIVE (A) NONE DETECTED   Benzodiazepines NONE DETECTED NONE DETECTED   Amphetamines NONE DETECTED NONE DETECTED   Tetrahydrocannabinol POSITIVE (A) NONE DETECTED   Barbiturates NONE DETECTED NONE DETECTED  CBC     Status: Abnormal   Collection Time: 02/04/23  2:45 PM  Result Value Ref Range   WBC 6.7 4.0 - 10.5 K/uL   RBC 3.31 (L) 3.87 - 5.11 MIL/uL   Hemoglobin 8.7 (L) 12.0 - 15.0 g/dL   HCT 16.1 (L) 09.6 - 04.5 %   MCV 81.9 80.0 - 100.0 fL   MCH 26.3 26.0 - 34.0 pg   MCHC 32.1 30.0 - 36.0 g/dL   RDW 40.9 81.1 - 91.4 %   Platelets 276 150 - 400 K/uL   nRBC 0.0 0.0 - 0.2 %  Comprehensive metabolic panel     Status: Abnormal   Collection Time: 02/04/23  2:45 PM  Result Value Ref Range   Sodium 135 135 - 145 mmol/L   Potassium 3.2 (L) 3.5 - 5.1 mmol/L   Chloride 105 98 - 111 mmol/L   CO2 23 22 - 32 mmol/L   Glucose, Bld 88 70 - 99 mg/dL   BUN 5 (L) 6 - 20 mg/dL   Creatinine, Ser 7.82 0.44 - 1.00 mg/dL   Calcium 8.2 (L) 8.9 - 10.3 mg/dL   Total Protein 5.4 (L) 6.5 - 8.1 g/dL   Albumin 2.4 (L) 3.5 - 5.0 g/dL   AST 18 15 - 41 U/L   ALT 13 0 - 44 U/L   Alkaline Phosphatase 112 38 - 126 U/L   Total Bilirubin 0.4 0.3 - 1.2 mg/dL   GFR, Estimated >95 >62 mL/min   Anion gap 7 5 - 15    MAU Course Orders Placed This Encounter  Procedures  Urinalysis,  Routine w reflex microscopic -Urine, Clean Catch    Standing Status:   Standing    Number of Occurrences:   1    Order Specific Question:   Specimen Source    Answer:   Urine, Clean Catch [76]   CBC    Standing Status:   Standing    Number of Occurrences:   1   Comprehensive metabolic panel    Standing Status:   Standing    Number of Occurrences:   1   Protein / creatinine ratio, urine    Standing Status:   Standing    Number of Occurrences:   1   Rapid urine drug screen (hospital performed)    Standing Status:   Standing    Number of Occurrences:   1   Discharge patient    Order Specific Question:   Discharge disposition    Answer:   01-Home or Self Care [1]    Order Specific Question:   Discharge patient date    Answer:   02/04/2023   No orders of the defined types were placed in this encounter.   MDM Pt less drowsy later in MAU visit. Asked if she felt drowsy or otherwise unlike her usual self. She stated she felt very drowsy since this morning and stayed up late last night. Denies taking any meds or drugs that might cause drowsiness but did admit to a history of polysubstance use. Asked if anyone gave her anything to take. She states she only drank Gatorade this morning. Obtained verbal consent for UDS which showed cocaine and THC. Encouraged pt to abstain. Offered to either contact [facility] to resume care there or to be referred to [facility] SUD clinic at [facility]. Would like to go to [facility] clinic.   Discussed Hx, labs, exam and excessive drowsiness w/ Dr. Debroah Loop. Agrees w/ POC. New orders: none. Suspects pt is coming down off of cocaine.  No evidence of stroke or other neurological emergency. Has a ride to take her home.   BP and Pre-E labs Nml in MAU. Precautions reviewed especially considering pts Hx GHTN. Suspect HTN on home cuff may have due to cocaine.   A: [redacted]w[redacted]d week IUP 1. Suspected maternal condition not found   2. History of pre-eclampsia   3. [redacted] weeks gestation of pregnancy    4. Limited prenatal care, third trimester    FHR reactive  P: Discharge home in stable condition per consult with Adam Phenix, MD. Referred to [facility] prenatal clinic. Preeclampsia precautions. Return to maternity admissions as needed in emergencies  [place], [place], [place] 02/04/2023 5:37 PM

## 2023-02-04 NOTE — MAU Note (Signed)
.  [name] Young is a 32 y.o. at [redacted]w[redacted]d here in MAU reporting: increased blood pressure at home (175/110) and "retaining fluid" in legs for the past two days. 2+ pitting edema noted in lower extremities. Denies VB. LOF, ctx. Reports adequate fetal movement.   Onset of complaint: 2 days ago Pain score: 3/10 swelling in feet There were no vitals filed for this visit.   FHT:150 Lab orders placed from triage:   UA

## 2023-02-07 NOTE — BH Specialist Note (Deleted)
Integrated Behavioral Health Initial In-Person Visit  MRN: [id number] Name: [name] Young  Number of Integrated Behavioral Health Clinician visits: No data recorded Session Start time: No data recorded   Session End time: No data recorded Total time in minutes: No data recorded  Types of Service: {CHL AMB TYPE OF [phone]}  Interpretor:No. Interpretor Name and Language: n/a   Warm Hand Off Completed.        Subjective: [name] Young is a 32 y.o. female accompanied by {CHL AMB ACCOMPANIED BM:[id number]} Patient was referred by Merian Capron, MD for ***. Patient reports the following symptoms/concerns: *** Duration of problem: ***; Severity of problem: {Mild/Moderate/Severe:20260}  Objective: Mood: {BHH MOOD:22306} and Affect: {BHH AFFECT:22307} Risk of harm to self or others: {CHL AMB BH Suicide Current Mental Status:21022748}  Life Context: Family and Social: *** School/Work: *** Self-Care: *** Life Changes: Current pregnancy***  Patient and/or Family's Strengths/Protective Factors: {CHL AMB BH PROTECTIVE FACTORS:[phone]}  Goals Addressed: Patient will: Reduce symptoms of: {IBH Symptoms:21014056} Increase knowledge and/or ability of: {IBH Patient Tools:21014057}  Demonstrate ability to: {IBH Goals:21014053}  Progress towards Goals: {CHL AMB BH PROGRESS TOWARDS [phone]}  Interventions: Interventions utilized: {IBH Interventions:21014054}  Standardized Assessments completed: {IBH Screening Tools:21014051}  Patient and/or Family Response: ***  Patient Centered Plan: Patient is on the following Treatment Plan(s):  ***  Assessment: Patient currently experiencing ***.   Patient may benefit from ***.  Plan: Follow up with behavioral health clinician on : *** Behavioral recommendations: *** Referral(s): {IBH Referrals:21014055}  Rae Lips, LCSW     10/31/2022   10:38 AM 10/09/2022    9:24 AM 01/06/2015   11:44 AM 08/14/2013    1:21  PM  Depression screen PHQ 2/9  Decreased Interest 1 1 2  0  Down, Depressed, Hopeless 1 1 1  0  PHQ - 2 Score 2 2 3  0  Altered sleeping 1 2 2    Tired, decreased energy 1 2 1    Change in appetite 0 0 2   Feeling bad or failure about yourself  1 1 2    Trouble concentrating 0 0 1   Moving slowly or fidgety/restless 0 1 1   Suicidal thoughts 0 0 0   PHQ-9 Score 5 8 12    Difficult doing work/chores Not difficult at all  Very difficult       10/31/2022   10:40 AM 10/09/2022    9:25 AM  GAD 7 : Generalized Anxiety Score  Nervous, Anxious, on Edge 1 1  Control/stop worrying 1 1  Worry too much - different things 1 1  Trouble relaxing 1 1  Restless 0 1  Easily annoyed or irritable 1 1  Afraid - awful might happen 0 1  Total GAD 7 Score 5 7  Anxiety Difficulty Not difficult at all

## 2023-02-08 ENCOUNTER — Encounter: Payer: Medicaid Other | Admitting: Family Medicine

## 2023-02-08 ENCOUNTER — Telehealth: Payer: Self-pay

## 2023-02-08 NOTE — Telephone Encounter (Signed)
Called pt x 2 regarding missed visit today; VM left offering for patient to come by4:40 PM today or reschedule.

## 2023-03-27 ENCOUNTER — Telehealth: Payer: Medicaid Other | Admitting: Advanced Practice Midwife

## 2023-03-27 DIAGNOSIS — Z98891 History of uterine scar from previous surgery: Secondary | ICD-10-CM

## 2023-03-27 DIAGNOSIS — O0930 Supervision of pregnancy with insufficient antenatal care, unspecified trimester: Secondary | ICD-10-CM | POA: Insufficient documentation

## 2023-03-27 NOTE — Progress Notes (Signed)
Pt called and reported she is unable to come to the office for appt today.  Given that she is 35 weeks with limited prenatal care, appointment was changed to virtual. I called patient at time of appointment but phone number was unable to accept calls.  Notified front desk, and attempt to reach patient again today to complete appointment. Pt is 35 weeks with hx cesarean x 4.  Will schedule cesarean at 39 weeks, and continue to try to reach patient for prenatal visits.

## 2023-03-31 ENCOUNTER — Other Ambulatory Visit: Payer: Self-pay

## 2023-03-31 ENCOUNTER — Inpatient Hospital Stay (HOSPITAL_COMMUNITY): Payer: Medicaid Other | Admitting: Anesthesiology

## 2023-03-31 ENCOUNTER — Inpatient Hospital Stay (HOSPITAL_COMMUNITY)
Admission: AD | Admit: 2023-03-31 | Discharge: 2023-04-02 | DRG: 786 | Disposition: A | Discharge status: Home / Self Care | Payer: Medicaid Other | Attending: Obstetrics and Gynecology | Admitting: Obstetrics and Gynecology

## 2023-03-31 ENCOUNTER — Encounter (HOSPITAL_COMMUNITY): Payer: Self-pay | Admitting: Obstetrics & Gynecology

## 2023-03-31 ENCOUNTER — Encounter (HOSPITAL_COMMUNITY)
Admission: AD | Disposition: A | Discharge status: Home / Self Care | Payer: Self-pay | Source: Home / Self Care | Attending: Obstetrics and Gynecology

## 2023-03-31 DIAGNOSIS — F17210 Nicotine dependence, cigarettes, uncomplicated: Secondary | ICD-10-CM | POA: Diagnosis not present

## 2023-03-31 DIAGNOSIS — Z3A36 36 weeks gestation of pregnancy: Secondary | ICD-10-CM

## 2023-03-31 DIAGNOSIS — Z8614 Personal history of Methicillin resistant Staphylococcus aureus infection: Secondary | ICD-10-CM | POA: Diagnosis not present

## 2023-03-31 DIAGNOSIS — O42913 Preterm premature rupture of membranes, unspecified as to length of time between rupture and onset of labor, third trimester: Secondary | ICD-10-CM | POA: Diagnosis not present

## 2023-03-31 DIAGNOSIS — F32A Depression, unspecified: Secondary | ICD-10-CM | POA: Diagnosis present

## 2023-03-31 DIAGNOSIS — Z8249 Family history of ischemic heart disease and other diseases of the circulatory system: Secondary | ICD-10-CM

## 2023-03-31 DIAGNOSIS — K219 Gastro-esophageal reflux disease without esophagitis: Secondary | ICD-10-CM | POA: Diagnosis not present

## 2023-03-31 DIAGNOSIS — Z6791 Unspecified blood type, Rh negative: Secondary | ICD-10-CM

## 2023-03-31 DIAGNOSIS — Z98891 History of uterine scar from previous surgery: Secondary | ICD-10-CM

## 2023-03-31 DIAGNOSIS — O99344 Other mental disorders complicating childbirth: Secondary | ICD-10-CM | POA: Diagnosis not present

## 2023-03-31 DIAGNOSIS — O42013 Preterm premature rupture of membranes, onset of labor within 24 hours of rupture, third trimester: Secondary | ICD-10-CM | POA: Diagnosis not present

## 2023-03-31 DIAGNOSIS — D62 Acute posthemorrhagic anemia: Secondary | ICD-10-CM | POA: Diagnosis not present

## 2023-03-31 DIAGNOSIS — O9081 Anemia of the puerperium: Secondary | ICD-10-CM | POA: Diagnosis not present

## 2023-03-31 DIAGNOSIS — O99324 Drug use complicating childbirth: Secondary | ICD-10-CM | POA: Diagnosis present

## 2023-03-31 DIAGNOSIS — F1410 Cocaine abuse, uncomplicated: Secondary | ICD-10-CM | POA: Diagnosis present

## 2023-03-31 DIAGNOSIS — O165 Unspecified maternal hypertension, complicating the puerperium: Secondary | ICD-10-CM | POA: Diagnosis not present

## 2023-03-31 DIAGNOSIS — O43813 Placental infarction, third trimester: Secondary | ICD-10-CM | POA: Diagnosis not present

## 2023-03-31 DIAGNOSIS — O34211 Maternal care for low transverse scar from previous cesarean delivery: Secondary | ICD-10-CM

## 2023-03-31 DIAGNOSIS — F419 Anxiety disorder, unspecified: Secondary | ICD-10-CM | POA: Diagnosis not present

## 2023-03-31 DIAGNOSIS — O26893 Other specified pregnancy related conditions, third trimester: Secondary | ICD-10-CM | POA: Diagnosis present

## 2023-03-31 DIAGNOSIS — O34219 Maternal care for unspecified type scar from previous cesarean delivery: Secondary | ICD-10-CM | POA: Diagnosis not present

## 2023-03-31 DIAGNOSIS — O9962 Diseases of the digestive system complicating childbirth: Secondary | ICD-10-CM | POA: Diagnosis not present

## 2023-03-31 DIAGNOSIS — O99334 Smoking (tobacco) complicating childbirth: Secondary | ICD-10-CM | POA: Diagnosis present

## 2023-03-31 DIAGNOSIS — Z23 Encounter for immunization: Secondary | ICD-10-CM

## 2023-03-31 DIAGNOSIS — O1404 Mild to moderate pre-eclampsia, complicating childbirth: Secondary | ICD-10-CM | POA: Diagnosis not present

## 2023-03-31 DIAGNOSIS — O0933 Supervision of pregnancy with insufficient antenatal care, third trimester: Secondary | ICD-10-CM | POA: Diagnosis not present

## 2023-03-31 DIAGNOSIS — O411230 Chorioamnionitis, third trimester, not applicable or unspecified: Secondary | ICD-10-CM | POA: Diagnosis not present

## 2023-03-31 LAB — COMPREHENSIVE METABOLIC PANEL
ALT: 11 U/L (ref 0–44)
AST: 24 U/L (ref 15–41)
Albumin: 2.6 g/dL — ABNORMAL LOW (ref 3.5–5.0)
Alkaline Phosphatase: 337 U/L — ABNORMAL HIGH (ref 38–126)
Anion gap: 9 (ref 5–15)
BUN: 7 mg/dL (ref 6–20)
CO2: 20 mmol/L — ABNORMAL LOW (ref 22–32)
Calcium: 8.7 mg/dL — ABNORMAL LOW (ref 8.9–10.3)
Chloride: 108 mmol/L (ref 98–111)
Creatinine, Ser: 0.88 mg/dL (ref 0.44–1.00)
GFR, Estimated: >60 mL/min (ref >60)
Glucose, Bld: 80 mg/dL (ref 70–99)
Potassium: 3.7 mmol/L (ref 3.5–5.1)
Sodium: 137 mmol/L (ref 135–145)
Total Bilirubin: 0.5 mg/dL (ref <1.2)
Total Protein: 5.8 g/dL — ABNORMAL LOW (ref 6.5–8.1)

## 2023-03-31 LAB — CBC
HCT: 31.6 % — ABNORMAL LOW (ref 36.0–46.0)
Hemoglobin: 9.7 g/dL — ABNORMAL LOW (ref 12.0–15.0)
MCH: 23.7 pg — ABNORMAL LOW (ref 26.0–34.0)
MCHC: 30.7 g/dL (ref 30.0–36.0)
MCV: 77.3 fL — ABNORMAL LOW (ref 80.0–100.0)
Platelets: 335 10*3/uL (ref 150–400)
RBC: 4.09 MIL/uL (ref 3.87–5.11)
RDW: 16.3 % — ABNORMAL HIGH (ref 11.5–15.5)
WBC: 6.2 10*3/uL (ref 4.0–10.5)
nRBC: 0 % (ref 0.0–0.2)

## 2023-03-31 LAB — WET PREP, GENITAL
Clue Cells Wet Prep HPF POC: NONE SEEN
Sperm: NONE SEEN
Trich, Wet Prep: NONE SEEN
WBC, Wet Prep HPF POC: <10 (ref <10)
Yeast Wet Prep HPF POC: NONE SEEN

## 2023-03-31 LAB — URINALYSIS, MICROSCOPIC (REFLEX)

## 2023-03-31 LAB — RAPID URINE DRUG SCREEN, HOSP PERFORMED
Amphetamines: NOT DETECTED
Barbiturates: NOT DETECTED
Benzodiazepines: NOT DETECTED
Cocaine: POSITIVE — AB
Opiates: NOT DETECTED
Tetrahydrocannabinol: NOT DETECTED

## 2023-03-31 LAB — HIV ANTIBODY (ROUTINE TESTING W REFLEX): HIV Screen 4th Generation wRfx: NONREACTIVE

## 2023-03-31 LAB — URINALYSIS, ROUTINE W REFLEX MICROSCOPIC
Bilirubin Urine: NEGATIVE
Glucose, UA: NEGATIVE mg/dL
Hgb urine dipstick: NEGATIVE
Ketones, ur: NEGATIVE mg/dL
Leukocytes,Ua: NEGATIVE
Nitrite: NEGATIVE
Protein, ur: 30 mg/dL — AB
Specific Gravity, Urine: >1.03 — ABNORMAL HIGH (ref 1.005–1.030)
pH: 6 (ref 5.0–8.0)

## 2023-03-31 LAB — PROTEIN / CREATININE RATIO, URINE
Creatinine, Urine: 181 mg/dL
Protein Creatinine Ratio: 0.49 mg/mg{creat} — ABNORMAL HIGH (ref 0.00–0.15)
Total Protein, Urine: 88 mg/dL

## 2023-03-31 LAB — POCT FERN TEST
POCT Fern Test: NEGATIVE
POCT Fern Test: POSITIVE

## 2023-03-31 LAB — HEMOGLOBIN A1C
Hgb A1c MFr Bld: 4.9 % (ref 4.8–5.6)
Mean Plasma Glucose: 93.93 mg/dL

## 2023-03-31 SURGERY — Surgical Case
Anesthesia: Spinal

## 2023-03-31 MED ORDER — METOCLOPRAMIDE HCL 5 MG/ML IJ SOLN
INTRAMUSCULAR | Status: DC | PRN
  Administered 2023-03-31: 10 mg via INTRAVENOUS

## 2023-03-31 MED ORDER — HYDRALAZINE HCL 20 MG/ML IJ SOLN: 10.0000 mg | INTRAMUSCULAR | Status: DC | PRN

## 2023-03-31 MED ORDER — SCOPOLAMINE 1 MG/3DAYS TD PT72
MEDICATED_PATCH | TRANSDERMAL | Status: DC | PRN
  Administered 2023-03-31: 1 via TRANSDERMAL

## 2023-03-31 MED ORDER — HYDRALAZINE HCL 20 MG/ML IJ SOLN
5.0000 mg | Freq: Once | INTRAMUSCULAR | Status: AC
  Administered 2023-03-31: 5 mg via INTRAVENOUS
  Filled 2023-03-31: qty 1

## 2023-03-31 MED ORDER — FENTANYL CITRATE (PF) 100 MCG/2ML IJ SOLN
INTRAMUSCULAR | Status: DC | PRN
  Administered 2023-03-31: 15 ug via INTRATHECAL

## 2023-03-31 MED ORDER — MORPHINE SULFATE (PF) 0.5 MG/ML IJ SOLN
INTRAMUSCULAR | Status: DC | PRN
  Administered 2023-03-31: 150 ug via INTRATHECAL

## 2023-03-31 MED ORDER — METHYLERGONOVINE MALEATE 0.2 MG/ML IJ SOLN
INTRAMUSCULAR | Status: DC | PRN
  Administered 2023-03-31: .2 mg via INTRAMUSCULAR

## 2023-03-31 MED ORDER — EPHEDRINE SULFATE (PRESSORS) 50 MG/ML IJ SOLN
INTRAMUSCULAR | Status: DC | PRN
  Administered 2023-03-31: 5 mg via INTRAVENOUS

## 2023-03-31 MED ORDER — SODIUM CHLORIDE 0.9 % IV SOLN
500.0000 mg | INTRAVENOUS | Status: DC
  Filled 2023-03-31: qty 5

## 2023-03-31 MED ORDER — BUPIVACAINE IN DEXTROSE 0.75-8.25 % IT SOLN
INTRATHECAL | Status: DC | PRN
  Administered 2023-03-31: 1.6 mL via INTRATHECAL

## 2023-03-31 MED ORDER — KETOROLAC TROMETHAMINE 30 MG/ML IJ SOLN
INTRAMUSCULAR | Status: DC | PRN
  Administered 2023-03-31: 30 mg via INTRAVENOUS

## 2023-03-31 MED ORDER — ONDANSETRON HCL 4 MG/2ML IJ SOLN
INTRAMUSCULAR | Status: DC | PRN
  Administered 2023-03-31: 4 mg via INTRAVENOUS

## 2023-03-31 MED ORDER — SODIUM CHLORIDE 0.9 % IV SOLN
INTRAVENOUS | Status: DC | PRN
  Administered 2023-03-31: 500 mg via INTRAVENOUS

## 2023-03-31 MED ORDER — MORPHINE SULFATE (PF) 0.5 MG/ML IJ SOLN
INTRAMUSCULAR | Status: AC
  Filled 2023-03-31: qty 10

## 2023-03-31 MED ORDER — LACTATED RINGERS IV SOLN: INTRAVENOUS | Status: DC

## 2023-03-31 MED ORDER — CEFAZOLIN SODIUM-DEXTROSE 2-4 GM/100ML-% IV SOLN: 2.0000 g | INTRAVENOUS | Status: DC

## 2023-03-31 MED ORDER — SODIUM CHLORIDE 0.9 % IV SOLN: INTRAVENOUS | Status: DC | PRN

## 2023-03-31 MED ORDER — LABETALOL HCL 5 MG/ML IV SOLN: 20.0000 mg | INTRAVENOUS | Status: DC | PRN

## 2023-03-31 MED ORDER — OXYTOCIN-SODIUM CHLORIDE 30-0.9 UT/500ML-% IV SOLN
INTRAVENOUS | Status: AC
  Filled 2023-03-31: qty 500

## 2023-03-31 MED ORDER — CEFAZOLIN SODIUM-DEXTROSE 2-4 GM/100ML-% IV SOLN
2.0000 g | INTRAVENOUS | Status: DC
  Administered 2023-03-31: 2 g via INTRAVENOUS
  Filled 2023-03-31: qty 100

## 2023-03-31 MED ORDER — DEXAMETHASONE SODIUM PHOSPHATE 10 MG/ML IJ SOLN
INTRAMUSCULAR | Status: AC
Start: 2023-03-31 — End: ?
  Filled 2023-03-31: qty 1

## 2023-03-31 MED ORDER — OXYTOCIN-SODIUM CHLORIDE 30-0.9 UT/500ML-% IV SOLN
INTRAVENOUS | Status: DC | PRN
  Administered 2023-03-31: 30 [IU] via INTRAVENOUS

## 2023-03-31 MED ORDER — PHENYLEPHRINE HCL-NACL 20-0.9 MG/250ML-% IV SOLN
INTRAVENOUS | Status: DC | PRN
  Administered 2023-03-31: 60 ug/min via INTRAVENOUS

## 2023-03-31 MED ORDER — METOCLOPRAMIDE HCL 5 MG/ML IJ SOLN
INTRAMUSCULAR | Status: AC
  Filled 2023-03-31: qty 2

## 2023-03-31 MED ORDER — TRANEXAMIC ACID-NACL 1000-0.7 MG/100ML-% IV SOLN
1000.0000 mg | Freq: Once | INTRAVENOUS | Status: AC
  Administered 2023-03-31: 1000 mg via INTRAVENOUS
  Filled 2023-03-31: qty 100

## 2023-03-31 MED ORDER — LABETALOL HCL 5 MG/ML IV SOLN: 40.0000 mg | INTRAVENOUS | Status: DC | PRN

## 2023-03-31 MED ORDER — ONDANSETRON HCL 4 MG/2ML IJ SOLN
INTRAMUSCULAR | Status: AC
  Filled 2023-03-31: qty 2

## 2023-03-31 MED ORDER — TRANEXAMIC ACID-NACL 1000-0.7 MG/100ML-% IV SOLN
INTRAVENOUS | Status: AC
  Filled 2023-03-31: qty 100

## 2023-03-31 MED ORDER — DEXAMETHASONE SODIUM PHOSPHATE 10 MG/ML IJ SOLN
INTRAMUSCULAR | Status: DC | PRN
  Administered 2023-03-31: 10 mg via INTRAVENOUS

## 2023-03-31 MED ORDER — AZITHROMYCIN 1 G PO PACK
1.0000 g | PACK | Freq: Once | ORAL | Status: DC
  Filled 2023-03-31: qty 1

## 2023-03-31 MED ORDER — PHENYLEPHRINE HCL (PRESSORS) 10 MG/ML IV SOLN
INTRAVENOUS | Status: DC | PRN
  Administered 2023-03-31 (×2): 80 ug via INTRAVENOUS

## 2023-03-31 MED ORDER — FENTANYL CITRATE (PF) 100 MCG/2ML IJ SOLN
INTRAMUSCULAR | Status: AC
  Filled 2023-03-31: qty 2

## 2023-03-31 MED ORDER — ACETAMINOPHEN 10 MG/ML IV SOLN
INTRAVENOUS | Status: DC | PRN
  Administered 2023-03-31: 1000 mg via INTRAVENOUS

## 2023-03-31 MED ORDER — LABETALOL HCL 5 MG/ML IV SOLN: 80.0000 mg | INTRAVENOUS | Status: DC | PRN

## 2023-03-31 MED ORDER — SOD CITRATE-CITRIC ACID 500-334 MG/5ML PO SOLN
30.0000 mL | Freq: Once | ORAL | Status: AC
  Administered 2023-03-31: 30 mL via ORAL
  Filled 2023-03-31: qty 30

## 2023-03-31 MED ORDER — POVIDONE-IODINE 10 % EX SWAB
2.0000 | Freq: Once | CUTANEOUS | Status: AC
  Administered 2023-03-31: 2 via TOPICAL

## 2023-03-31 SURGICAL SUPPLY — 34 items
BENZOIN TINCTURE PRP APPL 2/3 (GAUZE/BANDAGES/DRESSINGS) IMPLANT
CHLORAPREP W/TINT 26 (MISCELLANEOUS) ×2 IMPLANT
CLAMP UMBILICAL CORD (MISCELLANEOUS) ×1 IMPLANT
CLOTH BEACON ORANGE TIMEOUT ST (SAFETY) ×1 IMPLANT
DERMABOND ADVANCED .7 DNX12 (GAUZE/BANDAGES/DRESSINGS) ×2 IMPLANT
DRSG OPSITE POSTOP 4X10 (GAUZE/BANDAGES/DRESSINGS) ×1 IMPLANT
ELECT REM PT RETURN 9FT ADLT (ELECTROSURGICAL) ×1
ELECTRODE REM PT RTRN 9FT ADLT (ELECTROSURGICAL) ×1 IMPLANT
EXTRACTOR VACUUM BELL STYLE (SUCTIONS) IMPLANT
GLOVE BIOGEL PI IND STRL 7.0 (GLOVE) ×1 IMPLANT
GLOVE BIOGEL PI IND STRL 8 (GLOVE) ×1 IMPLANT
GLOVE ECLIPSE 8.0 STRL XLNG CF (GLOVE) ×1 IMPLANT
GOWN STRL REUS W/TWL LRG LVL3 (GOWN DISPOSABLE) ×2 IMPLANT
KIT ABG SYR 3ML LUER SLIP (SYRINGE) ×1 IMPLANT
MAT PREVALON FULL STRYKER (MISCELLANEOUS) IMPLANT
NDL HYPO 18GX1.5 BLUNT FILL (NEEDLE) ×1 IMPLANT
NDL HYPO 25X5/8 SAFETYGLIDE (NEEDLE) ×1 IMPLANT
NEEDLE HYPO 18GX1.5 BLUNT FILL (NEEDLE) ×1 IMPLANT
NEEDLE HYPO 22GX1.5 SAFETY (NEEDLE) ×1 IMPLANT
NEEDLE HYPO 25X5/8 SAFETYGLIDE (NEEDLE) ×1 IMPLANT
NS IRRIG 1000ML POUR BTL (IV SOLUTION) ×1 IMPLANT
PACK C SECTION WH (CUSTOM PROCEDURE TRAY) ×1 IMPLANT
PAD OB MATERNITY 4.3X12.25 (PERSONAL CARE ITEMS) ×1 IMPLANT
RTRCTR C-SECT PINK 25CM LRG (MISCELLANEOUS) IMPLANT
STRIP CLOSURE SKIN 1/2X4 (GAUZE/BANDAGES/DRESSINGS) IMPLANT
SUT CHROMIC 0 CT 1 (SUTURE) ×1 IMPLANT
SUT MNCRL 0 VIOLET CTX 36 (SUTURE) ×2 IMPLANT
SUT PLAIN 2 0 XLH (SUTURE) IMPLANT
SUT PLAIN ABS 2-0 CT1 27XMFL (SUTURE) IMPLANT
SUT VIC AB 0 CTX36XBRD ANBCTRL (SUTURE) ×1 IMPLANT
SUT VIC AB 4-0 KS 27 (SUTURE) IMPLANT
TOWEL OR 17X24 6PK STRL BLUE (TOWEL DISPOSABLE) ×1 IMPLANT
TRAY FOLEY W/BAG SLVR 14FR LF (SET/KITS/TRAYS/PACK) IMPLANT
WATER STERILE IRR 1000ML POUR (IV SOLUTION) ×1 IMPLANT

## 2023-03-31 NOTE — MAU Provider Note (Signed)
Chief Complaint  Patient presents with   Contractions   Rupture of Membranes    Event Date/Time   First Provider Initiated Contact with Patient 03/31/23 1456      S: Merisa Dhanraj  is a 32 y.o. y.o. year old G20P2234 female at [redacted]w[redacted]d weeks gestation dated by 8-week ultrasound who presents to MAU with report of leaking fluid since 10 AM and incidental finding of elevated blood pressures.  Reports 1 elevated blood pressure this pregnancy but has not received prenatal care so it has not been monitored.. Current blood pressure medication: None.   Associated symptoms: Negative for headache, vision changes, epigastric pain Contractions: Denies Vaginal bleeding: Denies Fetal movement: Normal  Essentially no prenatal care.  Had nurse intake in June 2024 where routine first trimester labs were drawn.  Did not keep appointments for prenatal care.  Had first trimester ultrasound but has not had anatomy ultrasound.  UDS positive for: Cocaine and THC 02/04/2023.  Did not follow-up for prenatal visit that was arranged.  28-week labs collected in MAU today.  O: Patient Vitals for the past 24 hrs:  BP Temp Temp src Pulse Resp SpO2 Height Weight  03/31/23 2011 (!) 157/99 -- -- 71 -- -- -- --  03/31/23 1916 (!) 149/90 -- -- 73 -- -- -- --  03/31/23 1901 (!) 141/96 -- -- 50 -- -- -- --  03/31/23 1842 -- -- -- -- -- 100 % -- --  03/31/23 1831 (!) 148/97 -- -- 72 -- -- -- --  03/31/23 1816 (!) 148/106 -- -- 91 -- -- -- --  03/31/23 1800 (!) 151/96 -- -- 85 -- 100 % -- --  03/31/23 1738 (!) 153/103 -- -- 76 -- -- -- --  03/31/23 1531 (!) 142/95 -- -- 89 -- -- -- --  03/31/23 1516 (!) 151/101 -- -- 87 -- -- -- --  03/31/23 1445 (!) 141/95 -- -- 87 -- 100 % -- --  03/31/23 1440 -- -- -- -- -- 100 % -- --  03/31/23 1435 -- -- -- -- -- 100 % -- --  03/31/23 1431 (!) 145/96 -- -- 91 -- -- -- --  03/31/23 1430 -- -- -- -- -- 100 % -- --  03/31/23 1427 (!) 153/97 98.1 F (36.7 C) Oral 96 17 100 % 5\' 3"  (1.6 m)  76.7 kg   General: NAD Heart: Regular rate Lungs: Normal rate and effort Abd: Soft, NT, Gravid, S=D Extremities: 3+ pedal edema Neuro: 2+ deep tendon reflexes, No clonus Pelvic: NEFG, no bleeding.  Grossly leaking moderate amount of clear fluid.   Cervical exam deferred.  EFM: 135, Moderate variability, 15 x 15 accelerations, no decelerations Toco: None  Results for orders placed or performed during the hospital encounter of 03/31/23 (from the past 24 hour(s))  Fern Test     Status: Normal   Collection Time: 03/31/23  2:38 PM  Result Value Ref Range   POCT Fern Test Negative = intact amniotic membranes   Urinalysis, Routine w reflex microscopic -Urine, Clean Catch     Status: Abnormal   Collection Time: 03/31/23  2:48 PM  Result Value Ref Range   Color, Urine YELLOW YELLOW   APPearance CLEAR CLEAR   Specific Gravity, Urine >1.030 (H) 1.005 - 1.030   pH 6.0 5.0 - 8.0   Glucose, UA NEGATIVE NEGATIVE mg/dL   Hgb urine dipstick NEGATIVE NEGATIVE   Bilirubin Urine NEGATIVE NEGATIVE   Ketones, ur NEGATIVE NEGATIVE mg/dL   Protein, ur 30 (  A) NEGATIVE mg/dL   Nitrite NEGATIVE NEGATIVE   Leukocytes,Ua NEGATIVE NEGATIVE  Protein / creatinine ratio, urine     Status: Abnormal   Collection Time: 03/31/23  2:48 PM  Result Value Ref Range   Creatinine, Urine 181 mg/dL   Total Protein, Urine 88 mg/dL   Protein Creatinine Ratio 0.49 (H) 0.00 - 0.15 mg/mg[Cre]  Rapid urine drug screen (hospital performed)     Status: Abnormal   Collection Time: 03/31/23  2:48 PM  Result Value Ref Range   Opiates NONE DETECTED NONE DETECTED   Cocaine POSITIVE (A) NONE DETECTED   Benzodiazepines NONE DETECTED NONE DETECTED   Amphetamines NONE DETECTED NONE DETECTED   Tetrahydrocannabinol NONE DETECTED NONE DETECTED   Barbiturates NONE DETECTED NONE DETECTED  Urinalysis, Microscopic (reflex)     Status: Abnormal   Collection Time: 03/31/23  2:48 PM  Result Value Ref Range   RBC / HPF 0-5 0 - 5 RBC/hpf    WBC, UA 0-5 0 - 5 WBC/hpf   Bacteria, UA RARE (A) NONE SEEN   Squamous Epithelial / HPF 0-5 0 - 5 /HPF   Mucus PRESENT    Urine-Other MICROSCOPIC EXAM PERFORMED ON UNCONCENTRATED URINE   Comprehensive metabolic panel     Status: Abnormal   Collection Time: 03/31/23  3:11 PM  Result Value Ref Range   Sodium 137 135 - 145 mmol/L   Potassium 3.7 3.5 - 5.1 mmol/L   Chloride 108 98 - 111 mmol/L   CO2 20 (L) 22 - 32 mmol/L   Glucose, Bld 80 70 - 99 mg/dL   BUN 7 6 - 20 mg/dL   Creatinine, Ser 2.84 0.44 - 1.00 mg/dL   Calcium 8.7 (L) 8.9 - 10.3 mg/dL   Total Protein 5.8 (L) 6.5 - 8.1 g/dL   Albumin 2.6 (L) 3.5 - 5.0 g/dL   AST 24 15 - 41 U/L   ALT 11 0 - 44 U/L   Alkaline Phosphatase 337 (H) 38 - 126 U/L   Total Bilirubin 0.5 <1.2 mg/dL   GFR, Estimated >13 >24 mL/min   Anion gap 9 5 - 15  CBC     Status: Abnormal   Collection Time: 03/31/23  3:11 PM  Result Value Ref Range   WBC 6.2 4.0 - 10.5 K/uL   RBC 4.09 3.87 - 5.11 MIL/uL   Hemoglobin 9.7 (L) 12.0 - 15.0 g/dL   HCT 40.1 (L) 02.7 - 25.3 %   MCV 77.3 (L) 80.0 - 100.0 fL   MCH 23.7 (L) 26.0 - 34.0 pg   MCHC 30.7 30.0 - 36.0 g/dL   RDW 66.4 (H) 40.3 - 47.4 %   Platelets 335 150 - 400 K/uL   nRBC 0.0 0.0 - 0.2 %  Hemoglobin A1c     Status: None   Collection Time: 03/31/23  3:11 PM  Result Value Ref Range   Hgb A1c MFr Bld 4.9 4.8 - 5.6 %   Mean Plasma Glucose 93.93 mg/dL  HIV Antibody (routine testing w rflx)     Status: None   Collection Time: 03/31/23  3:11 PM  Result Value Ref Range   HIV Screen 4th Generation wRfx Non Reactive Non Reactive  Wet prep, genital     Status: None   Collection Time: 03/31/23  3:11 PM   Specimen: Vaginal  Result Value Ref Range   Yeast Wet Prep HPF POC NONE SEEN NONE SEEN   Trich, Wet Prep NONE SEEN NONE SEEN   Clue Cells Wet  Prep HPF POC NONE SEEN NONE SEEN   WBC, Wet Prep HPF POC <10 <10   Sperm NONE SEEN   Fern Test     Status: Abnormal   Collection Time: 03/31/23  3:12 PM   Result Value Ref Range   POCT Fern Test Positive = ruptured amniotic membanes     MAU Course Orders Placed This Encounter  Procedures   Culture, beta strep (group b only)    Standing Status:   Standing    Number of Occurrences:   1   Wet prep, genital    Standing Status:   Standing    Number of Occurrences:   1   Comprehensive metabolic panel    Standing Status:   Standing    Number of Occurrences:   1   CBC    Standing Status:   Standing    Number of Occurrences:   1   Urinalysis, Routine w reflex microscopic -Urine, Clean Catch    Standing Status:   Standing    Number of Occurrences:   1    Order Specific Question:   Specimen Source    Answer:   Urine, Clean Catch [76]   Protein / creatinine ratio, urine    Standing Status:   Standing    Number of Occurrences:   1   Hemoglobin A1c    Standing Status:   Standing    Number of Occurrences:   1   HIV Antibody (routine testing w rflx)    Standing Status:   Standing    Number of Occurrences:   1   RPR    Standing Status:   Standing    Number of Occurrences:   1   Rapid urine drug screen (hospital performed)    Standing Status:   Standing    Number of Occurrences:   1   Urinalysis, Microscopic (reflex)    Standing Status:   Standing    Number of Occurrences:   1   Diet NPO time specified    Standing Status:   Standing    Number of Occurrences:   1   Notify physician (specify) Confirmatory reading of BP> 160/110 15 minutes later    Confirmatory reading of BP> 160/110 15 minutes later    Standing Status:   Standing    Number of Occurrences:   1    Order Specific Question:   Notify Physician    Answer:   Temp greater than or equal to 100.4    Order Specific Question:   Notify Physician    Answer:   RR greater than 24 or less than 10    Order Specific Question:   Notify Physician    Answer:   HR greater than 120 or less than 50    Order Specific Question:   Notify Physician    Answer:   SBP greater than 160 mmHG or  less than 80 mmHG    Order Specific Question:   Notify Physician    Answer:   DBP greater than 110 mmHG or less than 45 mmHG    Order Specific Question:   Notify Physician    Answer:   Urinary output is less than for any 4 hour period   Apply Hypertensive Disorders of Pregnancy Care Plan    Standing Status:   Standing    Number of Occurrences:   1   Vital signs    Standing Status:   Standing    Number of Occurrences:  1   Measure blood pressure    20 minutes after giving hydralazine 10 MG IV dose.  Call MD if SBP >/= 160 or DBP >/= 110.    Standing Status:   Standing    Number of Occurrences:   1   Consult to Transition of Care Team    36 weeks. Water broke. C/S planned 10:00 pm. Plans adoption.    Standing Status:   Standing    Number of Occurrences:   1    Order Specific Question:   Reason for Consult:    Answer:   Adoption   Fern Test    Standing Status:   Standing    Number of Occurrences:   1   Fern Test    Standing Status:   Standing    Number of Occurrences:   1   Meds ordered this encounter  Medications   AND Linked Order Group    labetalol (NORMODYNE) injection 20 mg    labetalol (NORMODYNE) injection 40 mg    labetalol (NORMODYNE) injection 80 mg    hydrALAZINE (APRESOLINE) injection 10 mg   Assessment: 1. PPROM, no labor 2. Fetal Wellbeing: Category 1 3. Pain Control: N/A 4. GBS: Pending 5.  36.1 week IUP 6.  Prior C-section x 4.  Plans repeat. 7.  No prenatal care. 8.  Plans to put baby up for adoption. 9.  Cocaine positive 10.  Gestational hypertension versus preeclampsia (elevated protein creatinine ratio possibly due to contamination with amniotic fluid)  Plan:  1.  Prep for OR per consult with MD 2.  N.p.o. 3.  Social work consult performed in MAU due to plans for putting baby up for adoption.  Patient does not want to hold baby or have baby room in after delivery.  Repeat social work consult postpartum. 4.  PCN for unknown GBS and  preterm. 5.  Has Depo for contraception 6.  Watch blood pressure.  Magnesium sulfate not currently indicated. 7.  Neonatologist informed of late preterm ROM and plan for delivery today and that patient was cocaine positive.  NICU is currently full.  Baby may need to be transferred to outside hospital if NICU admission indicated.    Katrinka Blazing, [place], [place] 03/31/2023 2:49 PM

## 2023-03-31 NOTE — Anesthesia Procedure Notes (Signed)
Spinal  Patient location during procedure: OR Start time: 03/31/2023 10:10 PM End time: 03/31/2023 10:14 PM Reason for block: surgical anesthesia Staffing Performed: anesthesiologist  Anesthesiologist: Linton Rump, MD Performed by: Linton Rump, MD Authorized by: Linton Rump, MD   Preanesthetic Checklist Completed: patient identified, IV checked, site marked, risks and benefits discussed, surgical consent, monitors and equipment checked, pre-op evaluation and timeout performed Spinal Block Patient position: sitting Prep: DuraPrep Patient monitoring: blood pressure and continuous pulse ox Approach: midline Location: L3-4 Injection technique: single-shot Needle Needle type: Pencan  Needle gauge: 24 G Needle length: 9 cm Assessment Sensory level: T4 Additional Notes Risks and benefits of neuraxial anesthesia including, but not limited to, infection, bleeding, local anesthetic toxicity, headache, hypotension, back pain, block failure, etc. were discussed with the patient. The patient expressed understanding and consented to the procedure. I confirmed that the patient has no bleeding disorders and is not taking blood thinners. I confirmed the patient's last platelet count with the nurse. Monitors were applied. A time-out was performed immediately prior to the procedure. Sterile technique was used throughout the whole procedure.   1 attempt(s)

## 2023-03-31 NOTE — MAU Note (Signed)
[name] Young is a 32 y.o. at [redacted]w[redacted]d here in MAU reporting: irregular contractions and possible LOF. A gush of clear around 1000 and has been continually leaking since then. Denies any VB. Report mucus discharge and +FM. Pt reports bilateral lower extremity swelling that started 1 month ago and worsened 1 week ago.   LMP: n/a Onset of complaint: 1000 Pain score: 0 Vitals:   03/31/23 1427  BP: (!) 153/97  Pulse: 96  Resp: 17  Temp: 98.1 F (36.7 C)  SpO2: 100%     FHT:135  Lab orders placed from triage:

## 2023-03-31 NOTE — Transfer of Care (Signed)
Immediate Anesthesia Transfer of Care Note  Patient: [name] Young  Procedure(s) Performed: CESAREAN SECTION  Patient Location: PACU  Anesthesia Type:Spinal  Level of Consciousness: awake, alert , and oriented  Airway & Oxygen Therapy: Patient Spontanous Breathing  Post-op Assessment: Report given to RN and Post -op Vital signs reviewed and stable  Post vital signs: Reviewed and stable  Last Vitals:  Vitals Value Taken Time  BP 104/62 03/31/23 2315  Temp    Pulse 72 03/31/23 2318  Resp 11 03/31/23 2318  SpO2 95 % 03/31/23 2318  Vitals shown include unfiled device data.  Last Pain:  Vitals:   03/31/23 1427  TempSrc: Oral  PainSc: 0-No pain         Complications: No notable events documented.

## 2023-03-31 NOTE — Op Note (Signed)
Preoperative diagnosis:  1.  Intrauterine pregnancy at [redacted]w[redacted]d  weeks gestation                                         2.  P/PROM                                         3.  Previous C section x 4                                         4.  No prenatal care                                         5.  Cocaine abuse, +UDS on admission                                         6.  BUFA                                         7.  Declines BTL                                         8. Pre eclampsia     Postoperative diagnosis:  Same as above   Procedure:  Repeat cesarean section  Surgeon:  Lazaro Arms MD  Assistant:    Anesthesia: Spinal  Findings:  .    Over a low transverse incision was delivered a viable female with Apgars of pending and  weighing pending lbs.  oz. Uterus, tubes and ovaries were all normal.  There were no other significant findings  Description of operation:  Patient was taken to the operating room and placed in the sitting position where she underwent a spinal anesthetic. She was then placed in the supine position with tilt to the left side. When adequate anesthetic level was obtained she was prepped and draped in usual sterile fashion and a Foley catheter was placed. A Pfannenstiel skin incision was made and carried down sharply to the rectus fascia which was scored in the midline extended laterally. The fascia was taken off the muscles both superiorly and without difficulty. The muscles were divided.  The peritoneal cavity was entered.  Bladder blade was placed, no bladder flap was created.  A low transverse hysterotomy incision was made and delivered a viable female  infant at 2238 with Apgars of pending and  weighingpending lbs  oz.   The uterus was exteriorized. It was closed in 1 layers, the first being a running interlocking layer. There was good resulting hemostasis. The uterus tubes and ovaries were all normal. Peritoneal cavity was irrigated vigorously. The muscles  and peritoneum were reapproximated loosely. The fascia was closed using 0 Vicryl in running fashion. Subcutaneous tissue was made hemostatic and irrigated. The  skin was closed using 4-0 Vicryl on a Keith needle in a subcuticular fashion.  Dermabond was placed for additional wound integrity and to serve as a barrier. Blood loss for the procedure was 240 cc. The patient received 2 gram of Ancef and 1 gram of azithromycin and 1 gram of TXA prophylactically. The patient was taken to the recovery room in good stable condition with all counts being correct x3.  EBL 240 cc  [name] Young 03/31/2023 11:00 PM

## 2023-03-31 NOTE — H&P (Addendum)
[name] Young is a 32 y.o. female [id number] Estimated Date of Delivery: 04/27/23 [redacted]w[redacted]d with no prenatal care presenting for P/PROM Previous C section x 4, declines BTL Denies recent use of cocaine but UDS +cocaine Exam confirms P/PROM no labor BP is elevated with ^P/Cr platelets, LFTs normal She ate at 1400, will proceed with repeat C section @2200 , discussed with Dr Freida Busman, anesthesia Dated by an 11 week sonogram at MAU visit . OB History     Gravida  8   Para  4   Term  2   Preterm  2   AB  3   Living  4      SAB  0   IAB  3   Ectopic  0   Multiple  0   Live Births  4          Past Medical History:  Diagnosis Date   Depression    Hx MRSA infection    Pregnancy induced hypertension    Past Surgical History:  Procedure Laterality Date   CESAREAN SECTION N/A 02/17/2014   Procedure: CESAREAN SECTION;  Surgeon: Tereso Newcomer, MD;  Location: [facility];  Service: Obstetrics;  Laterality: N/A;   CESAREAN SECTION N/A 01/08/2015   Procedure: CESAREAN SECTION;  Surgeon: Tereso Newcomer, MD;  Location: [facility];  Service: Obstetrics;  Laterality: N/A;   CESAREAN SECTION N/A 12/05/2016   Procedure: CESAREAN SECTION;  Surgeon: Levie Heritage, DO;  Location: [facility] BIRTHING SUITES;  Service: Obstetrics;  Laterality: N/A;   CESAREAN SECTION N/A 11/27/2017   Procedure: CESAREAN SECTION;  Surgeon: Levie Heritage, DO;  Location: [facility] BIRTHING SUITES;  Service: Obstetrics;  Laterality: N/A;   Family History: family history includes Hypertension in her father and mother. Social History:  reports that she has been smoking cigarettes. She has never used smokeless tobacco. She reports that she does not currently use alcohol. She reports that she does not currently use drugs after having used the following drugs: Cocaine.   No prenatal care    Maternal Diabetes: na Genetic Screening: na Maternal Ultrasounds/Referrals: na Fetal Ultrasounds or other Referrals:  na Maternal Substance Abuse:   +cocaine Significant Maternal Medications:  NA Significant Maternal Lab Results:  NA Number of Prenatal Visits: none Maternal Vaccinations:none Other Comments:    History Past Medical History:  Diagnosis Date   Depression    Hx MRSA infection    Pregnancy induced hypertension     Past Surgical History:  Procedure Laterality Date   CESAREAN SECTION N/A 02/17/2014   Procedure: CESAREAN SECTION;  Surgeon: Tereso Newcomer, MD;  Location: [facility];  Service: Obstetrics;  Laterality: N/A;   CESAREAN SECTION N/A 01/08/2015   Procedure: CESAREAN SECTION;  Surgeon: Tereso Newcomer, MD;  Location: [facility];  Service: Obstetrics;  Laterality: N/A;   CESAREAN SECTION N/A 12/05/2016   Procedure: CESAREAN SECTION;  Surgeon: Levie Heritage, DO;  Location: [facility] BIRTHING SUITES;  Service: Obstetrics;  Laterality: N/A;   CESAREAN SECTION N/A 11/27/2017   Procedure: CESAREAN SECTION;  Surgeon: Levie Heritage, DO;  Location: [facility] BIRTHING SUITES;  Service: Obstetrics;  Laterality: N/A;    OB History     Gravida  8   Para  4   Term  2   Preterm  2   AB  3   Living  4      SAB  0   IAB  3   Ectopic  0   Multiple  0  Live Births  4           Allergies  Allergen Reactions   Ace Inhibitors Swelling    Social History   Socioeconomic History   Marital status: Married    Spouse name: Not on file   Number of children: Not on file   Years of education: Not on file   Highest education level: Not on file  Occupational History   Not on file  Tobacco Use   Smoking status: Every Day    Types: Cigarettes   Smokeless tobacco: Never  Vaping Use   Vaping status: Never Used  Substance and Sexual Activity   Alcohol use: Not Currently   Drug use: Not Currently    Types: Cocaine   Sexual activity: Yes    Birth control/protection: None  Other Topics Concern   Not on file  Social History Narrative   Not on file   Social Determinants of Health   Financial Resource Strain: Not  on file  Food Insecurity: Not on file  Transportation Needs: Not on file  Physical Activity: Not on file  Stress: Not on file  Social Connections: Unknown (09/13/2021)   [facility]   Social Network    Social Network: Not on file    Family History  Problem Relation Age of Onset   Hypertension Mother    Hypertension Father       Blood pressure (!) 156/95, pulse 74, temperature 98.1 F (36.7 C), temperature source Oral, resp. rate 17, height 5\' 3"  (1.6 m), weight 76.7 kg, last menstrual period 07/21/2022, SpO2 100%, unknown if currently breastfeeding. Exam Physical Exam  Physical Exam  Vitals reviewed. Constitutional: She is oriented to person, place, and time. She appears well-developed and well-nourished.  HENT:  Head: Normocephalic and atraumatic.  Right Ear: External ear normal.  Left Ear: External ear normal.  Nose: Nose normal.  Mouth/Throat: Oropharynx is clear and moist.  Eyes: Conjunctivae and EOM are normal. Pupils are equal, round, and reactive to light. Right eye exhibits no discharge. Left eye exhibits no discharge. No scleral icterus.  Neck: Normal range of motion. Neck supple. No tracheal deviation present. No thyromegaly present.  Cardiovascular: Normal rate, regular rhythm, normal heart sounds and intact distal pulses.  Exam reveals no gallop and no friction rub.   No murmur heard. Respiratory: Effort normal and breath sounds normal. No respiratory distress. She has no wheezes. She has no rales. She exhibits no tenderness.  GI: Soft. Bowel sounds are normal. She exhibits no distension and no mass. There is tenderness. There is no rebound and no guarding.  Genitourinary:       Vulva is normal without lesions Vagina is pink moist without discharge Cervix normal in appearance and pap is normal Uterus is term size Adnexa is negative with normal sized ovaries by sonogram  Musculoskeletal: Normal range of motion. She exhibits no edema  and no tenderness.  Neurological: She is alert and oriented to person, place, and time. She has normal reflexes. She displays normal reflexes. No cranial nerve deficit. She exhibits normal muscle tone. Coordination normal.  Skin: Skin is warm and dry. No rash noted. No erythema. No pallor.  Psychiatric: She has a normal mood and affect. Her behavior is normal. Judgment and thought content normal.   Prenatal labs: ABO, Rh: B/Negative/-- (06/10 1017) Antibody: Negative (06/10 1017) Rubella: <0.90 (06/10 1017) RPR: Non Reactive (06/10 1017)  HBsAg: Negative (06/10 1017)  HIV: Non Reactive (11/30 1511)  GBS:     Assessment/Plan: [id number] [redacted]w[redacted]d Estimated Date of Delivery: 04/27/23  Previous C section x 4, declines BTL +cocaine on UDS BUFA  Pre eclmpsia  Repeat C section @2200   Lazaro Arms 03/31/2023, 8:37 PM

## 2023-03-31 NOTE — MAU Note (Signed)
Social worker at bedside.

## 2023-03-31 NOTE — Anesthesia Preprocedure Evaluation (Signed)
Anesthesia Evaluation  Patient identified by MRN, date of birth, ID band Patient awake    Reviewed: Allergy & Precautions, NPO status , Patient's Chart, lab work & pertinent test results  History of Anesthesia Complications Negative for: history of anesthetic complications  Airway Mallampati: III  TM Distance: >3 FB Neck ROM: Full    Dental   Pulmonary neg shortness of breath, neg recent URI, Current Smoker   Pulmonary exam normal breath sounds clear to auscultation       Cardiovascular hypertension (mild pre-eclampsia),  Rhythm:Regular Rate:Normal     Neuro/Psych  PSYCHIATRIC DISORDERS  Depression    negative neurological ROS     GI/Hepatic ,GERD  ,,(+)     substance abuse (Tox screen positive for cocaine today. Patient reports last use >48 hours ago.)  cocaine use  Endo/Other  negative endocrine ROS    Renal/GU negative Renal ROS     Musculoskeletal   Abdominal   Peds  Hematology  (+) Blood dyscrasia, anemia Lab Results      Component                Value               Date                      WBC                      6.2                 03/31/2023                HGB                      9.7 (L)             03/31/2023                HCT                      31.6 (L)            03/31/2023                MCV                      77.3 (L)            03/31/2023                PLT                      335                 03/31/2023              Anesthesia Other Findings H/o c-section x4  Patient is giving baby up for adoption and does not want to hold baby.  Reproductive/Obstetrics (+) Pregnancy                              Anesthesia Physical Anesthesia Plan  ASA: 3  Anesthesia Plan: Spinal   Post-op Pain Management:    Induction:   PONV Risk Score and Plan: 1 and Ondansetron, Dexamethasone and Treatment may vary due to age or medical condition  Airway Management Planned:  Natural Airway  Additional Equipment:  Intra-op Plan:   Post-operative Plan:   Informed Consent: I have reviewed the patients History and Physical, chart, labs and discussed the procedure including the risks, benefits and alternatives for the proposed anesthesia with the patient or authorized representative who has indicated his/her understanding and acceptance.       Plan Discussed with: Anesthesiologist and CRNA  Anesthesia Plan Comments: (I have discussed risks of neuraxial anesthesia including but not limited to infection, bleeding, nerve injury, back pain, headache, seizures, and failure of block. Patient denies bleeding disorders and is not currently anticoagulated. Labs have been reviewed. Risks and benefits discussed. All patient's questions answered.  )         Anesthesia Quick Evaluation

## 2023-03-31 NOTE — Social Work (Signed)
CSW met with patient at bedside. Patient is choosing adoption for her baby. Patient was very tearful and repeated "I can't do this, I can't keep her". Patient requests not to hold the baby and wants baby to go to the nursery after delivery. Patient does not have an adoption agency picked at this time. CSW gave patient a list of agencies to review. CSW will complete assessment after delivery.   Jimmy Picket, LCSW Clinical Social Worker

## 2023-04-01 ENCOUNTER — Other Ambulatory Visit: Payer: Self-pay

## 2023-04-01 DIAGNOSIS — Z98891 History of uterine scar from previous surgery: Secondary | ICD-10-CM

## 2023-04-01 LAB — COMPREHENSIVE METABOLIC PANEL
ALT: 11 U/L (ref 0–44)
AST: 23 U/L (ref 15–41)
Albumin: 2.3 g/dL — ABNORMAL LOW (ref 3.5–5.0)
Alkaline Phosphatase: 272 U/L — ABNORMAL HIGH (ref 38–126)
Anion gap: 6 (ref 5–15)
BUN: 6 mg/dL (ref 6–20)
CO2: 21 mmol/L — ABNORMAL LOW (ref 22–32)
Calcium: 8.3 mg/dL — ABNORMAL LOW (ref 8.9–10.3)
Chloride: 108 mmol/L (ref 98–111)
Creatinine, Ser: 0.92 mg/dL (ref 0.44–1.00)
GFR, Estimated: >60 mL/min (ref >60)
Glucose, Bld: 102 mg/dL — ABNORMAL HIGH (ref 70–99)
Potassium: 4.4 mmol/L (ref 3.5–5.1)
Sodium: 135 mmol/L (ref 135–145)
Total Bilirubin: 0.6 mg/dL (ref <1.2)
Total Protein: 5.2 g/dL — ABNORMAL LOW (ref 6.5–8.1)

## 2023-04-01 LAB — RPR: RPR Ser Ql: NONREACTIVE

## 2023-04-01 LAB — CBC
HCT: 25.9 % — ABNORMAL LOW (ref 36.0–46.0)
Hemoglobin: 7.6 g/dL — ABNORMAL LOW (ref 12.0–15.0)
MCH: 22.7 pg — ABNORMAL LOW (ref 26.0–34.0)
MCHC: 29.3 g/dL — ABNORMAL LOW (ref 30.0–36.0)
MCV: 77.3 fL — ABNORMAL LOW (ref 80.0–100.0)
Platelets: 340 10*3/uL (ref 150–400)
RBC: 3.35 MIL/uL — ABNORMAL LOW (ref 3.87–5.11)
RDW: 16.2 % — ABNORMAL HIGH (ref 11.5–15.5)
WBC: 8.7 10*3/uL (ref 4.0–10.5)
nRBC: 0 % (ref 0.0–0.2)

## 2023-04-01 MED ORDER — ENOXAPARIN SODIUM 40 MG/0.4ML IJ SOSY
40.0000 mg | PREFILLED_SYRINGE | INTRAMUSCULAR | Status: DC
  Administered 2023-04-01 – 2023-04-02 (×2): 40 mg via SUBCUTANEOUS
  Filled 2023-04-01 (×2): qty 0.4

## 2023-04-01 MED ORDER — SIMETHICONE 80 MG PO CHEW
80.0000 mg | CHEWABLE_TABLET | Freq: Three times a day (TID) | ORAL | Status: DC
  Administered 2023-04-01 – 2023-04-02 (×6): 80 mg via ORAL
  Filled 2023-04-01 (×6): qty 1

## 2023-04-01 MED ORDER — KETOROLAC TROMETHAMINE 30 MG/ML IJ SOLN
30.0000 mg | Freq: Four times a day (QID) | INTRAMUSCULAR | Status: DC
  Administered 2023-04-01: 30 mg via INTRAVENOUS
  Filled 2023-04-01: qty 1

## 2023-04-01 MED ORDER — MISOPROSTOL 200 MCG PO TABS
ORAL_TABLET | ORAL | Status: AC
  Filled 2023-04-01: qty 2

## 2023-04-01 MED ORDER — WITCH HAZEL-GLYCERIN EX PADS: 1.0000 | MEDICATED_PAD | CUTANEOUS | Status: DC | PRN

## 2023-04-01 MED ORDER — SENNOSIDES-DOCUSATE SODIUM 8.6-50 MG PO TABS
2.0000 | ORAL_TABLET | Freq: Every day | ORAL | Status: DC
  Administered 2023-04-01 – 2023-04-02 (×2): 2 via ORAL
  Filled 2023-04-01 (×2): qty 2

## 2023-04-01 MED ORDER — MISOPROSTOL 200 MCG PO TABS
400.0000 ug | ORAL_TABLET | Freq: Once | ORAL | Status: AC
  Administered 2023-04-01: 400 ug via BUCCAL

## 2023-04-01 MED ORDER — IBUPROFEN 600 MG PO TABS: 600.0000 mg | ORAL_TABLET | Freq: Four times a day (QID) | ORAL | Status: DC

## 2023-04-01 MED ORDER — NIFEDIPINE ER OSMOTIC RELEASE 30 MG PO TB24
30.0000 mg | ORAL_TABLET | Freq: Every day | ORAL | Status: DC
  Administered 2023-04-01 – 2023-04-02 (×2): 30 mg via ORAL
  Filled 2023-04-01 (×2): qty 1

## 2023-04-01 MED ORDER — COCONUT OIL OIL: 1.0000 | TOPICAL_OIL | Status: DC | PRN

## 2023-04-01 MED ORDER — DIBUCAINE (PERIANAL) 1 % EX OINT: 1.0000 | TOPICAL_OINTMENT | CUTANEOUS | Status: DC | PRN

## 2023-04-01 MED ORDER — DIPHENHYDRAMINE HCL 25 MG PO CAPS
25.0000 mg | ORAL_CAPSULE | Freq: Four times a day (QID) | ORAL | Status: DC | PRN
  Administered 2023-04-01 (×2): 25 mg via ORAL
  Filled 2023-04-01 (×2): qty 1

## 2023-04-01 MED ORDER — RHO D IMMUNE GLOBULIN 1500 UNIT/2ML IJ SOSY
300.0000 ug | PREFILLED_SYRINGE | Freq: Once | INTRAMUSCULAR | Status: AC
Start: 2023-04-01 — End: 2023-04-01
  Administered 2023-04-01: 300 ug via INTRAVENOUS
  Filled 2023-04-01: qty 2

## 2023-04-01 MED ORDER — FUROSEMIDE 10 MG/ML IJ SOLN
20.0000 mg | Freq: Once | INTRAMUSCULAR | Status: AC
  Administered 2023-04-01: 20 mg via INTRAVENOUS
  Filled 2023-04-01: qty 2

## 2023-04-01 MED ORDER — FUROSEMIDE 20 MG PO TABS
20.0000 mg | ORAL_TABLET | Freq: Two times a day (BID) | ORAL | Status: DC
  Administered 2023-04-01 – 2023-04-02 (×4): 20 mg via ORAL
  Filled 2023-04-01 (×4): qty 1

## 2023-04-01 MED ORDER — METHYLERGONOVINE MALEATE 0.2 MG PO TABS
0.2000 mg | ORAL_TABLET | ORAL | Status: DC | PRN
Start: 2023-04-01 — End: 2023-04-02
  Administered 2023-04-01 (×2): 0.2 mg via ORAL
  Filled 2023-04-01 (×2): qty 1

## 2023-04-01 MED ORDER — PRENATAL MULTIVITAMIN CH
1.0000 | ORAL_TABLET | Freq: Every day | ORAL | Status: DC
  Administered 2023-04-01 – 2023-04-02 (×2): 1 via ORAL
  Filled 2023-04-01 (×2): qty 1

## 2023-04-01 MED ORDER — MEDROXYPROGESTERONE ACETATE 150 MG/ML IM SUSP
150.0000 mg | INTRAMUSCULAR | Status: DC | PRN
  Filled 2023-04-01: qty 1

## 2023-04-01 MED ORDER — GABAPENTIN 300 MG PO CAPS
300.0000 mg | ORAL_CAPSULE | Freq: Three times a day (TID) | ORAL | Status: DC
  Administered 2023-04-01 – 2023-04-02 (×5): 300 mg via ORAL
  Filled 2023-04-01 (×5): qty 1

## 2023-04-01 MED ORDER — METHYLERGONOVINE MALEATE 0.2 MG/ML IJ SOLN
0.2000 mg | INTRAMUSCULAR | Status: DC | PRN
Start: 2023-04-01 — End: 2023-04-02

## 2023-04-01 MED ORDER — MENTHOL 3 MG MT LOZG: 1.0000 | LOZENGE | OROMUCOSAL | Status: DC | PRN

## 2023-04-01 MED ORDER — OXYTOCIN-SODIUM CHLORIDE 30-0.9 UT/500ML-% IV SOLN
2.5000 [IU]/h | INTRAVENOUS | Status: AC
  Administered 2023-04-01: 2.5 [IU]/h via INTRAVENOUS
  Filled 2023-04-01: qty 500

## 2023-04-01 MED ORDER — ZOLPIDEM TARTRATE 5 MG PO TABS: 5.0000 mg | ORAL_TABLET | Freq: Every evening | ORAL | Status: DC | PRN

## 2023-04-01 MED ORDER — SIMETHICONE 80 MG PO CHEW: 80.0000 mg | CHEWABLE_TABLET | ORAL | Status: DC | PRN

## 2023-04-01 MED ORDER — OXYCODONE-ACETAMINOPHEN 5-325 MG PO TABS
1.0000 | ORAL_TABLET | ORAL | Status: DC | PRN
  Administered 2023-04-01 (×2): 2 via ORAL
  Administered 2023-04-01: 1 via ORAL
  Administered 2023-04-02 (×3): 2 via ORAL
  Filled 2023-04-01 (×6): qty 2

## 2023-04-01 MED ORDER — LACTATED RINGERS IV BOLUS
1000.0000 mL | Freq: Once | INTRAVENOUS | Status: AC
  Administered 2023-04-01: 1000 mL via INTRAVENOUS

## 2023-04-01 NOTE — Lactation Note (Signed)
This note was copied from a baby's chart. Lactation Consultation Note  Patient Name: [name] Young [id number] Date: 04/01/2023 Age:30 hours  Formula fed.   Maternal Data    Feeding Nipple Type: Slow - flow  LATCH Score                    Lactation Tools Discussed/Used    Interventions    Discharge    Consult Status Consult Status: Complete    Ticia Virgo G 04/01/2023, 12:46 AM

## 2023-04-01 NOTE — Progress Notes (Signed)
Subjective: Postpartum Day 1: Cesarean Delivery Patient reports incisional pain and tolerating PO.    Objective: Vital signs in last 24 hours: Temp:  [97.3 F (36.3 C)-98.3 F (36.8 C)] 98.3 F (36.8 C) (12/01 0752) Pulse Rate:  [56-96] 58 (12/01 0752) Resp:  [0-30] 16 (12/01 0752) BP: (104-157)/(62-106) 130/75 (12/01 0752) SpO2:  [94 %-100 %] 98 % (12/01 0752) Weight:  [76.7 kg] 76.7 kg (11/30 1427)  Physical Exam:  General: alert, cooperative, and no distress Lochia: appropriate Uterine Fundus: firm Incision: healing well, no significant drainage, no dehiscence, no significant erythema DVT Evaluation: No evidence of DVT seen on physical exam.  Recent Labs    03/31/23 1511  HGB 9.7*  HCT 31.6*    Assessment/Plan: Status post Cesarean section. Doing well postoperatively.  Continue current care  Continue Procardia xl 60 + lasix 20 mg BID CBC pending from this am.  Lazaro Arms, MD 04/01/2023, 9:12 AM

## 2023-04-01 NOTE — Anesthesia Postprocedure Evaluation (Signed)
Anesthesia Post Note  Patient: [name] Young  Procedure(s) Performed: CESAREAN SECTION     Patient location during evaluation: PACU Anesthesia Type: Spinal Level of consciousness: awake Pain management: pain level controlled Vital Signs Assessment: post-procedure vital signs reviewed and stable Respiratory status: spontaneous breathing, respiratory function stable and nonlabored ventilation Cardiovascular status: blood pressure returned to baseline and stable Postop Assessment: no headache, no backache and no apparent nausea or vomiting Anesthetic complications: no   No notable events documented.  Last Vitals:  Vitals:   04/01/23 0000 04/01/23 0025  BP: 122/82 (!) 141/80  Pulse: 64 (!) 56  Resp:  17  Temp:  (!) 36.3 C  SpO2: 96% 94%    Last Pain:  Vitals:   04/01/23 0025  TempSrc: Axillary  PainSc: 5    Pain Goal:    LLE Motor Response: Non-purposeful movement (04/01/23 0000) LLE Sensation: Numbness (04/01/23 0000) RLE Motor Response: Non-purposeful movement (04/01/23 0000) RLE Sensation: Numbness (04/01/23 0000)     Epidural/Spinal Function Cutaneous sensation: Tingles (04/01/23 0000), Patient able to flex knees: Yes (04/01/23 0000), Patient able to lift hips off bed: No (04/01/23 0000), Back pain beyond tenderness at insertion site: No (04/01/23 0000), Progressively worsening motor and/or sensory loss: No (04/01/23 0000), Bowel and/or bladder incontinence post epidural: No (04/01/23 0000)  Linton Rump

## 2023-04-01 NOTE — Clinical Social Work Maternal (Signed)
CLINICAL SOCIAL WORK MATERNAL/CHILD NOTE  Patient Details  Name: [name] Young MRN: [id number] Date of Birth: Mar 11, 1991  Date:  04/01/2023  Clinical Social Worker Initiating Note:  Jimmy Picket, [place] Date/Time: Initiated:  04/01/23/1051     Child's Name:  No name given   Biological Parents:  Mother   Need for Interpreter:  None   Reason for Referral:  Adoption, Late or No Prenatal Care  , Current Substance Use/Substance Use During Pregnancy     Address:  [address] [place] [address]    Phone number:  [phone] (home)     Additional phone number: [phone] preferred phone number  Household Members/Support Persons (HM/SP):   Household Member/Support Person 1, Household Member/Support Person 2, Household Member/Support Person 3   HM/SP Name Relationship DOB or Age  HM/SP -1 [name] would not disclose [name] would not disclose [name] would not disclose  HM/SP -2 [name] Young daughter 41, 02/18/14  HM/SP -3 [name] Young daughter 28, 12/05/16  HM/SP -4        HM/SP -5        HM/SP -6        HM/SP -7        HM/SP -8          Natural Supports (not living in the home):  Other (Comment) ([name] reports no support system)   Professional Supports: None   Employment: Unemployed   Type of Work:     Education:  Some Materials engineer arranged:    Surveyor, quantity Resources:  Medicaid   Other Resources:      Cultural/Religious Considerations Which May Impact Care:    Strengths:  Ability to meet basic needs     Psychotropic Medications:         Pediatrician:       Pediatrician List:   [employer]    [place]    [facility]      Pediatrician Fax Number:    Risk Factors/Current Problems:  Substance Use  , Mental Health Concerns  , Family/Relationship Issues  , DHHS Involvement     Cognitive State:  Alert     Mood/Affect:  Anxious  , Tearful  , Sad  , Interested     CSW Assessment:  CSW met with [name] at bedside. [name] is choosing adoption for her baby. [name] has chosen not to give baby a name. [name] has chosen not to see baby. Per [name], FOB denies the baby is his, [name] wants to keep him unknown. [name] has not been working with any adoption agencies and has made no arrangements. CSW gave [name] list of agencies. CSW explained that [name] will have to make contact with an agencies to start the process.   [name] lives at home with her 2 daughters ages 52 and 18. [name] lives with another adult but did not want to disclose any information about that person. [name] has given 2 other children up for adoption (ages 43 and 4).   CSW and [name] discussed hospital drug policy. [name] was positive for cocaine at admission. Baby has also tested positive for cocaine. CSW and [name] also discussed lack of PNC. CSW explained that there will be a CPS report made. [name] states she understands.   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 [name] reports she also has been diagnosed with depression and has been on zoloft and celexa in  the past. [name] reports wanted to see a therapist. CSW gave [name] list of providers in the area.   CSW made [name] report to [employer]. CSW gave name and phone number of weekday social worker for [name] Young to follow up.   CSW Plan/Description:  Perinatal Mood and Anxiety Disorder (PMADs) Education, Hospital Drug Screen Policy Information, Child Protective Service Report  , Other (Adoption)    Jimmy Picket, LCSW 04/01/2023, 11:02 AM

## 2023-04-02 ENCOUNTER — Encounter (HOSPITAL_COMMUNITY): Payer: Self-pay | Admitting: Obstetrics & Gynecology

## 2023-04-02 ENCOUNTER — Other Ambulatory Visit (HOSPITAL_COMMUNITY): Payer: Self-pay

## 2023-04-02 LAB — CULTURE, BETA STREP (GROUP B ONLY)

## 2023-04-02 LAB — GC/CHLAMYDIA PROBE AMP (~~LOC~~) NOT AT ARMC
Chlamydia: NEGATIVE
Comment: NEGATIVE
Comment: NORMAL
Neisseria Gonorrhea: NEGATIVE

## 2023-04-02 LAB — RH IG WORKUP (INCLUDES ABO/RH)
ABO/RH(D): B NEG
Antibody Screen: NEGATIVE
Fetal Screen: NEGATIVE
Gestational Age(Wks): 36
Unit division: 0

## 2023-04-02 MED ORDER — FERROUS SULFATE 325 (65 FE) MG PO TABS
325.0000 mg | ORAL_TABLET | Freq: Every day | ORAL | Status: DC
  Administered 2023-04-02: 325 mg via ORAL
  Filled 2023-04-02: qty 1

## 2023-04-02 MED ORDER — CITALOPRAM HYDROBROMIDE 20 MG PO TABS
ORAL_TABLET | ORAL | 0 refills | Status: AC
  Filled 2023-04-02: qty 25, 28d supply, fill #0

## 2023-04-02 MED ORDER — NIFEDIPINE ER 30 MG PO TB24
30.0000 mg | ORAL_TABLET | Freq: Every day | ORAL | 1 refills | Status: AC
  Filled 2023-04-02: qty 30, 30d supply, fill #0

## 2023-04-02 MED ORDER — SENNOSIDES-DOCUSATE SODIUM 8.6-50 MG PO TABS
2.0000 | ORAL_TABLET | Freq: Every evening | ORAL | 0 refills | Status: AC | PRN
  Filled 2023-04-02: qty 30, 15d supply, fill #0

## 2023-04-02 MED ORDER — FERROUS SULFATE 325 (65 FE) MG PO TABS
325.0000 mg | ORAL_TABLET | ORAL | 0 refills | Status: AC
  Filled 2023-04-02: qty 90, 180d supply, fill #0

## 2023-04-02 MED ORDER — OXYCODONE HCL 5 MG PO TABS
5.0000 mg | ORAL_TABLET | ORAL | 0 refills | Status: AC | PRN
  Filled 2023-04-02: qty 15, 3d supply, fill #0

## 2023-04-02 MED ORDER — FUROSEMIDE 20 MG PO TABS
20.0000 mg | ORAL_TABLET | Freq: Two times a day (BID) | ORAL | 0 refills | Status: AC
  Filled 2023-04-02: qty 10, 5d supply, fill #0

## 2023-04-02 MED ORDER — ACETAMINOPHEN 500 MG PO TABS
1000.0000 mg | ORAL_TABLET | Freq: Three times a day (TID) | ORAL | 0 refills | Status: AC | PRN
  Filled 2023-04-02: qty 60, 10d supply, fill #0

## 2023-04-02 NOTE — Progress Notes (Signed)
CSW met with [name] Young at bedside to follow up, female guest present. CSW introduced self and asked to speak with [name] Young privately, female guest left the room.   CSW explained reason for visit. [name] Young confirmed that she wanted to complete an adoption and reported that she was not coerced into this plan. CSW went over [name] Young's rights. [name] Young selected [facility] adoption agency. CSW assisted [name] Young in calling the adoption agency, no answer. CSW left voicemail requesting a return call. [name] Young denied any questions.   CSW received consult due to score 16 on Edinburgh Depression Screen. CSW and [name] Young discussed elevated edinburgh score. [name] Young attributed elevated score to mental health symptoms and the decision to proceed with the adoption. CSW provided emotional support and encouraging words. CSW and [name] Young discussed [name] Young's mental health history. [name] Young endorsed a history of anxiety and postpartum depression dating back 9 years ago after her first pregnancy. [name] Young reported that she took medication which was somewhat helpful. [name] Young shared that she had postpartum depression after each pregnancy with last being in 2019. [name] Young reported that she participated in therapy and shared that it was not helpful. CSW and [name] Young discussed the benefits of therapy, [name] Young reported that she is interested in resources. CSW provided local mental health resource. [name] Young shared that she also has a history of inpatient psychiatric hospitalization in 2022 due to an acute situation, [name] Young shared that it was not effective at all and they only kept her for 3 days. [name] Young endorsed current depressive symptoms and shared that her postpartum depression transitioned into general depression. [name] Young described her depression as sadness, isolation, feeling hopeless, lack of motivation, over thinking, dwelling, and being hard on herself. CSW inquired about how [name] Young was feeling emotionally since giving birth, [name] Young reported that she is not in a good place mentally noting she is not suicidal. CSW  acknowledged and validated [name] Young's feelings. [name] Young presented calm and did not demonstrate any acute mental health signs/symptoms. CSW assessed for safety, [name] Young denied SI, HI, and domestic violence. CSW inquired about [name] Young's support system, [name] Young denied having any supports. CSW asked who had [name] Young's older children, [name] Young reported that they are with their paternal grandmother. CSW asked if she was a support, [name] Young reported in the capacity of caring for her children. CSW encouraged [name] Young to follow up with provided therapy resource as she is currently having mental health symptoms and proceeding with an adoption. CSW and [name] Young discussed that adoption agencies sometimes offer therapy services.   CSW provided education regarding Baby Blues vs PMADs and provided [name] Young with resources for mental health follow up.  CSW encouraged [name] Young to evaluate her mental health throughout the postpartum period with the use of the New Mom Checklist developed by [employer]partum [employer] as well as the [place] Postnatal Depression Scale and notify a medical professional if symptoms arise.   CSW awaiting return call from selected adoption agency.   Celso Sickle, LCSW Clinical Social Worker [facility] Cell#: [phone]

## 2023-04-02 NOTE — Discharge Summary (Signed)
Postpartum Discharge Summary    Patient Name: [name] Young DOB: 1990/11/08 MRN: [id number]  Date of admission: 03/31/2023 Delivery date:03/31/2023 Delivering provider: Lazaro Arms Date of discharge: 04/02/2023  Admitting diagnosis: S/P cesarean section [Z98.891] Intrauterine pregnancy: [redacted]w[redacted]d     Secondary diagnosis:  Principal Problem:   S/P cesarean section  Additional problems: Substance (cocaine) use disorder Postpartum hypertension Rubella non immune    Discharge diagnosis: Preterm Pregnancy Delivered                                              Post partum procedures:rhogam Augmentation: N/A Complications: None  Hospital course: Sceduled C/S   32 y.o. yo [id number] at [redacted]w[redacted]d was admitted to the hospital 03/31/2023 for PPROM. She underwent uncomplicated repeat cesarean delivery. Delivery details are as follows:  Membrane Rupture Time/Date: 10:00 AM,03/31/2023  Delivery Method:C-Section, Low Transverse Operative Delivery:N/A Details of operation can be found in separate operative note.  Patient had a postpartum course complicated by the following: Postpartum hypertension- BP controlled with procardia 30 daily. Plan for lasix x 5d PP. Did not need mag   Substance use disorder, anxiety/depression- Restarted celexa as pt had good control of mental health concerns with it in the past. Seen by SW and resources provided. Denies safety concerns, SI/HI Anemia - po iron ordered, asymptomatic  Of note, patient's baby is up for adoption.   She is ambulating, tolerating a regular diet, passing flatus, and urinating well. Patient is discharged home in stable condition on  04/02/23        Newborn Data: Birth date:03/31/2023 Birth time:10:38 PM Gender:Female Living status:Living Apgars: ,  Weight:2000 g    Magnesium Sulfate received: No BMZ received: No Rhophylac:Yes MMR:No T-DaP: No Flu: No RSV Vaccine received: No Transfusion:No  Immunizations received: There is no  immunization history for the selected administration types on file for this patient.  Physical exam  Vitals:   04/02/23 0416 04/02/23 0829 04/02/23 1247 04/02/23 1548  BP: (!) 114/56 132/83 (!) 135/93 139/88  Pulse: 96 83 93 (!) 101  Resp: 16 16 17 18   Temp: 98.3 F (36.8 C) 98.1 F (36.7 C) 98.1 F (36.7 C) 97.7 F (36.5 C)  TempSrc: Oral Oral Oral Oral  SpO2: 96% 98% 100% 95%  Weight:      Height:       General: alert, cooperative, and no distress Lochia: appropriate Uterine Fundus: firm Incision: Dressing is clean, dry, and intact DVT Evaluation: No evidence of DVT seen on physical exam. Labs: Lab Results  Component Value Date   WBC 8.7 04/01/2023   HGB 7.6 (L) 04/01/2023   HCT 25.9 (L) 04/01/2023   MCV 77.3 (L) 04/01/2023   PLT 340 04/01/2023      Latest Ref Rng & Units 04/01/2023    8:56 AM  CMP  Glucose 70 - 99 mg/dL 147   BUN 6 - 20 mg/dL 6   Creatinine 8.29 - 5.62 mg/dL 1.30   Sodium 865 - 784 mmol/L 135   Potassium 3.5 - 5.1 mmol/L 4.4   Chloride 98 - 111 mmol/L 108   CO2 22 - 32 mmol/L 21   Calcium 8.9 - 10.3 mg/dL 8.3   Total Protein 6.5 - 8.1 g/dL 5.2   Total Bilirubin <6.9 mg/dL 0.6   Alkaline Phos 38 - 126 U/L 272   AST 15 - 41 U/L  23   ALT 0 - 44 U/L 11    Edinburgh Score:    04/01/2023    1:20 PM  Edinburgh Postnatal Depression Scale Screening Tool  I have been able to laugh and see the funny side of things. 1  I have looked forward with enjoyment to things. 1  I have blamed myself unnecessarily when things went wrong. 3  I have been anxious or worried for no good reason. 2  I have felt scared or panicky for no good reason. 2  Things have been getting on top of me. 2  I have been so unhappy that I have had difficulty sleeping. 2  I have felt sad or miserable. 2  I have been so unhappy that I have been crying. 1  The thought of harming myself has occurred to me. 0  Edinburgh Postnatal Depression Scale Total 16   Edinburgh Postnatal  Depression Scale Total: (!) 16   After visit meds:  Allergies as of 04/02/2023       Reactions   Ace Inhibitors Swelling   Nsaids Swelling        Medication List     TAKE these medications    Acetaminophen Extra Strength 500 MG Tabs Take 2 tablets (1,000 mg total) by mouth every 8 (eight) hours as needed.   Blood Pressure Cuff Misc 1 Device by Does not apply route once a week.   citalopram 20 MG tablet Commonly known as: CeleXA Take 0.5 tablets (10 mg total) by mouth daily for 7 days, THEN 1 tablet (20 mg total) daily for 21 days. Start taking on: April 02, 2023   FeroSul 325 (65 Fe) MG tablet Generic drug: ferrous sulfate Take 1 tablet (325 mg total) by mouth every other day.   furosemide 20 MG tablet Commonly known as: LASIX Take 1 tablet (20 mg total) by mouth 2 (two) times daily for 5 days.   NIFEdipine 30 MG 24 hr tablet Commonly known as: ADALAT CC Take 1 tablet (30 mg total) by mouth daily.   oxyCODONE 5 MG immediate release tablet Commonly known as: Roxicodone Take 1 tablet (5 mg total) by mouth every 4 (four) hours as needed for breakthrough pain.   prenatal multivitamin Tabs tablet Take 1 tablet by mouth daily at 12 noon.   Senna-S 8.6-50 MG tablet Generic drug: senna-docusate Take 2 tablets by mouth at bedtime as needed for mild constipation.   valACYclovir 500 MG tablet Commonly known as: VALTREX Take 500 mg by mouth 2 (two) times daily.               Discharge Care Instructions  (From admission, onward)           Start     Ordered   04/02/23 0000  Discharge wound care:       Comments: Remove honeycomb dressing within 5 days (by 12/7)   04/02/23 1422             Discharge home in stable condition Infant Feeding:  n/a Infant Disposition: adoption Discharge instruction: per After Visit Summary and Postpartum booklet. Activity: Advance as tolerated. Pelvic rest for 6 weeks.  Diet: routine diet Future  Appointments: Future Appointments  Date Time Provider Department Center  04/09/2023  1:20 PM [facility] NURSE [facility] None   Follow up Visit:  Follow-up Information     [facility] for [facility] at [facility] Follow up in 1 week(s).   Specialty: Obstetrics and Gynecology Why: Please follow up for  blood pressure & incision check within 1 week Contact information: [address] [place] [address] [phone]        [facility] for [facility] at [facility] Follow up in 4 week(s).   Specialty: Obstetrics and Gynecology Why: Please follow up for postpartum care in 4 weeks Contact information: [address], [address] [place] [address] [phone]                 Please schedule this patient for a In person postpartum visit in 6 weeks with the following provider: Any provider. Additional Postpartum F/U:Postpartum Depression checkup, Incision check 1 week, and BP check 1 week  High risk pregnancy complicated by: HTN Delivery mode:  C-Section, Low Transverse Anticipated Birth Control:  Depo   04/02/2023 Lennart Pall, MD

## 2023-04-02 NOTE — Progress Notes (Addendum)
Post Partum Day 2 s/p rCS Subjective: up ad lib, voiding, tolerating PO, and + flatus Reports ongoing pain but better w/ meds. Bleeding improving  Objective: Blood pressure 132/83, pulse 83, temperature 98.1 F (36.7 C), temperature source Oral, resp. rate 16, height 5\' 3"  (1.6 m), weight 76.7 kg, last menstrual period 07/21/2022, SpO2 98%, unknown if currently breastfeeding.  Physical Exam:  General: alert, cooperative, and no distress Lochia: appropriate Uterine Fundus: firm Incision: honeycomb dressing in place, clean dry & intact DVT Evaluation: No evidence of DVT seen on physical exam.  Recent Labs    03/31/23 1511 04/01/23 0856  HGB 9.7* 7.6*  HCT 31.6* 25.9*    Assessment/Plan: Postpartum - Contraception: Depo - MOF: n/a - Rh status: Rh neg s/p rhogam 12/1 - Rubella status: RNI - MMR offered - Dispo: likely discharge POD2-3 - Consults: lactation, SW  Neonatal - not discussed with patient today, up for adoption  3. Postpartum hypertension Well controlled with procardia 30 daily Continue lasix 20 BID Plan for M2B, baby scripts  4. Substance use disorder SW consult Not specifically addressed this AM, pt had visitor in room  5. Acute blood loss anemia Asymptomatic Po iron ordered  6. Positive depression screen EPDS 16 SW consulted Has previously been on celexa with good control of her anxiety/depression. She would like to restart. Rx sent    LOS: 2 days   [name] Young 04/02/2023, 8:49 AM

## 2023-04-02 NOTE — Progress Notes (Signed)
[employer] representative Roslyn Smiling.) contacted CSW and provided contact information for [name] to follow up with.   CSW contacted [name] via telephone and provided update after confirming that she could speak confidentially.   [name] to follow up with [employer] directly to discuss adoption further.   Celso Sickle, LCSW Clinical Social Worker [facility] Cell#: [phone]

## 2023-04-02 NOTE — Progress Notes (Signed)
CSW followed up with [name] via telephone to inquire about meeting with identified adoption agency. [name] reported that she is scheduled to meet with [employer] representative at 5pm today to complete adoption paperwork.   CSW followed up with [employer] staff who confirmed that they are scheduled to meet with [name] at 5 pm today.   CSW updated RN. CSW requested that RN gather the following signed documents (Relinquishment of minor for adoption by parent or guardian or guardian ad litem of the mother/father;; Acceptance of relinquishment of minor for adoption by parent or guardian or guardian ad litem of mother/father). RN agreed to obtain signed documents from Marketing executive.   [facility] CPS social worker [employer] cabin crew) contacted CSW and reported that she is the assigned Child psychotherapist. CPS social worker requested an update on infant, CSW provided update.   CSW met with [name] and obtained signatures on patient request for access forms.   CSW inquired about any additional needs, [name] reported no additional needs.   [name] scheduled to complete adoption paperwork with [employer] today at 5 pm.   Celso Sickle, LCSW Clinical Social Worker [facility] Cell#: [phone]

## 2023-04-02 NOTE — Progress Notes (Signed)
Pt refused MMR vaccine at this time. Pt still thinking about depo-provera injection.

## 2023-04-02 NOTE — Progress Notes (Signed)
CSW attempted to follow up with [name]; however, [name] was accompanied by a guest and requested that CSW return at a later time. CSW will attempt to meet with [name] later.   Celso Sickle, LCSW Clinical Social Worker [facility] Cell#: [phone]

## 2023-04-02 NOTE — Progress Notes (Signed)
Pt discharged home in stable condition after discharge instructions given. Pt verbalized understanding and all questions were answered. IV was discontinued and pt was sent home with all belongings.

## 2023-04-02 NOTE — Progress Notes (Signed)
Adoption agency, [employer], present at patient's bedside.

## 2023-04-03 ENCOUNTER — Encounter: Payer: Medicaid Other | Admitting: Obstetrics and Gynecology

## 2023-04-04 LAB — SURGICAL PATHOLOGY

## 2023-04-09 ENCOUNTER — Institutional Professional Consult (permissible substitution): Payer: Medicaid Other | Admitting: Licensed Clinical Social Worker

## 2023-04-09 ENCOUNTER — Other Ambulatory Visit (HOSPITAL_COMMUNITY): Payer: Self-pay

## 2023-04-09 ENCOUNTER — Ambulatory Visit: Payer: Medicaid Other

## 2023-04-10 ENCOUNTER — Telehealth (HOSPITAL_COMMUNITY): Payer: Self-pay | Admitting: *Deleted

## 2023-04-10 NOTE — Telephone Encounter (Signed)
04/10/2023  Name: [name] Young MRN: [id number] DOB: 12-05-1990  Reason for Call:  Transition of Care Hospital Discharge Call  Contact Status: Patient Contact Status: Complete  Language assistant needed: Interpreter Mode: Interpreter Not Needed        Follow-Up Questions: Do You Have Any Concerns About Your Health As You Heal From Delivery?: Yes What Concerns Do You Have About Your Health?: Incisional area feels numb.  Patient said she missed a nurse appointment yesterday.  Advised that numbness around the incision can be normal. She denies fever, redness, heat, or drainage at the incision site.  Edinburgh Postnatal Depression Scale:  In the Past 7 Days: I have been able to laugh and see the funny side of things.: Definitely not so much now I have looked forward with enjoyment to things.: Definitely less than I used to I have blamed myself unnecessarily when things went wrong.: Yes, most of the time I have been anxious or worried for no good reason.: Yes, sometimes I have felt scared or panicky for no good reason.: Yes, sometimes Things have been getting on top of me.: Yes, most of the time I haven't been able to cope at all I have been so unhappy that I have had difficulty sleeping.: Yes, sometimes I have felt sad or miserable.: Yes, most of the time I have been so unhappy that I have been crying.: Yes, most of the time The thought of harming myself has occurred to me.: Hardly ever [place] Postnatal Depression Scale Total: (!) 23  Patient is interested in rescheduling [facility] appointment. Sent InBasket to [employer], CSW with EPDS score and patient's desire to reschedule.    Patient says she has picked up Celexa prescription.  Spoke to Dr. Macon Large and shared above information.    PHQ2-9 Depression Scale:     Discharge Follow-up: Edinburgh score requires follow up?: Yes Provider notified of Edinburgh score?: Yes Have you already been referred for a counseling  appointment?: Yes Date of appointment:: 04/09/23 Time of previous appointment:: 1400 If appointment has already passed, how did it go?: Patient missed the appointment  Post-discharge interventions: Maternal Mental Health Resources provided  Salena Saner, RN 04/10/2023 15:48

## 2023-04-13 ENCOUNTER — Telehealth: Payer: Self-pay | Admitting: Licensed Clinical Social Worker

## 2023-04-13 NOTE — Telephone Encounter (Signed)
[facility] contacted patient on this date to reschedule missed appointment as requested by nurse Salena Saner. [facility] left a VM and encouraged patient to return the call.

## 2023-04-21 ENCOUNTER — Encounter (HOSPITAL_COMMUNITY)
Admission: AD | Disposition: A | Discharge status: Home / Self Care | Payer: Self-pay | Source: Home / Self Care | Attending: Obstetrics and Gynecology

## 2023-04-21 SURGERY — Surgical Case
Anesthesia: Regional

## 2023-05-15 ENCOUNTER — Ambulatory Visit: Payer: Medicaid Other | Admitting: Advanced Practice Midwife

## 2023-05-28 ENCOUNTER — Ambulatory Visit: Payer: Medicaid Other

## 2023-06-05 ENCOUNTER — Emergency Department (HOSPITAL_COMMUNITY)
Admission: EM | Admit: 2023-06-05 | Discharge: 2023-06-05 | Disposition: A | Discharge status: Home / Self Care | Payer: No Typology Code available for payment source | Attending: Emergency Medicine | Admitting: Emergency Medicine

## 2023-06-05 ENCOUNTER — Other Ambulatory Visit: Payer: Self-pay

## 2023-06-05 ENCOUNTER — Emergency Department (HOSPITAL_COMMUNITY): Payer: No Typology Code available for payment source

## 2023-06-05 ENCOUNTER — Encounter (HOSPITAL_COMMUNITY): Payer: Self-pay

## 2023-06-05 DIAGNOSIS — S40012A Contusion of left shoulder, initial encounter: Secondary | ICD-10-CM | POA: Diagnosis not present

## 2023-06-05 DIAGNOSIS — M25512 Pain in left shoulder: Secondary | ICD-10-CM | POA: Diagnosis not present

## 2023-06-05 DIAGNOSIS — R932 Abnormal findings on diagnostic imaging of liver and biliary tract: Secondary | ICD-10-CM | POA: Diagnosis not present

## 2023-06-05 DIAGNOSIS — Y92410 Unspecified street and highway as the place of occurrence of the external cause: Secondary | ICD-10-CM | POA: Insufficient documentation

## 2023-06-05 DIAGNOSIS — R109 Unspecified abdominal pain: Secondary | ICD-10-CM | POA: Diagnosis not present

## 2023-06-05 DIAGNOSIS — S301XXA Contusion of abdominal wall, initial encounter: Secondary | ICD-10-CM | POA: Diagnosis not present

## 2023-06-05 DIAGNOSIS — R079 Chest pain, unspecified: Secondary | ICD-10-CM | POA: Diagnosis not present

## 2023-06-05 DIAGNOSIS — S4992XA Unspecified injury of left shoulder and upper arm, initial encounter: Secondary | ICD-10-CM | POA: Diagnosis not present

## 2023-06-05 DIAGNOSIS — S20212A Contusion of left front wall of thorax, initial encounter: Secondary | ICD-10-CM | POA: Insufficient documentation

## 2023-06-05 DIAGNOSIS — S3991XA Unspecified injury of abdomen, initial encounter: Secondary | ICD-10-CM | POA: Diagnosis present

## 2023-06-05 LAB — CBC
HCT: 29.6 % — ABNORMAL LOW (ref 36.0–46.0)
Hemoglobin: 7.9 g/dL — ABNORMAL LOW (ref 12.0–15.0)
MCH: 17.6 pg — ABNORMAL LOW (ref 26.0–34.0)
MCHC: 26.7 g/dL — ABNORMAL LOW (ref 30.0–36.0)
MCV: 65.9 fL — ABNORMAL LOW (ref 80.0–100.0)
Platelets: 451 10*3/uL — ABNORMAL HIGH (ref 150–400)
RBC: 4.49 MIL/uL (ref 3.87–5.11)
RDW: 21.8 % — ABNORMAL HIGH (ref 11.5–15.5)
WBC: 6.4 10*3/uL (ref 4.0–10.5)
nRBC: 0 % (ref 0.0–0.2)

## 2023-06-05 LAB — BASIC METABOLIC PANEL
Anion gap: 10 (ref 5–15)
BUN: 10 mg/dL (ref 6–20)
CO2: 23 mmol/L (ref 22–32)
Calcium: 9.1 mg/dL (ref 8.9–10.3)
Chloride: 102 mmol/L (ref 98–111)
Creatinine, Ser: 0.8 mg/dL (ref 0.44–1.00)
GFR, Estimated: >60 mL/min (ref >60)
Glucose, Bld: 85 mg/dL (ref 70–99)
Potassium: 4 mmol/L (ref 3.5–5.1)
Sodium: 135 mmol/L (ref 135–145)

## 2023-06-05 MED ORDER — IOHEXOL 300 MG/ML  SOLN
100.0000 mL | Freq: Once | INTRAMUSCULAR | Status: AC | PRN
  Administered 2023-06-05: 100 mL via INTRAVENOUS
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
## 2023-06-05 NOTE — Discharge Instructions (Signed)
 RICE therapy:  Apply ice wrapped in a towel intermittently keeping it on the skin no longer than 10 minutes a couple of times an hour  Elevate the affected extremity to help reduce blood flow and prevent swelling  Use an anti-inflammatory if you are not allergic to it such as ibuprofen  or Naprosyn to help with pain and swelling  Use a compressive device whether it is an Ace wrap or other  immobilizer to help minimize movement and compress the swelling.  If you cannot take anti-inflammatories because of allergies then you should take Tylenol  every 6 hours maximum 1000 mg per dose  All of your x-rays and CT scans were good showing no signs of broken bones or internal injuries  Thank you for allowing us  to treat you in the emergency department today.  After reviewing your examination and potential testing that was done it appears that you are safe to go home.  I would like for you to follow-up with your doctor within the next several days, have them obtain your records and follow-up with them to review all potential tests and results from your visit.  If you should develop severe or worsening symptoms return to the emergency department immediately

## 2023-11-09 ENCOUNTER — Ambulatory Visit
Admission: EM | Admit: 2023-11-09 | Discharge: 2023-11-09 | Disposition: A | Discharge status: Home / Self Care | Attending: Family Medicine | Admitting: Family Medicine

## 2023-11-09 DIAGNOSIS — R0781 Pleurodynia: Secondary | ICD-10-CM | POA: Diagnosis not present

## 2023-11-09 MED ORDER — LIDOCAINE 5 % EX PTCH: 1.0000 | MEDICATED_PATCH | CUTANEOUS | 0 refills | Status: AC

## 2023-11-09 MED ORDER — PREDNISONE 20 MG PO TABS: 40.0000 mg | ORAL_TABLET | Freq: Every day | ORAL | 0 refills | Status: AC

## 2023-11-09 MED ORDER — TIZANIDINE HCL 4 MG PO CAPS: 4.0000 mg | ORAL_CAPSULE | Freq: Three times a day (TID) | ORAL | 0 refills | Status: AC | PRN

## 2023-11-09 NOTE — Discharge Instructions (Signed)
 I suspect a muscle strain to be causing your symptoms.  We have sent over an anti-inflammatory medication, muscle relaxer and numbing patches to apply topically to the area.  You may also do heat, massage, gentle stretches as able.

## 2023-11-09 NOTE — ED Triage Notes (Signed)
 Pt states she was stretching this morning and started having pain to her right anterior ribs. States it is worse when she moves.

## 2023-11-13 NOTE — ED Provider Notes (Signed)
 [facility]    CSN: [id number] Arrival date & time: 11/09/23  1850      History   Chief Complaint Chief Complaint  Patient presents with   Rib Injury    HPI [name] Young is a 33 y.o. female.   Presenting today with right anterior rib pain that started while stretching this morning. Denies bruising or swelling to the area, SOB, wheezing, palpitations, dizziness, known direct injury to the area. Worse with movement or deep breaths. Trying OTC remedies with minimal relief.     Past Medical History:  Diagnosis Date   Depression    Hx MRSA infection    Pregnancy induced hypertension     Patient Active Problem List   Diagnosis Date Noted   S/P cesarean section 04/01/2023   Late prenatal care 03/27/2023   Supervision of other normal pregnancy, antepartum 10/09/2022   History of severe pre-eclampsia 11/27/2017   History of cesarean delivery 11/12/2017   Rubella non-immune status, antepartum 12/24/2014   Vitamin D  deficiency 08/24/2013    Past Surgical History:  Procedure Laterality Date   CESAREAN SECTION N/A 02/17/2014   Procedure: CESAREAN SECTION;  Surgeon: Gloris DELENA Hugger, MD;  Location: [facility];  Service: Obstetrics;  Laterality: N/A;   CESAREAN SECTION N/A 01/08/2015   Procedure: CESAREAN SECTION;  Surgeon: Gloris DELENA Hugger, MD;  Location: [facility];  Service: Obstetrics;  Laterality: N/A;   CESAREAN SECTION N/A 12/05/2016   Procedure: CESAREAN SECTION;  Surgeon: Barbra Lang PARAS, DO;  Location: [facility] BIRTHING SUITES;  Service: Obstetrics;  Laterality: N/A;   CESAREAN SECTION N/A 11/27/2017   Procedure: CESAREAN SECTION;  Surgeon: Barbra Lang PARAS, DO;  Location: [facility] BIRTHING SUITES;  Service: Obstetrics;  Laterality: N/A;   CESAREAN SECTION N/A 03/31/2023   Procedure: CESAREAN SECTION;  Surgeon: Jayne Vonn DEL, MD;  Location: [facility];  Service: Obstetrics;  Laterality: N/A;    OB History     Gravida  8   Para  5   Term  2   Preterm  3   AB  3    Living  5      SAB  0   IAB  3   Ectopic  0   Multiple  0   Live Births  5            Home Medications    Prior to Admission medications   Medication Sig Start Date End Date Taking? Authorizing Provider  lidocaine  (LIDODERM ) 5 % Place 1 patch onto the skin daily. Remove & Discard patch within 12 hours or as directed by MD 11/09/23  Yes Stuart Vernell Norris, PA-C  predniSONE  (DELTASONE ) 20 MG tablet Take 2 tablets (40 mg total) by mouth daily with breakfast. 11/09/23  Yes Stuart Vernell Norris, PA-C  tiZANidine  (ZANAFLEX ) 4 MG capsule Take 1 capsule (4 mg total) by mouth 3 (three) times daily as needed for muscle spasms. Do not drink alcohol or drive while taking this medication.  May cause drowsiness. 11/09/23  Yes Stuart Vernell Norris, PA-C  acetaminophen  (TYLENOL ) 500 MG tablet Take 2 tablets (1,000 mg total) by mouth every 8 (eight) hours as needed. 04/02/23   Erik Kieth BROCKS, MD  Blood Pressure Monitoring (BLOOD PRESSURE CUFF) MISC 1 Device by Does not apply route once a week. 10/09/22   Ervin, Michael L, MD  citalopram  (CELEXA ) 20 MG tablet Take 0.5 tablets (10 mg total) by mouth daily for 7 days, THEN 1 tablet (20 mg total) daily for 21  days. 04/02/23 04/30/23  Erik Kieth BROCKS, MD  ferrous sulfate  325 (65 FE) MG tablet Take 1 tablet (325 mg total) by mouth every other day. 04/02/23   Erik Kieth BROCKS, MD  furosemide  (LASIX ) 20 MG tablet Take 1 tablet (20 mg total) by mouth 2 (two) times daily for 5 days. 04/02/23 04/07/23  Erik Kieth BROCKS, MD  NIFEdipine  (ADALAT  CC) 30 MG 24 hr tablet Take 1 tablet (30 mg total) by mouth daily. 04/02/23   Erik Kieth BROCKS, MD  oxyCODONE  (ROXICODONE ) 5 MG immediate release tablet Take 1 tablet (5 mg total) by mouth every 4 (four) hours as needed for breakthrough pain. 04/02/23   Erik Kieth BROCKS, MD  Prenatal Vit-Fe Fumarate-FA (PRENATAL MULTIVITAMIN) TABS tablet Take 1 tablet by mouth daily at 12 noon.    [provider]  senna-docusate (SENOKOT-S) 8.6-50 MG tablet Take 2 tablets by mouth at bedtime as needed for mild constipation. 04/02/23   Erik Kieth BROCKS, MD  valACYclovir (VALTREX) 500 MG tablet Take 500 mg by mouth 2 (two) times daily.    [provider]    Family History Family History  Problem Relation Age of Onset   Hypertension Mother    Hypertension Father     Social History Social History   Tobacco Use   Smoking status: Every Day    Types: Cigarettes   Smokeless tobacco: Never  Vaping Use   Vaping status: Never Used  Substance Use Topics   Alcohol use: Not Currently   Drug use: Not Currently    Types: Cocaine     Allergies   Ace inhibitors and Nsaids   Review of Systems Review of Systems PER HPI  Physical Exam Triage Vital Signs ED Triage Vitals  Encounter Vitals Group     BP 11/09/23 1924 114/74     Girls Systolic BP Percentile --      Girls Diastolic BP Percentile --      Boys Systolic BP Percentile --      Boys Diastolic BP Percentile --      Pulse Rate 11/09/23 1924 87     Resp 11/09/23 1924 16     Temp 11/09/23 1924 98.2 F (36.8 C)     Temp Source 11/09/23 1924 Oral     SpO2 11/09/23 1924 100 %     Weight --      Height --      Head Circumference --      Peak Flow --      Pain Score 11/09/23 1922 9     Pain Loc --      Pain Education --      Exclude from Growth Chart --    No data found.  Updated Vital Signs BP 114/74 (BP Location: Right Arm)   Pulse 87   Temp 98.2 F (36.8 C) (Oral)   Resp 16   LMP 10/15/2023 (Approximate)   SpO2 100%   Breastfeeding No   Visual Acuity Right Eye Distance:   Left Eye Distance:   Bilateral Distance:    Right Eye Near:   Left Eye Near:    Bilateral Near:     Physical Exam Vitals and nursing note reviewed.  Constitutional:      Appearance: Normal appearance. She is not ill-appearing.  HENT:     Head: Atraumatic.  Eyes:     Extraocular Movements: Extraocular movements intact.      Conjunctiva/sclera: Conjunctivae normal.  Cardiovascular:     Rate and Rhythm: Normal  rate and regular rhythm.     Heart sounds: Normal heart sounds.  Pulmonary:     Effort: Pulmonary effort is normal.     Breath sounds: Normal breath sounds.  Chest:     Chest wall: Tenderness (right anterior rib ttp with no point tenderness or bony deformity) present.  Musculoskeletal:        General: Normal range of motion.     Cervical back: Normal range of motion and neck supple.  Skin:    General: Skin is warm and dry.     Findings: No bruising or erythema.  Neurological:     Mental Status: She is alert and oriented to person, place, and time.  Psychiatric:        Mood and Affect: Mood normal.        Thought Content: Thought content normal.        Judgment: Judgment normal.      UC Treatments / Results  Labs (all labs ordered are listed, but only abnormal results are displayed) Labs Reviewed - No data to display  EKG   Radiology No results found.  Procedures Procedures (including critical care time)  Medications Ordered in UC Medications - No data to display  Initial Impression / Assessment and Plan / UC Course  I have reviewed the triage vital signs and the nursing notes.  Pertinent labs & imaging results that were available during my care of the patient were reviewed by me and considered in my medical decision making (see chart for details).     X-ray imaging deferred with shared decision making. Suspect muscular strain, treat with short course of prednisone , zanaflex , lidoderm , supportive OTC medications and home care. Return for worsening sxs.  Final Clinical Impressions(s) / UC Diagnoses   Final diagnoses:  Rib pain on right side     Discharge Instructions      I suspect a muscle strain to be causing your symptoms.  We have sent over an anti-inflammatory medication, muscle relaxer and numbing patches to apply topically to the area.  You may also do heat, massage,  gentle stretches as able.    ED Prescriptions     Medication Sig Dispense Auth. Provider   predniSONE  (DELTASONE ) 20 MG tablet Take 2 tablets (40 mg total) by mouth daily with breakfast. 6 tablet Stuart Vernell Norris, PA-C   tiZANidine  (ZANAFLEX ) 4 MG capsule Take 1 capsule (4 mg total) by mouth 3 (three) times daily as needed for muscle spasms. Do not drink alcohol or drive while taking this medication.  May cause drowsiness. 15 capsule Stuart Vernell Norris, PA-C   lidocaine  (LIDODERM ) 5 % Place 1 patch onto the skin daily. Remove & Discard patch within 12 hours or as directed by MD 30 patch Stuart Vernell Norris, PA-C      PDMP not reviewed this encounter.   Stuart Vernell Norris, [place] 11/13/23 2219

## 2024-05-17 ENCOUNTER — Emergency Department (HOSPITAL_COMMUNITY)
Admission: EM | Admit: 2024-05-17 | Discharge: 2024-05-17 | Disposition: A | Discharge status: Home / Self Care | Attending: Emergency Medicine | Admitting: Emergency Medicine

## 2024-05-17 ENCOUNTER — Emergency Department (HOSPITAL_COMMUNITY)

## 2024-05-17 DIAGNOSIS — R2242 Localized swelling, mass and lump, left lower limb: Secondary | ICD-10-CM | POA: Insufficient documentation

## 2024-05-17 DIAGNOSIS — M7989 Other specified soft tissue disorders: Secondary | ICD-10-CM

## 2024-05-17 NOTE — Discharge Instructions (Signed)
 You were evaluated in the emergency room for foot swelling.  You are medically cleared.

## 2024-05-17 NOTE — ED Provider Notes (Signed)
 " [place] EMERGENCY DEPARTMENT AT [facility] Provider Note   CSN: [id number] Arrival date & time: 05/17/24  1744     Patient presents with: Leg Swelling (Left foot) and Medical Clearance   [name] Young is a 34 y.o. female with history of alcohol abuse presents with complaints of left foot swelling.  States that it periodically swells up when she drinks alcohol.  She states that she recently drink alcohol.  Noticed a little bit of swelling earlier.  This has subsided.  No associated injury.  No pain.  No history of DVT or any risk factors.  States that she is attempting to check in for detox.  She was told that she had to come here to be medically cleared for detox.   HPI    Past Medical History:  Diagnosis Date   Depression    Hx MRSA infection    Pregnancy induced hypertension    Past Surgical History:  Procedure Laterality Date   CESAREAN SECTION N/A 02/17/2014   Procedure: CESAREAN SECTION;  Surgeon: Gloris DELENA Hugger, MD;  Location: [facility];  Service: Obstetrics;  Laterality: N/A;   CESAREAN SECTION N/A 01/08/2015   Procedure: CESAREAN SECTION;  Surgeon: Gloris DELENA Hugger, MD;  Location: [facility];  Service: Obstetrics;  Laterality: N/A;   CESAREAN SECTION N/A 12/05/2016   Procedure: CESAREAN SECTION;  Surgeon: Barbra Lang PARAS, DO;  Location: [facility] BIRTHING SUITES;  Service: Obstetrics;  Laterality: N/A;   CESAREAN SECTION N/A 11/27/2017   Procedure: CESAREAN SECTION;  Surgeon: Barbra Lang PARAS, DO;  Location: [facility] BIRTHING SUITES;  Service: Obstetrics;  Laterality: N/A;   CESAREAN SECTION N/A 03/31/2023   Procedure: CESAREAN SECTION;  Surgeon: Jayne Vonn DEL, MD;  Location: [facility];  Service: Obstetrics;  Laterality: N/A;    Prior to Admission medications  Medication Sig Start Date End Date Taking? Authorizing Provider  acetaminophen  (TYLENOL ) 500 MG tablet Take 2 tablets (1,000 mg total) by mouth every 8 (eight) hours as needed. 04/02/23   Erik Kieth BROCKS, MD  Blood  Pressure Monitoring (BLOOD PRESSURE CUFF) MISC 1 Device by Does not apply route once a week. 10/09/22   Ervin, Michael L, MD  citalopram  (CELEXA ) 20 MG tablet Take 0.5 tablets (10 mg total) by mouth daily for 7 days, THEN 1 tablet (20 mg total) daily for 21 days. 04/02/23 04/30/23  Erik Kieth BROCKS, MD  ferrous sulfate  325 (65 FE) MG tablet Take 1 tablet (325 mg total) by mouth every other day. 04/02/23   Erik Kieth BROCKS, MD  furosemide  (LASIX ) 20 MG tablet Take 1 tablet (20 mg total) by mouth 2 (two) times daily for 5 days. 04/02/23 04/07/23  Erik Kieth BROCKS, MD  lidocaine  (LIDODERM ) 5 % Place 1 patch onto the skin daily. Remove & Discard patch within 12 hours or as directed by MD 11/09/23   Stuart Vernell Norris, PA-C  NIFEdipine  (ADALAT  CC) 30 MG 24 hr tablet Take 1 tablet (30 mg total) by mouth daily. 04/02/23   Erik Kieth BROCKS, MD  oxyCODONE  (ROXICODONE ) 5 MG immediate release tablet Take 1 tablet (5 mg total) by mouth every 4 (four) hours as needed for breakthrough pain. 04/02/23   Erik Kieth BROCKS, MD  predniSONE  (DELTASONE ) 20 MG tablet Take 2 tablets (40 mg total) by mouth daily with breakfast. 11/09/23   Stuart Vernell Norris, PA-C  Prenatal Vit-Fe Fumarate-FA (PRENATAL MULTIVITAMIN) TABS tablet Take 1 tablet by mouth daily at 12 noon.    [provider]  senna-docusate (SENOKOT-S) 8.6-50 MG tablet Take 2 tablets by mouth at bedtime as needed for mild constipation. 04/02/23   Erik Kieth BROCKS, MD  tiZANidine  (ZANAFLEX ) 4 MG capsule Take 1 capsule (4 mg total) by mouth 3 (three) times daily as needed for muscle spasms. Do not drink alcohol or drive while taking this medication.  May cause drowsiness. 11/09/23   Stuart Vernell Norris, PA-C  valACYclovir (VALTREX) 500 MG tablet Take 500 mg by mouth 2 (two) times daily.    [provider]    Allergies: Ace inhibitors and Nsaids    Review of Systems  Musculoskeletal:  Negative for myalgias.    Updated Vital  Signs BP 120/78 (BP Location: Left Arm)   Pulse 80   Temp 98.4 F (36.9 C) (Oral)   Resp 18   Ht 5' 3 (1.6 m)   Wt 57.6 kg   SpO2 100%   BMI 22.50 kg/m   Physical Exam Vitals and nursing note reviewed.  Constitutional:      General: She is not in acute distress.    Appearance: She is well-developed.  HENT:     Head: Normocephalic and atraumatic.  Eyes:     Conjunctiva/sclera: Conjunctivae normal.  Cardiovascular:     Rate and Rhythm: Normal rate and regular rhythm.     Heart sounds: No murmur heard. Pulmonary:     Effort: Pulmonary effort is normal. No respiratory distress.     Breath sounds: Normal breath sounds.  Abdominal:     Palpations: Abdomen is soft.     Tenderness: There is no abdominal tenderness.  Musculoskeletal:        General: No swelling.     Cervical back: Neck supple.     Comments: No swelling appreciated, no point of tenderness, no erythema, ecchymosis.  Compartments are soft, DP/PT pulses 2+, tolerates full range of motion without discomfort  Skin:    General: Skin is warm and dry.     Capillary Refill: Capillary refill takes less than 2 seconds.  Neurological:     Mental Status: She is alert.  Psychiatric:        Mood and Affect: Mood normal.     (all labs ordered are listed, but only abnormal results are displayed) Labs Reviewed - No data to display  EKG: None  Radiology: DG Foot 2 Views Left Result Date: 05/17/2024 EXAM: 2 VIEW(S) XRAY OF THE LEFT FOOT 05/17/2024 06:48:00 PM COMPARISON: None available. CLINICAL HISTORY: foot swelling FINDINGS: BONES AND JOINTS: No acute fracture. No malalignment. SOFT TISSUES: Unremarkable. IMPRESSION: 1. No acute findings. Electronically signed by: Pinkie Pebbles MD 05/17/2024 06:55 PM EST RP Workstation: [id number]     Procedures   Medications Ordered in the ED - No data to display  Clinical Course as of 05/17/24 2013  Sat May 17, 2024  2008 Patient with history of alcohol abuse evaluated for  concerns for left lower extremity swelling over the dorsum of her foot.  States that she noticed earlier this morning.  Is very typical for her to experience transient swelling.  Her swelling has resolved at this time.  She has no associated pain there is no injury.  She is hemodynamically stable.  Her exam is entirely benign without any tenderness or swelling.  X-rays ordered in triage without any acute abnormality.  Ultimately she is medically cleared. [JT]    Clinical Course User Index [JT] Donnajean Lynwood DEL, PA-C  Medical Decision Making Amount and/or Complexity of Data Reviewed Radiology: ordered.   This patient presents to the ED with chief complaint(s) of swelling .  The complaint involves an extensive differential diagnosis and also carries with it a high risk of complications and morbidity.   Pertinent past medical history as listed in HPI  The differential diagnosis includes  Based off exam and history do not suspect DVT, fracture, dislocation, septic joint, gout, cellulitis, fluid overload Additional history obtained: Records reviewed Care Everywhere/External Records  Disposition:   Patient will be discharged home. The patient has been appropriately medically screened and/or stabilized in the ED. I have low suspicion for any other emergent medical condition which would require further screening, evaluation or treatment in the ED or require inpatient management. At time of discharge the patient is hemodynamically stable and in no acute distress. I have discussed work-up results and diagnosis with patient and answered all questions. Patient is agreeable with discharge plan. We discussed strict return precautions for returning to the emergency department and they verbalized understanding.     Social Determinants of Health:   none  This note was dictated with voice recognition software.  Despite best efforts at proofreading, errors may have occurred  which can change the documentation meaning.       Final diagnoses:  Foot swelling    ED Discharge Orders     None          Donnajean Lynwood VEAR DEVONNA 05/17/24 2013  "

## 2024-05-17 NOTE — ED Triage Notes (Signed)
 Pt comes in for left foot swelling, denies any injury.   Pt's foot is not longer swollen, she just needs to be cleared to go back to the detox facility.   Pt is in-pt facility for detox. Pt told the staff her s/s and they told her to come here so she can be cleared to come back to facility.    Pt has a hx of HTN
